# Patient Record
Sex: Male | Born: 1935
Health system: Southern US, Community
[De-identification: ages and names within clinical notes are randomized; demographics above are authoritative.]

## PROBLEM LIST (undated history)

## (undated) DIAGNOSIS — K219 Gastro-esophageal reflux disease without esophagitis: Secondary | ICD-10-CM

## (undated) DIAGNOSIS — I209 Angina pectoris, unspecified: Secondary | ICD-10-CM

## (undated) DIAGNOSIS — E785 Hyperlipidemia, unspecified: Secondary | ICD-10-CM

## (undated) DIAGNOSIS — K922 Gastrointestinal hemorrhage, unspecified: Secondary | ICD-10-CM

## (undated) DIAGNOSIS — I10 Essential (primary) hypertension: Secondary | ICD-10-CM

## (undated) DIAGNOSIS — R0609 Other forms of dyspnea: Secondary | ICD-10-CM

## (undated) DIAGNOSIS — M1990 Unspecified osteoarthritis, unspecified site: Secondary | ICD-10-CM

## (undated) DIAGNOSIS — J189 Pneumonia, unspecified organism: Secondary | ICD-10-CM

## (undated) DIAGNOSIS — I6529 Occlusion and stenosis of unspecified carotid artery: Secondary | ICD-10-CM

## (undated) DIAGNOSIS — N400 Enlarged prostate without lower urinary tract symptoms: Secondary | ICD-10-CM

## (undated) DIAGNOSIS — K5790 Diverticulosis of intestine, part unspecified, without perforation or abscess without bleeding: Secondary | ICD-10-CM

## (undated) DIAGNOSIS — I2510 Atherosclerotic heart disease of native coronary artery without angina pectoris: Secondary | ICD-10-CM

## (undated) HISTORY — PX: COLONOSCOPY: SHX174

## (undated) HISTORY — DX: Atherosclerotic heart disease of native coronary artery without angina pectoris: I25.10

## (undated) HISTORY — DX: Occlusion and stenosis of unspecified carotid artery: I65.29

## (undated) HISTORY — DX: Benign prostatic hyperplasia without lower urinary tract symptoms: N40.0

## (undated) HISTORY — DX: Gastro-esophageal reflux disease without esophagitis: K21.9

## (undated) HISTORY — DX: Unspecified osteoarthritis, unspecified site: M19.90

## (undated) HISTORY — PX: TRANSURETHRAL RESECTION OF PROSTATE: SHX73

## (undated) HISTORY — DX: Hyperlipidemia, unspecified: E78.5

## (undated) HISTORY — DX: Essential (primary) hypertension: I10

## (undated) HISTORY — PX: CYSTECTOMY: SUR359

## (undated) HISTORY — DX: Angina pectoris, unspecified: I20.9

---
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
## 1999-05-02 ENCOUNTER — Encounter: Payer: Self-pay | Admitting: Family Medicine

## 1999-05-02 ENCOUNTER — Encounter: Admission: RE | Admit: 1999-05-02 | Discharge: 1999-05-02 | Payer: Self-pay | Admitting: Family Medicine

## 2003-03-08 ENCOUNTER — Inpatient Hospital Stay (HOSPITAL_COMMUNITY): Admission: EM | Admit: 2003-03-08 | Discharge: 2003-03-13 | Payer: Self-pay | Admitting: Emergency Medicine

## 2005-04-07 ENCOUNTER — Ambulatory Visit: Payer: Self-pay | Admitting: Gastroenterology

## 2005-04-22 ENCOUNTER — Ambulatory Visit: Payer: Self-pay | Admitting: Gastroenterology

## 2005-04-22 ENCOUNTER — Encounter (INDEPENDENT_AMBULATORY_CARE_PROVIDER_SITE_OTHER): Payer: Self-pay | Admitting: Specialist

## 2006-04-06 ENCOUNTER — Ambulatory Visit: Payer: Self-pay | Admitting: *Deleted

## 2007-05-16 ENCOUNTER — Ambulatory Visit: Payer: Self-pay | Admitting: Cardiology

## 2007-05-20 ENCOUNTER — Encounter (INDEPENDENT_AMBULATORY_CARE_PROVIDER_SITE_OTHER): Payer: Self-pay | Admitting: Urology

## 2007-05-20 ENCOUNTER — Inpatient Hospital Stay (HOSPITAL_COMMUNITY): Admission: RE | Admit: 2007-05-20 | Discharge: 2007-05-22 | Payer: Self-pay | Admitting: Urology

## 2009-02-15 ENCOUNTER — Emergency Department (HOSPITAL_COMMUNITY): Admission: EM | Admit: 2009-02-15 | Discharge: 2009-02-15 | Payer: Self-pay | Admitting: Emergency Medicine

## 2010-03-25 ENCOUNTER — Encounter: Payer: Self-pay | Admitting: Gastroenterology

## 2010-04-03 NOTE — Letter (Signed)
Summary: Colonoscopy Letter  [facility]  [address] [place], [place] [address]   Phone: [phone]  Fax: [phone]      March 25, 2010 MRN: [id number]   [name] [address] [place], [place]  [address]   Dear Mr. Dwiggins,   According to your medical record, it is time for you to schedule a Colonoscopy. The American Cancer Society recommends this procedure as a method to detect early colon cancer. Patients with a family history of colon cancer, or a personal history of colon polyps or inflammatory bowel disease are at increased risk.  This letter has been generated based on the recommendations made at the time of your procedure. If you feel that in your particular situation this may no longer apply, please contact our office.  Please call our office at [phone] to schedule this appointment or to update your records at your earliest convenience.  Thank you for cooperating with [place] to provide you with the very best care possible.   Sincerely,  Judie Petit T. Russella Dar, M.D.  [facility] Gastro[facility]ology Division [phone]

## 2010-07-15 NOTE — Assessment & Plan Note (Signed)
[facility]                            CARDIOLOGY OFFICE NOTE   NAME:Kenneth, [name]                          MRN:          [id number]  DATE:05/16/2007                            DOB:          [birth year]    PRIMARY:  Mosetta Putt, M.D.   REASON FOR REFERRAL:  Preoperative evaluation in a patient with known  coronary disease.   HISTORY OF PRESENT ILLNESS:  The patient is a pleasant 78 year old  gentleman who had angina back in 1980.  He was cathed in [place].  He  had luminal irregularities in the LAD and circumflex, but he had a  totally occluded right coronary artery.  This was a dominant vessel with  some collaterals.  He had a well-preserved ejection fraction.  Over the  years, he has been managed medically, and has had no further angina by  his report.  He walks two miles briskly each day.  He may have a little  dyspneic climbing up hills, but this is not new.  He has no chest  pressure, neck, or arm discomfort.  He has no resting shortness of  breath, PND, or orthopnea.  He has had no palpitations, presyncope, or  syncope.  He has had an excellent exercise tolerance.  He said he had a  stress test in the mid-1980s, but has not required any other testing  since then.  He has his risk factors modified by Dr. Duaine Dredge.   The patient is having problems urinating.  He currently has an  indwelling Foley, and is due to have an apparent TURP done by Dr. Earlene Plater.   PAST MEDICAL HISTORY:  1. Hypertension times 20-30 years.  2. Hyperlipidemia.  3. Gout.  4. Traumatic eye injury as a child.  5. Mild bilateral carotid stenosis (20%-39% bilateral).   PAST SURGICAL HISTORY:  1. Eye surgeries.  2. Cyst removed from his neck.   ALLERGIES:  None.   MEDICATIONS:  1. Xalatan 0.005.  2. Finasteride 5 mg nightly.  3. Flomax 0.4 mg nightly.  4. Simvastatin 80 mg nightly.  5. Lisinopril 20 mg daily.  6. Allopurinol 300 mg daily.  7. Fluticasone 50 mcg  b.i.d.  8. Nitrofurantoin.   SOCIAL HISTORY:  The patient is retired.  He is married.  He has one son  and two stepchildren.  He does not smoke cigarettes.  He rarely drinks  alcohol.   FAMILY HISTORY:  Noncontributory for early coronary artery disease.   REVIEW OF SYSTEMS:  As stated in the HPI positive for occasional  diarrhea, occasional leg cramps, joint pains in his knees.  Negative for  all other systems.   PHYSICAL EXAMINATION:  The patient is in no distress.  Blood pressure 161/88, heart rate 61 and regular.  HEENT:  Eyelids are unremarkable.  The left pupil is round and reactive.  The right eye is status post from trauma with chronic changes.  Left  fundi is unremarkable.  Oral mucosa unremarkable.  NECK:  No jugular distention at 45 degrees.  Carotid upstroke brisk and  symmetrical.  No bruits, thyromegaly.  LYMPHATICS:  No cervical, axillary, or inguinal adenopathy.  LUNGS:  Clear to auscultation bilaterally.  BACK:  No costovertebral tenderness.  CHEST:  Unremarkable.  HEART:  PMI not displaced or sustained, S1-S2 within normal limits, no  S3, no S4, no clicks, no rubs, no murmurs.  ABDOMEN:  Flat, positive bowel sounds.  Normal in frequency and pitch.  No bruits, rebound, guarding or midline pulsatile mass.  No hepatomegaly  or splenomegaly.  SKIN:  No rashes, no nodules.  EXTREMITIES:  2+ pulses throughout, no edema, no cyanosis or clubbing.  NEURO:  Oriented to person, place, and time.  Cranial nerves II-XII  grossly intact, motor grossly intact.   EKG sinus bradycardia, rate 57, axis within normal limits, intervals  within normal limits, no acute ST-T wave changes.   ASSESSMENT AND PLAN:  1. Coronary artery disease.  The patient has coronary disease as      described.  He is very active.  He has no symptoms.  He is      participating in secondary risk reduction.  No further      cardiovascular testing is suggested.  I did discuss with him the      fact that  if he ever has any chest discomfort, decreased exercise      tolerance, dyspnea, or other even mildly worrisome symptoms; I      would have a low threshold for stress testing.  I will defer this      to Dr. Duaine Dredge.  2. Preoperative clearance.  Based on ACC/AHA guidelines, the patient      is an acceptable risk for the planned procedure.  This is a low-      risk procedure.  He has no high risk features.  He has a very high      functional level (much greater than 5 METS).  He can proceed to      surgery without further evaluation.  There would be no clear      advantage to adding a beta blocker, at this point, according to      most recent data.  3. Hypertension.  Blood pressure is slightly elevated here; however,      he takes it at home and it is always in the 120s/70s.  It is      controlled per Dr. Geoffery Lyons notes.  I will make no changes to his      medications.  4. Dyslipidemia per Dr. Duaine Dredge.  He had an excellent lipid profile      most recently.  5. Followup.  I think that he can come back to this clinic as needed      with the low threshold for stress testing as indicated above.     Rollene Rotunda, MD, [facility]  Electronically Signed    JH/MedQ  DD: 05/16/2007  DT: 05/16/2007  Job #: [id number]   cc:   Mosetta Putt, M.D.  Ronald L. Earlene Plater, M.D.

## 2010-07-15 NOTE — Op Note (Signed)
[name], [name]                 ACCOUNT NO.:  [id number]   MEDICAL RECORD NO.:  [id number]          PATIENT TYPE:  INP   LOCATION:  0005                         FACILITY:  [facility]   PHYSICIAN:  Ronald L. Earlene Plater, M.D.  DATE OF BIRTH:  06/08/1930   DATE OF PROCEDURE:  05/20/2007  DATE OF DISCHARGE:                               OPERATIVE REPORT   PREOPERATIVE DIAGNOSIS:  Benign prostatic hyperplasia with urinary  obstruction/retention.   POSTOPERATIVE DIAGNOSIS:  Benign prostatic hyperplasia with urinary  obstruction/retention.   PROCEDURE:  1. Cystourethroscopy.  2. Transurethral resection of the prostate.   ATTENDING PHYSICIAN:  Dr. Gaynelle Arabian.   RESIDENT PHYSICIAN:  Dr. Nira Conn.   ANESTHESIA:  General.   INDICATIONS FOR PROCEDURE:  Mr. Mcnay is a 77 year old, white male with  known benign prostatic hyperplasia.  He has failed medical management  with Flomax and finasteride. He has experienced acute urinary retention  and failed multiple voiding trials. He has seen Dr. Lorin Picket MacDiarmid in  a preoperative consultation referred by Dr. Earlene Plater and they have agreed  that Mr. Arabie may benefit from TURP.  This was discussed with Mr. Corsino  at great length. The risks, consequences and benefits were reviewed and  informed consent was obtained preoperatively.   DESCRIPTION OF PROCEDURE:  The patient was brought to the operating  room, placed in the supine position.  He was correctly identified by his  wristband and an appropriate time-out was taken. IV antibiotics were  delivered.  General anesthesia was delivered. He was adequately  anesthetized. He was placed in the dorsal lithotomy position with great  care taken to minimize the risk of peripheral neuropathy or compartment  syndrome.  His perineum was prepped and draped sterilely.  We began our  procedure by performing rigid cystourethroscopy with a 22-French rigid  cystoscopic sheath and a 12 degree lens.  The anterior  urethra was  normal in course and caliber. Upon entering into the prostatic urethra,  bilobar lateral lobe enlargement was noted with the gland coapting in  the midline. The mucosa was slightly erythematous secondary to previous  indwelling catheter. Upon entering the bladder, clear urine was  identified. His bladder was trabeculated but no obvious diverticula were  seen.  Ureteral orifices were noted to be in their normal anatomic  position effluxing clear urine.  He did have a small hint of a median  lobe but nothing significant.  Likewise the cystoscope was removed and  we performed urethral dilation using the [employer] sounds and  sequentially dilated him up to a 30-French.  Once adequately dilated, we  then placed our 24-French resectoscope with loop into the bladder.  Glycine was our irrigant.  We backed the resectoscope back to the  verumontanum and did a comprehensive evaluation of the prostate.  The  verumontanum was easily identified. We began our resection by taking  down the small median lobe and resecting a trough at the 6 o'clock  position.  This was resected down to the level of the capsule.  We then  identified a small lip of tissue  in the 12 o'clock position and this was  also resected.  Both lateral lobes were released with resection troughs  in the 1 and 11 o'clock positions.  We then performed systematic  resection circumferentially of the patient's gland beginning on his left  and then moving to his right.  All adenomatous material was resected  down to the level of the prostatic capsule.  All bleeding areas were  fulgurated.  Once the resection was complete, we irrigated his bladder  free of prostate chips, inspected his bladder. Both ureteral orifices  continued to efflux clear urine and they were not injured in the  resection. The resectoscope was backed in the prostatic fossa. There did  appear to be a very minor perforation in the 10 o'clock position.  All   other bleeding areas were fulgurated.  Final inspection did not reveal  any remaining prostatic chips and we subsequently removed the  resectoscope.  We placed a 26-French three-way catheter into the  patient's bladder, inflated the balloon with 35 mL of sterile water.  We  then placed him on traction and hand irrigated him until clear.  He did  have some bleeding prior to the traction being placed but he readily  cleared with the traction.  Continuous bladder irrigation was started in  the operating room.  This marked the end of our procedure.  Anesthesia  was reversed.  He was extubated and taken to the recovery room in stable  condition.  There were no complications.  Estimated blood loss was 100  mL. Resection time was 30 minutes. Note Dr. Earlene Plater was present and  participated in all aspects of the case.     ______________________________  Nira Conn, Resident      Lucrezia Starch. Earlene Plater, M.D.  Electronically Signed    DW/MEDQ  D:  05/20/2007  T:  05/20/2007  Job:  [id number]

## 2010-07-18 NOTE — H&P (Signed)
NAMEMALIKHI, OGAN NO.:  [id number]   MEDICAL RECORD NO.:  [id number]                   PATIENT TYPE:  EMS   LOCATION:  ED                                   [facility]ACILITY[facility]  [facility]   PHYSICIAN:  Renato Battles, M.D.                  DATE OF BIRTH:  03-Jan-1931   DATE OF ADMISSION:  03/08/2003  DATE OF DISCHARGE:                                HISTORY & PHYSICAL   PRIMARY CARE PHYSICIAN:  Mosetta Putt, M.D.   REASON FOR ADMISSION:  Dysuria.   HISTORY OF PRESENT ILLNESS:  The patient is a pleasant 77 year old white  male with a 48-hour history of urinary retention, dysuria, and change in  urine color, decreased appetite, and also reported 24-hour history of fever  up to 104, hematuria, and diarrhea. The patient denies cough or shortness of  breath.   REVIEW OF SYSTEMS:  CONSTITUTIONAL: Positive for fever. No chills, no  nightsweats. Positive for decreased appetite, no weight changes.  GI: No  nausea, vomiting. No constipation. Positive for diarrhea. CARDIOPULMONARY:  No chest pain, shortness of breath. No PND or orthopnea. No cough.  GU:  Positive for dysuria, hematuria, and urinary retention.   PAST MEDICAL HISTORY:  1. Hypertension.  2. Gastroesophageal reflux disorder.  3. Hypercholesterolemia.  4. Gout.  5. Coronary artery disease.  6. History of prostatitis and BPH.   PAST SURGICAL HISTORY:  1. 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  2. Cardiac catheterization in the 1980s showed obstruction, 90% of one     artery which the patient does not recall. However, he was told then that     he has good collaterals and does not need any treatment.   FAMILY HISTORY:  Positive for prostate cancer.   SOCIAL HISTORY:  No tobacco, alcohol, or drugs. He lives with his wife.   ALLERGIES:  No known drug allergies.   HOME MEDICATIONS:  1. Lisinopril 20 mg p.o. daily.  2. Lipitor 20 mg p.o. daily.  3. Chlortalidone 25 mg p.o.  daily.  4. Allopurinol 300 mg p.o. daily.  5. Zantac 150 mg p.o. b.i.d.  6. Flonase.   PHYSICAL EXAMINATION:  GENERAL: The patient is alert and oriented times  three, in no acute distress.  VITAL SIGNS: Temperature 100.7, heart rate 120, respiratory rate 20, blood  pressure 80/43.  HEENT: The head is normocephalic and atraumatic. The left pupil is reactive  to light and accommodation. The right eye is blind. The patient has very dry  mucous membranes.  NECK: No lymphadenopathy, thyromegaly, and no jugular venous distention.  CHEST: Clear to auscultation bilaterally; however, there are decreased  breath sounds in the right lower lobe posteriorly.  ABDOMEN: Soft, nontender, and nondistended.  Normoactive bowel sounds.  EXTREMITIES: No clubbing, cyanosis, or edema.   STUDIES:  CBC shows white count 22.2 with 91% neutrophils, hemoglobin and  platelets normal. Electrolytes show low potassium of 2.9, elevated BUN at 26  and creatinine 2.1; otherwise insignificant. Urinalysis shows large white  cells, negative nitrites, and large blood.   ASSESSMENT/PLAN:  1. Urinary tract infection. I do not believe at this point that the patient     is uroseptic. He appears to be very stable. Blood pressure is low     probably secondary to acute renal failure and continuation of     antihypertensives in addition to dehydration. I am going to start the     patient on Rocephin. Blood culture and urine culture are being done and     they are pending.  2. Acute renal failure. This is likely secondary to dehydration, less likely     secondary to obstruction. The patient also received a Foley catheter. We     are starting him on IV fluids. Creatinine clearance at this point     calculate at 38.  3. Hypertension. Currently, the patient is hypotensive. I am going to hold     the medication.  4. Gastroesophageal reflux disorder. The patient will be started on Protonix     p.o.  5. Gout. I am going to continue  the patient on allopurinol. However, because     of acute renal failure I am going to decrease the dose from 300 to 150     daily.  6. Hypercholesterolemia.  Continue the patient on Lipitor.  7. Family history of prostate cancer. I am going to check PSA.  8. Hypokalemia. The patient will receive modest dose of potassium in his IV     fluids.                                               Renato Battles, M.D.    SA/MEDQ  D:  03/08/2003  T:  03/08/2003  Job:  [id number]

## 2010-07-18 NOTE — Discharge Summary (Signed)
[name], [name]                             ACCOUNT NO.:  [id number]   MEDICAL RECORD NO.:  [id number]                   PATIENT TYPE:  INP   LOCATION:  0478                                 FACILITY:  [facility]   PHYSICIAN:  Elliot Cousin, M.D.                 DATE OF BIRTH:  [birth year]   DATE OF ADMISSION:  03/08/2003  DATE OF DISCHARGE:  03/13/2003                                 DISCHARGE SUMMARY   PRIMARY DISCHARGE DIAGNOSES:  1. Escherichia coli urinary tract infection with sepsis.  2. Escherichia coli bacteremia.  3. Urinary retention.  4. Elevated prostate-specific antigen.  5. Elevated liver transaminases.  6. Acute renal insufficiency, nonoliguric.   SECONDARY DISCHARGE DIAGNOSES:  1. History of hypertension.  2. Gastroesophageal reflux disease.  3. Hyperlipidemia.  4. Gout.  5. Coronary artery disease.  6. History of prostatitis and benign prostatic hypertrophy.  7. 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  8. Cardiac catheterization in the 1980s, showing a 90% stenosis of one     artery, which the patient did not recall.  However, he was told that he     had good collaterals and did not need angioplasty.   DISCHARGE MEDICATIONS:  1. Hold lisinopril for 1 week.  2. Resume Lipitor 20 mg q.h.s. in 1 week.  3. Allopurinol 300 mg daily.  4. Zantac 150 mg b.i.d.  5. Flonase 1 spray in each nostril b.i.d.  6. Cipro 500 mg b.i.d. x1 week.  7. Flomax 0.4 mg daily.   DISCHARGE DISPOSITION:  [name] was discharged to home on March 13, 2003, in improved and stable condition.  He was advised to follow up with  his primary care physician, Dr. Duaine Dredge, on Friday, March 16, 2003, at  4:30 p.m.   CONSULTATIONS:  Dr. Gaynelle Arabian.   PROCEDURES PERFORMED:  1. Renal ultrasound on March 09, 2003.  The results revealed small     bilateral simple renal cysts.  Empty urinary bladder.  No acute     abnormalities.  Right  kidney measuring 12.2 cm in length and the left     kidney measuring 12 cm in length.  2. Ultrasound of the abdomen on March 13, 2003.  The results revealed no     evidence of biliary or hepatic disease.   HISTORY OF PRESENT ILLNESS:  [name] is a 77 year old man with a past  medical history significant for prostatitis and BPH who presented to the  emergency department with a 48-hour history of urinary retention, painful  urination, a change in urine color, decreased appetite, and fever up to 104.  The patient also had evidence of gross hematuria and several episodes of  diarrhea.  The patient was subsequently admitted for evaluation and  treatment of urinary tract infection, possibly pyelonephritis versus UTI  with sepsis.  HOSPITAL COURSE:  #1 - ESCHERICHIA COLI URINARY TRACT INFECTION WITH SEPSIS  AND WITH ESCHERICHIA COLI BACTEREMIA:  The initial management included  volume repletion with D-5 normal saline at 100 mL an hour, treatment with  Rocephin at 1 g IV q.24h., obtaining culture and sensitivities on the urine,  obtaining a PSA, obtaining blood cultures, and obtaining a renal ultrasound.  The patient's blood work on admission was significant for a WBC of 22.2  thousand, hemoglobin of 13.4, platelets of 147, an absolute neutrophil count  of 20.2.  Potassium 2.9, sodium 135, CO2 of 28, chloride of 103, creatinine  of 2.1, BUN of 26, glucose of 116, calcium of 7.8.  AST of 30, ALT of 15,  alkaline phosphatase of 68, total bilirubin of 0.9.  The follow-up  laboratory results were significant for an elevated PSA of 102.3 with a free  PSA of 49.1, significantly elevated.  The patient's initial urinalysis  revealed large hemoglobin, trace of ketones, positive nitrites, large  leukocyte esterase, and many bacteria.  The patient was febrile with a  temperature of 101.6 in the emergency department.   On hospital day #2 Cipro at 400 mg IV q.12h. was added to Rocephin.  The  patient  quickly became afebrile on hospital day #2.  His urine culture  became positive for E. coli.  Shortly thereafter the blood cultures became  positive for E. coli.  On hospital day #3 the patient became febrile again.  The Foley catheter which was inserted on admission was discontinued because  it was thought to be a continuous nidus for infection.  Urinary retention  was a concern, and his urine output was monitored following the  discontinuation of the Foley.  The patient's blood pressures were low to  normal systolically during the hospital course.  The patient has a history  of hypertension; however, he was relatively hypotensive, which was felt to  be secondary to the overwhelming infection.  His blood pressure medications  were held during the hospital course.  The patient's thrombocytopenia was  thought to be secondary to the acute infection as well.  He became  symptomatically much improved following the addition of ciprofloxacin to the  antibiotic regimen.  His appetite improved, and he began drinking more  fluids.  Therefore, the IV fluids were discontinued on hospital day #3.  Dr.  Earlene Plater, urologist, was consulted for further evaluation and management.  He  agreed with the current medical management.  He felt that the patient's  elevated PSA was probably secondary to the urinary tract infection and the  Foley catheter.  He recommended following the PSA as an outpatient.   The patient's renal function was followed during the hospitalization very  closely.  The patient had no past medical history significant for renal  insufficiency.  However, on admission his creatinine was 2.1, and his BUN  was 26.  Following treatment with antibiotics and intravenous fluids, the  creatinine normalized to 1, and the BUN normalized to 11.  The platelet  count returned to within normal limits at 151,000 as well.  He was repleted with potassium chloride intravenously and by mouth during the hospital   course.  The potassium, which was 2.9 on admission, improved to 4.1 prior to  hospital discharge.  The patient had an excellent urinary output during the  entire hospitalization.  There were no signs of oliguria or anuria.  The  patient demonstrated no urinary retention following the discontinuation of  the Foley catheter.  The intravenous Rocephin and the intravenous Cipro were  eventually discontinued.  The patient was subsequently sent home on  ciprofloxacin 500 mg p.o. b.i.d.  He will continue this regimen for an  additional 7 days.  He was advised to follow up with his primary care  physician, Dr. Duaine Dredge, in 1-2 weeks and with his urologist as needed.  Prior to hospital discharge the patient's white blood cell count  dramatically improved from greater than 22,000 to 5700 at the time of  discharge.   #2 - ELEVATED LIVER TRANSAMINASES:  The patient's liver transaminases were  within normal limits on admission with an AST of 30, ALT of 15, alkaline  phosphatase 68, total bilirubin 0.9.  However, following treatment with  Rocephin and ciprofloxacin the AST increased to 53, and the ALT increased to  44.  The alkaline phosphatase remained within normal limits at 117 as well  as the total bilirubin at 0.4.  An ultrasound of the abdomen was ordered for  further evaluation of the right upper quadrant.  The ultrasound was  essentially negative with no evidence of biliary or hepatic disease.  The  mild increases in the levels of ALT and AST were attributed to antibiotic  therapy and possibly Lipitor.  He had no complaints of right upper quadrant  abdominal pain during the exam.   DISCHARGE LABORATORIES:  WBC 5.7, hemoglobin 13.5, hematocrit 40.4, MCV  93.3, platelets 151.  Sodium 135, potassium 4.1, chloride 103, CO2 27,  glucose 130, BUN 11, creatinine 1, calcium 8.3, total protein 5.7, albumin  2.7, AST 53, ALT 44, alkaline phosphatase 117, total bilirubin 0.4,  magnesium 1.8, total  cholesterol 98, HDL cholesterol 32, LDL cholesterol 51.                                               Elliot Cousin, M.D.    DF/MEDQ  D:  03/19/2003  T:  03/20/2003  Job:  [id number]   cc:   Mosetta Putt, M.D.  [address] [place]  [place] [address]  Fax: [phone]   Lucrezia Starch. Ovidio Hanger, M.D.  [address]  [place]  [place] [address]  Fax: [phone]

## 2010-07-18 NOTE — Discharge Summary (Signed)
NAMEPROPHET, RENWICK                 ACCOUNT NO.:  [id number]   MEDICAL RECORD NO.:  [id number]          PATIENT TYPE:  INP   LOCATION:  1413                         FACILITY:  [facility]   PHYSICIAN:  Ronald L. Earlene Plater, M.D.  DATE OF BIRTH:  Mar 12, 1930   DATE OF ADMISSION:  05/20/2007  DATE OF DISCHARGE:  05/22/2007                               DISCHARGE SUMMARY   ADMISSION DIAGNOSES:  1. Benign prostatic hypertrophy with urinary obstruction.  2. Retention of urine.   DISCHARGE DIAGNOSIS:  Status post transurethral prostatectomy.   BRIEF HISTORY:  Mr. Casasola is a 77 year old male who is currently Foley  dependent.  He did undergo urodynamic study and was also seen in  consultation by Dr. Sherron Monday.  It was felt that the best possibility  for urinating on his own without catheterization or tube dependency was  a TURP.  After a lengthy discussion with Dr. Darvin Neighbours, and understanding the  risks, benefits, and alternatives, he elected to proceed with cystoscopy  and TURP.  The surgery will be cleared with Dr. Duaine Dredge.   PAST MEDICAL HISTORY:  1. Hypertension.  2. Glaucoma.  3. Coronary artery disease.   ALLERGIES:  None known.   HOSPITAL COURSE:  On May 20, 2007, Mr. Midgley was electively admitted  to [facility] under the care of Dr. Gaynelle Arabian.  He  underwent the following surgical procedures, cysto and TURP.  He  tolerated the procedure well, transferring in stable condition to the  PACU.  He was extubated immediately following surgery and awoke from  surgery neurologically intact.    Postoperatively, the patient's vital signs remained stable and afebrile.  His urine cleared.  His abdomen was soft.  The Foley catheter was  discontinued on May 22, 2007.  The patient was discharged home.   LABORATORY STUDIES:  White count 6.3, hemoglobin 13.3, hematocrit 39.3.  Sodium 132, potassium 3.9, CO2 99, chloride 29, glucose 116, BUN 9,  creatinine 0.87.   DISCHARGE  MEDICATIONS:  1. Cipro.  2. Vicodin p.r.n.  3. Xalatan one drop left eye at bedtime.  4. Istalol one drop left eye every morning.  5. Finasteride 5 mg nightly.  6. Flomax 0.4 mg nightly.  7. Simvastatin 80 mg one daily.  8. Lisinopril 20 mg daily.  9. Allopurinol 300 mg daily.  10.Fluocinonide topical for psoriasis twice daily.  11.Fluticasone twice daily as needed and one spray in each nostril.   ACTIVITY:  Increase slowly, may go up steps, no lifting greater than 5  pounds for 3 weeks, may shower, no driving for 3 weeks, no sexual  activity for 3 weeks.   DIET:  Low sodium, heart healthy.   FOLLOWUPAlessandra Bevels. Lineberry, FNP-C in 2 weeks.     ______________________________  Alessandra Bevels. Chase Picket, FNP-C      Ronald L. Earlene Plater, M.D.  Electronically Signed    JML/MEDQ  D:  06/09/2007  T:  06/09/2007  Job:  [id number]

## 2010-09-25 ENCOUNTER — Ambulatory Visit (AMBULATORY_SURGERY_CENTER): Payer: Medicare Other | Admitting: *Deleted

## 2010-09-25 DIAGNOSIS — Z86010 Personal history of colonic polyps: Secondary | ICD-10-CM

## 2010-09-25 DIAGNOSIS — Z1211 Encounter for screening for malignant neoplasm of colon: Secondary | ICD-10-CM

## 2010-09-25 MED ORDER — PEG-KCL-NACL-NASULF-NA ASC-C 100 G PO SOLR: 1.0000 | Freq: Once | ORAL | Status: DC

## 2010-09-25 NOTE — Patient Instructions (Signed)
Please review instructions  Pick up moviprep at [facility] on [address]

## 2010-10-03 ENCOUNTER — Encounter: Payer: Self-pay | Admitting: Gastroenterology

## 2010-10-03 ENCOUNTER — Ambulatory Visit (AMBULATORY_SURGERY_CENTER): Payer: Medicare Other | Admitting: Gastroenterology

## 2010-10-03 DIAGNOSIS — Z1211 Encounter for screening for malignant neoplasm of colon: Secondary | ICD-10-CM

## 2010-10-03 DIAGNOSIS — Z860100 Personal history of colon polyps, unspecified: Secondary | ICD-10-CM

## 2010-10-03 MED ORDER — SODIUM CHLORIDE 0.9 % IV SOLN: 500.0000 mL | INTRAVENOUS | Status: DC

## 2010-10-03 NOTE — Patient Instructions (Signed)
Please refer to your blue and neon green sheets for instructions regarding diet and activity for the rest of today.  You may resume your medications as you would normally take them.  

## 2010-10-06 ENCOUNTER — Telehealth: Payer: Self-pay | Admitting: *Deleted

## 2010-10-06 NOTE — Telephone Encounter (Signed)
Message left

## 2010-11-24 LAB — COMPREHENSIVE METABOLIC PANEL
Albumin: 4
Alkaline Phosphatase: 86
BUN: 12
CO2: 27
Chloride: 103
GFR calc non Af Amer: >60
Potassium: 3.4 — ABNORMAL LOW
Total Bilirubin: 0.8

## 2010-11-24 LAB — URINALYSIS, ROUTINE W REFLEX MICROSCOPIC
Bilirubin Urine: NEGATIVE
Hgb urine dipstick: NEGATIVE
Ketones, ur: NEGATIVE
Specific Gravity, Urine: 1.009
pH: 7.5

## 2010-11-24 LAB — BASIC METABOLIC PANEL
Calcium: 8.3 — ABNORMAL LOW
GFR calc Af Amer: >60
GFR calc non Af Amer: >60
Sodium: 132 — ABNORMAL LOW

## 2010-11-24 LAB — URINE MICROSCOPIC-ADD ON

## 2010-11-24 LAB — CBC
HCT: 43.8
Hemoglobin: 15
Hemoglobin: 13.3
MCV: 92.3
Platelets: 168
RBC: 4.74
RBC: 4.23
WBC: 6.1
WBC: 6.3

## 2010-11-24 LAB — PROTIME-INR: INR: 1

## 2010-12-01 DIAGNOSIS — I209 Angina pectoris, unspecified: Secondary | ICD-10-CM

## 2010-12-01 HISTORY — PX: CORONARY ANGIOPLASTY WITH STENT PLACEMENT: SHX49

## 2010-12-01 HISTORY — DX: Angina pectoris, unspecified: I20.9

## 2010-12-16 ENCOUNTER — Encounter: Payer: Self-pay | Admitting: Cardiology

## 2010-12-17 ENCOUNTER — Encounter: Payer: Self-pay | Admitting: *Deleted

## 2010-12-17 ENCOUNTER — Encounter: Payer: Self-pay | Admitting: Cardiology

## 2010-12-17 ENCOUNTER — Ambulatory Visit (INDEPENDENT_AMBULATORY_CARE_PROVIDER_SITE_OTHER): Payer: Medicare Other | Admitting: Cardiology

## 2010-12-17 ENCOUNTER — Telehealth: Payer: Self-pay | Admitting: Cardiology

## 2010-12-17 DIAGNOSIS — I6529 Occlusion and stenosis of unspecified carotid artery: Secondary | ICD-10-CM | POA: Insufficient documentation

## 2010-12-17 DIAGNOSIS — E785 Hyperlipidemia, unspecified: Secondary | ICD-10-CM | POA: Insufficient documentation

## 2010-12-17 DIAGNOSIS — Z01810 Encounter for preprocedural cardiovascular examination: Secondary | ICD-10-CM

## 2010-12-17 DIAGNOSIS — I10 Essential (primary) hypertension: Secondary | ICD-10-CM | POA: Insufficient documentation

## 2010-12-17 DIAGNOSIS — I2510 Atherosclerotic heart disease of native coronary artery without angina pectoris: Secondary | ICD-10-CM | POA: Insufficient documentation

## 2010-12-17 LAB — BASIC METABOLIC PANEL
BUN: 18 mg/dL (ref 6–23)
CO2: 27 mEq/L (ref 19–32)
Chloride: 100 mEq/L (ref 96–112)
Glucose, Bld: 92 mg/dL (ref 70–99)
Potassium: 4.1 mEq/L (ref 3.5–5.1)

## 2010-12-17 LAB — CBC WITH DIFFERENTIAL/PLATELET
Eosinophils Relative: 5.8 % — ABNORMAL HIGH (ref 0.0–5.0)
HCT: 41.3 % (ref 39.0–52.0)
Lymphs Abs: 1.2 10*3/uL (ref 0.7–4.0)
MCV: 97.5 fl (ref 78.0–100.0)
Monocytes Absolute: 0.8 10*3/uL (ref 0.1–1.0)
Platelets: 193 10*3/uL (ref 150.0–400.0)
RDW: 14 % (ref 11.5–14.6)
WBC: 7.7 10*3/uL (ref 4.5–10.5)

## 2010-12-17 LAB — PROTIME-INR: Prothrombin Time: 10.9 s (ref 10.2–12.4)

## 2010-12-17 MED ORDER — METOPROLOL SUCCINATE ER 25 MG PO TB24: 25.0000 mg | ORAL_TABLET | Freq: Every day | ORAL | Status: DC

## 2010-12-17 MED ORDER — ISOSORBIDE MONONITRATE ER 30 MG PO TB24: 30.0000 mg | ORAL_TABLET | Freq: Every day | ORAL | Status: DC

## 2010-12-17 MED ORDER — METOPROLOL SUCCINATE ER 25 MG PO TB24: 25.0000 mg | ORAL_TABLET | Freq: Two times a day (BID) | ORAL | Status: DC

## 2010-12-17 NOTE — Telephone Encounter (Signed)
Pt wants [place] to know that he uses [facility] for long term RXs.  Explained to pt that at this time the RXs sent today may not end up being long term and we will know more after his cardiac cath scheduled for Monday.  He said understanding.

## 2010-12-17 NOTE — Assessment & Plan Note (Signed)
The patient has known CAD and new onset exertional angina.  Cardiac cath is needed.  I will start Imdur 30 mg and Metoprolol 25 mg bid.  He has SL NTG and he will rest this weekend.  He will come to the emergency room with any further pain.  I will schedule outpatient cardiac cath on Monday 10/22.

## 2010-12-17 NOTE — Telephone Encounter (Signed)
Patient calling wants to discuss Rx  he just got filled.

## 2010-12-17 NOTE — Assessment & Plan Note (Signed)
He will have follow up carotid Dopplers scheduled for a date in the future.

## 2010-12-17 NOTE — Assessment & Plan Note (Signed)
His blood pressure with be treated in the context of treating his CAD

## 2010-12-17 NOTE — Progress Notes (Signed)
HPI The patient presents for evaluation of chest discomfort. I saw him some years ago.  In the 1980s he had an occluded right coronary artery with catheterization in [place]. He was managed medically. He has done well all of these years. However, about one week ago he developed chest discomfort with exercise. This has been the same as previous angina. It is substernal. It is moderate. There is no radiation and no associated symptoms. He took some old nitroglycerin and thought this helped. He goes away after about 5 minutes. It is not happening at rest. It seems to be getting worse over the past week though again no symptoms unless he is exerting himself. He has had no fevers or chills. He has had no PND or orthopnea. He has no weight gain or edema.  No Known Allergies  Current Outpatient Prescriptions  Medication Sig Dispense Refill  . allopurinol (ZYLOPRIM) 300 MG tablet 1 tablet Daily.      Marland Kitchen aspirin 325 MG EC tablet Take 325 mg by mouth daily.        . betamethasone, augmented, (DIPROLENE) 0.05 % gel Apply topically 2 (two) times daily. Apply twice daily 2 weeks per month       . DICLOFENAC SODIUM PO Take 35 mg by mouth every 8 (eight) hours.        . finasteride (PROSCAR) 5 MG tablet 1 tablet Daily.      . fluticasone (FLONASE) 50 MCG/ACT nasal spray 2 sprays twice daily each side      . hydrochlorothiazide 25 MG tablet 1 tablet Daily.      Marland Kitchen levobunolol (BETAGAN) 0.5 % ophthalmic solution 1 drop. Each drop into left eye each morning      . lisinopril (PRINIVIL,ZESTRIL) 40 MG tablet 1 tablet Daily.      . nitroGLYCERIN (NITROSTAT) 0.4 MG SL tablet Place 0.4 mg under the tongue every 5 (five) minutes as needed.        . ranitidine (ZANTAC) 150 MG tablet Take 150 mg by mouth as needed.        . simvastatin (ZOCOR) 80 MG tablet 1 tablet Daily.      . Tamsulosin HCl (FLOMAX) 0.4 MG CAPS 1 tablet Daily.      . TRAVATAN Z 0.004 % ophthalmic solution 1 drop Daily. At bedtime        Past Medical  History  Diagnosis Date  . Arthritis   . Blood transfusion   . Anginal pain   . GERD (gastroesophageal reflux disease)   . Glaucoma   . Hyperlipidemia   . Hypertension   . BPH (benign prostatic hyperplasia)   . CAD (coronary artery disease)     RCA occluded [facility] 1980s)  . Carotid stenosis     20 - 39% bilateral  . Gout     Past Surgical History  Procedure Date  . Colonoscopy   . Polypectomy   . Cyst removed     right cheek  . Eye muscle surgery     right  . Prostate surgery     TURP    Family History  Problem Relation Age of Onset  . Colon cancer Neg Hx   . Prostate cancer Father 29  . Cancer Mother     Stomach    History   Social History  . Marital Status: Married    Spouse Name: N/A    Number of Children: 3  . Years of Education: N/A   Occupational History  . Retired  Social History Main Topics  . Smoking status: Former Smoker -- 2.0 packs/day for 14 years    Types: Cigarettes    Quit date: 12/12/1966  . Smokeless tobacco: Not on file  . Alcohol Use: No  . Drug Use: No  . Sexually Active: Not on file   Other Topics Concern  . Not on file   Social History Narrative  . No narrative on file    ROS:  As stated in the HPI and negative for all other systems.  PHYSICAL EXAM BP 146/80  Pulse 61  Ht 5' 7.5" (1.715 m)  Wt 171 lb 12.8 oz (77.928 kg)  BMI 26.51 kg/m2 GENERAL:  Well appearing HEENT:  Right lens opacification, fundi not visualized, oral mucosa unremarkable NECK:  No jugular venous distention, waveform within normal limits, carotid upstroke brisk and symmetric, no bruits, no thyromegaly LYMPHATICS:  No cervical, inguinal adenopathy LUNGS:  Clear to auscultation bilaterally BACK:  No CVA tenderness CHEST:  Unremarkable HEART:  PMI not displaced or sustained,S1 and S2 within normal limits, no S3, no S4, no clicks, no rubs, no murmurs ABD:  Flat, positive bowel sounds normal in frequency in pitch, no bruits, no rebound, no  guarding, no midline pulsatile mass, no hepatomegaly, no splenomegaly EXT:  2 plus pulses throughout, no edema, no cyanosis no clubbing SKIN:  No rashes no nodules NEURO:  Cranial nerves II through XII grossly intact, motor grossly intact throughout PSYCH:  Cognitively intact, oriented to person place and time   EKG:  Sinus rhythm, rate 61, LAD, intervals within normal limits, no acute ST-T wave changes.   ASSESSMENT AND PLAN

## 2010-12-17 NOTE — Assessment & Plan Note (Signed)
I will defer to Dr. Wylene Simmer.  I will review the most recent lipids for my records.

## 2010-12-17 NOTE — Patient Instructions (Addendum)
Please start Metoprolol 25 mg twice a day Start Imdur (isosorbide 30 mg a day) Continue all other medications the same  Your physician has requested that you have a cardiac catheterization. Cardiac catheterization is used to diagnose and/or treat various heart conditions. Doctors may recommend this procedure for a number of different reasons. The most common reason is to evaluate chest pain. Chest pain can be a symptom of coronary artery disease (CAD), and cardiac catheterization can show whether plaque is narrowing or blocking your heart's arteries. This procedure is also used to evaluate the valves, as well as measure the blood flow and oxygen levels in different parts of your heart. For further information please visit [email or web address] Please follow instruction sheet, as given.

## 2010-12-22 ENCOUNTER — Inpatient Hospital Stay (HOSPITAL_COMMUNITY)
Admission: AD | Admit: 2010-12-22 | Discharge: 2010-12-24 | DRG: 247 | Disposition: A | Discharge status: Home / Self Care | Payer: Medicare Other | Source: Ambulatory Visit | Attending: Cardiovascular Disease | Admitting: Cardiovascular Disease

## 2010-12-22 ENCOUNTER — Inpatient Hospital Stay (HOSPITAL_BASED_OUTPATIENT_CLINIC_OR_DEPARTMENT_OTHER)
Admission: RE | Admit: 2010-12-22 | Discharge: 2010-12-22 | Disposition: A | Discharge status: Home / Self Care | Payer: Medicare Other | Source: Ambulatory Visit | Attending: Cardiovascular Disease | Admitting: Cardiovascular Disease

## 2010-12-22 DIAGNOSIS — I658 Occlusion and stenosis of other precerebral arteries: Secondary | ICD-10-CM | POA: Diagnosis present

## 2010-12-22 DIAGNOSIS — I2510 Atherosclerotic heart disease of native coronary artery without angina pectoris: Secondary | ICD-10-CM

## 2010-12-22 DIAGNOSIS — M109 Gout, unspecified: Secondary | ICD-10-CM | POA: Diagnosis present

## 2010-12-22 DIAGNOSIS — E785 Hyperlipidemia, unspecified: Secondary | ICD-10-CM | POA: Diagnosis present

## 2010-12-22 DIAGNOSIS — N400 Enlarged prostate without lower urinary tract symptoms: Secondary | ICD-10-CM | POA: Diagnosis present

## 2010-12-22 DIAGNOSIS — H409 Unspecified glaucoma: Secondary | ICD-10-CM | POA: Diagnosis present

## 2010-12-22 DIAGNOSIS — I209 Angina pectoris, unspecified: Secondary | ICD-10-CM | POA: Insufficient documentation

## 2010-12-22 DIAGNOSIS — I2582 Chronic total occlusion of coronary artery: Secondary | ICD-10-CM | POA: Insufficient documentation

## 2010-12-22 DIAGNOSIS — I6529 Occlusion and stenosis of unspecified carotid artery: Secondary | ICD-10-CM | POA: Diagnosis present

## 2010-12-22 DIAGNOSIS — Z79899 Other long term (current) drug therapy: Secondary | ICD-10-CM

## 2010-12-22 DIAGNOSIS — Z7982 Long term (current) use of aspirin: Secondary | ICD-10-CM

## 2010-12-22 DIAGNOSIS — I200 Unstable angina: Secondary | ICD-10-CM | POA: Diagnosis present

## 2010-12-22 DIAGNOSIS — I10 Essential (primary) hypertension: Secondary | ICD-10-CM | POA: Diagnosis present

## 2010-12-23 LAB — BASIC METABOLIC PANEL
BUN: 13 mg/dL (ref 6–23)
CO2: 26 mEq/L (ref 19–32)
Calcium: 8.9 mg/dL (ref 8.4–10.5)
Creatinine, Ser: 0.74 mg/dL (ref 0.50–1.35)
Glucose, Bld: 117 mg/dL — ABNORMAL HIGH (ref 70–99)

## 2010-12-23 LAB — HEPARIN LEVEL (UNFRACTIONATED): Heparin Unfractionated: <0.1 IU/mL — ABNORMAL LOW (ref 0.30–0.70)

## 2010-12-23 LAB — CBC
Hemoglobin: 12.5 g/dL — ABNORMAL LOW (ref 13.0–17.0)
MCH: 31.6 pg (ref 26.0–34.0)
MCHC: 34.1 g/dL (ref 30.0–36.0)

## 2010-12-24 DIAGNOSIS — I200 Unstable angina: Secondary | ICD-10-CM

## 2010-12-24 LAB — CBC
HCT: 33.2 % — ABNORMAL LOW (ref 39.0–52.0)
MCH: 31.5 pg (ref 26.0–34.0)
MCHC: 33.7 g/dL (ref 30.0–36.0)
MCV: 93.3 fL (ref 78.0–100.0)
RDW: 13.4 % (ref 11.5–15.5)

## 2010-12-24 LAB — BASIC METABOLIC PANEL
BUN: 11 mg/dL (ref 6–23)
Creatinine, Ser: 0.79 mg/dL (ref 0.50–1.35)
GFR calc Af Amer: >90 mL/min (ref >90)
GFR calc non Af Amer: 84 mL/min — ABNORMAL LOW (ref >90)

## 2010-12-25 NOTE — Cardiovascular Report (Signed)
[name], [name] NO.:  [id number]  MEDICAL RECORD NO.:  [id number]  LOCATION:  2504                         FACILITY:  [facility]  PHYSICIAN:  [name], MDDATE OF BIRTH:  18-Jan-1931  DATE OF PROCEDURE:  12/23/2010 DATE OF DISCHARGE:                           CARDIAC CATHETERIZATION   PRIMARY CARDIOLOGIST:  Rollene Rotunda, MD, [facility]  PROCEDURES PERFORMED: 1. PTCA with placement of a drug-eluting stent in the first obtuse     marginal branch of the circumflex artery. 2. Placement of an Angio-Seal femoral artery closure device in the     left femoral artery.  OPERATOR:  [name], MD  INDICATION:  This is a 76 year old Caucasian male with a history of hypertension, hyperlipidemia, coronary artery disease with a known totally occluded right coronary artery, who has had recent complaints of chest discomfort consistent with angina.  The patient underwent a diagnostic catheterization by Dr. Charlton Haws yesterday in the outpatient cath lab.  The patient was found to have a severe stenosis in the ostium of the first obtuse marginal branch of the circumflex artery. The patient was admitted overnight and hydrated.  I was asked to perform his intervention today.  PROCEDURE:  The patient was brought to the main cardiac catheterization laboratory after signing informed consent for the procedure.  An [name] test was performed on the right wrist that was positive.  The right wrist was prepped and draped in a sterile fashion.  Lidocaine 1% was used for local anesthesia.  A 6-French sheath was inserted into the right radial artery without difficulty.  Verapamil 3 mg was given through the sheath after insertion.  The patient was then started on an [name] drip after a bolus was given.  The patient had been previously loaded with Plavix 600 mg yesterday and received 75 mg of Plavix today. We initially easily passed the wire into the ascending aorta,  however, there was severe tortuosity of the right innominate artery.  We were able to pass an guiding catheter into the aortic root, but secondary to the more proximal tortuosity, we were unable to manipulate her guiding catheters.  At this point, I felt it was best to change our access site to the left femoral artery.  The sheath was flushed in the right radial artery and the guiding catheter was removed.  The left groin was prepped and draped in a sterile fashion.  Lidocaine 1% was used for local anesthesia.  A 6-French sheath was inserted into the left femoral artery without difficulty.  We then checked an ACT which was over 200.  The left main artery was engaged with XB 3.0 guiding catheter.  A cougar intracoronary wire was passed down the length of the circumflex artery out into the obtuse marginal branch.  A 2.5 x 12 mm balloon was used to dilate the lesion.  A 2.5 x 16 mm PROMUS element drug-eluting stent was then deployed in the mid circumflex artery extending down into the first obtuse marginal branch.  A 2.5 x 12 mm noncompliant balloon was taken to 19 atmospheres.  This was approximately 2.62-mm.  There was an excellent angiographic result with excellent flow into the distal vessel.  The stenosis was taken from 99% down to 0%.  There were no immediate complications.  An Angio-Seal femoral artery closure device was placed in the left femoral artery.  A [name] hemostasis band was applied over the arteriotomy site in the right radial artery.  The patient was taken to the recovery area in stable condition.  IMPRESSION:  Successful PTCA with placement of a drug-eluting stent extending from the mid circumflex down into the first obtuse marginal branch of the circumflex artery.  RECOMMENDATIONS:  The patient will be continued on aspirin, Plavix and a statin.  Beta-blocker therapy will be discussed with his primary cardiologist.     [name], MD     CM/MEDQ  D:   12/23/2010  T:  12/24/2010  Job:  [id number]  cc:   Rollene Rotunda, MD, [facility]  Electronically Signed by [name] MD on 12/25/2010 01:52:13 PM

## 2010-12-25 NOTE — Cardiovascular Report (Signed)
  NAMETHESEUS, [name]                 ACCOUNT NO.:  [id number]  MEDICAL RECORD NO.:  [id number]  LOCATION:  2037                         FACILITY:  [facility]  PHYSICIAN:  Noralyn Pick. Eden Emms, MD, FACCDATE OF BIRTH:  08/27/1930  DATE OF PROCEDURE: DATE OF DISCHARGE:                           CARDIAC CATHETERIZATION   A 77 year old patient with known total occlusion of the distal right coronary artery, recurrent angina.  Diagnostic cath done to rule out progressive coronary artery disease.  Cine catheterization was done from the right femoral artery with 4- French catheters.  Standard JL-4 catheter was used to engage the left main.  Standard Williams catheter was used to engage the right.  Left main coronary artery had a tapering 30% distal stenosis.  Left anterior descending artery was calcified proximally.  There were 30% multiple lesions in the proximal and mid vessel.  The first diagonal branch was a small vessel and normal.  Second diagonal branch was a medium-sized vessel and normal.  The LAD wrapped the apex.  The circumflex coronary artery was nondominant.  There was calcification in the proximal vessel.  There was a very large first obtuse marginal branch which had a 95% ostial stenosis.  This was heavily involved with the calcification.  The AV groove branch supplied collaterals to the PDA, posterior lateral branch.  The distal right coronary artery did not fill.  The right coronary artery was 100% occluded distally which is known since 1999.  RAO ventriculography.  RAO ventriculography was normal.  EF was 65%. There is no gradient across the aortic valve and no MR.  An LV pressure was 157/17.  Aortic pressure is 157/73.  IMPRESSION:  Films will be reviewed with Dr. Excell Seltzer.  The patient would benefit from stenting of the ostium of the large obtuse marginal branch, however, this vessel is heavily calcified and the AV groove branch supplies collaterals to the right  coronary artery.  I would think this will be somewhat of a high-risk procedure.  However, his LAD is not involved and he would not appear to be an ideal candidate for bypass given the limited disease to the ostium of the first obtuse marginal branch.     Noralyn Pick. Eden Emms, MD, [facility]     PCN/MEDQ  D:  12/22/2010  T:  12/23/2010  Job:  [id number]  cc:   Rollene Rotunda, MD, [facility]  Electronically Signed by Charlton Haws MD [facility] on 12/25/2010 12:28:26 PM

## 2010-12-25 NOTE — Discharge Summary (Addendum)
NAMEAXCEL, HORSCH NO.:  [id number]  MEDICAL RECORD NO.:  [id number]  LOCATION:  2504                         FACILITY:  [facility]  PHYSICIAN:  Rollene Rotunda, MD, FACCDATE OF BIRTH:  April 26, 1930  DATE OF ADMISSION:  12/22/2010 DATE OF DISCHARGE:  12/24/2010                              DISCHARGE SUMMARY   PRIMARY CARDIOLOGIST:  Rollene Rotunda, MD, [facility]  DISCHARGE DIAGNOSES: 1. Unstable angina. 2. Coronary artery disease.     a.     Status post percutaneous transluminal coronary angioplasty      with placement of drug-eluting stent in the first obtuse marginal      branch of the circumflex artery, December 23, 2010.     b.     History of occluded right coronary artery by catheterization      in 1980s in [place]. 3. Benign prostatic hypertrophy. 4. Gastroesophageal reflux disease. 5. Glaucoma. 6. Hypertension. 7. Hyperlipidemia. 8. Bilateral carotid stenosis, 20-39%.  Per Dr. Jenene Slicker officenote, the patient will be set up for Dopplers as an outpatient. 9. Gout.  PAST SURGICAL HISTORY:  Eye muscle surgery, TURP, and cyst removal from cheek.  HOSPITAL COURSE:  Mr. Vera is a 77 year old gentleman with a history of known coronary artery disease who presented to Dr. Jenene Slicker office with complaints of chest discomfort, exertional, associated with exercise, substernal without associated symptoms.  He took some old nitroglycerin and thought this may have helped.  It is not happening at rest.  He was referred for cardiac catheterization, given concern for unstable angina, and this demonstrated a large OM stenosis, felt to benefit from PCI.  The patient was brought back for intervention the following day and had PTCA with placement of drug-eluting stent in the first OM branch of the circumflex artery.  LVEF was normal.  The patient tolerated the procedure well.  On day of discharge, he is feeling well. Dr. Antoine Poche has seen and examined him today and feels he  is stable for discharge.  DISCHARGE LABORATORY DATA:  WBCs 7.3, hemoglobin 11.2, hematocrit 33.2, platelet count 162.  Sodium 135, potassium 2.7, chloride 102, CO2 27, glucose 110, BUN 11, creatinine 0.79.  STUDIES:  Cardiac catheterization, October 22 and December 23, 2010, please see full report for details as well as HPI for summary.  DISCHARGE MEDICATIONS: 1. Plavix 75 mg daily. 2. Metoprolol tartrate 25 mg b.i.d. 3. Crestor 20 mg daily. 4. Aspirin 81 mg daily. 5. Allopurinol 300 mg every morning. 6. Betamethasone topical gel 0.05% one application topically t.i.d. 2     weeks. 7. Fosamax 35 mg q.8 hours, but the patient is instructed to talk to     his PCP about alternatives given the risk of stomach bleeding while     taking aspirin and Plavix. 8. Finasteride 5 mg every evening. 9. Fluticasone nasal 50 mcg 2 sprays nasally b.i.d. 10.Levobunolol ophthalmic 0.5% one drop left eye every morning. 11.Lisinopril 40 mg every morning. 12.Nitroglycerin sublingual 0.4 mg every 5 minutes as needed up to 3     doses for chest pain. 13.Tamsulosin 0.4 mg daily as needed for urination difficulties. 14.Travatan ophthalmic 0.004% one drop both eyes daily at bedtime. 15.Zantac  150 mg daily as needed for acid reflux.  DISPOSITION:  Mr. Guild will be discharged in stable condition to home. He is not to lift anything over 5 pounds for 1 week, drive for 2 days, or participate in sexual activity for 1 week.  He should follow a low- sodium, heart-healthy diet and is to call or return for pain, swelling, bleeding, or pus at cath site.  He will see Tereso Newcomer, PA-C and follow up with Dr. Jenene Slicker office on January 05, 2011 at 8:30 a.m.  DURATION OF DISCHARGE ENCOUNTER:  Greater than 30 minutes including physician PA time.     Ronie Spies, P.A.C.   ______________________________ Rollene Rotunda, MD, [facility]    DD/MEDQ  D:  12/24/2010  T:  12/24/2010  Job:  [id number]  cc:   Gaspar Garbe, M.D.  Electronically Signed by Ronie Spies  on 12/25/2010 10:26:59 AM Electronically Signed by Rollene Rotunda MD [facility] on 12/30/2010 05:46:49 PM

## 2011-01-02 ENCOUNTER — Encounter: Payer: Self-pay | Admitting: *Deleted

## 2011-01-05 ENCOUNTER — Encounter: Payer: Self-pay | Admitting: Physician Assistant

## 2011-01-05 ENCOUNTER — Ambulatory Visit (INDEPENDENT_AMBULATORY_CARE_PROVIDER_SITE_OTHER): Payer: Medicare Other | Admitting: Physician Assistant

## 2011-01-05 DIAGNOSIS — E785 Hyperlipidemia, unspecified: Secondary | ICD-10-CM

## 2011-01-05 DIAGNOSIS — I2510 Atherosclerotic heart disease of native coronary artery without angina pectoris: Secondary | ICD-10-CM

## 2011-01-05 DIAGNOSIS — I6529 Occlusion and stenosis of unspecified carotid artery: Secondary | ICD-10-CM

## 2011-01-05 DIAGNOSIS — I10 Essential (primary) hypertension: Secondary | ICD-10-CM

## 2011-01-05 MED ORDER — ROSUVASTATIN CALCIUM 20 MG PO TABS: 20.0000 mg | ORAL_TABLET | Freq: Every day | ORAL | Status: DC

## 2011-01-05 MED ORDER — CLOPIDOGREL BISULFATE 75 MG PO TABS: 75.0000 mg | ORAL_TABLET | Freq: Every day | ORAL | Status: DC

## 2011-01-05 MED ORDER — METOPROLOL SUCCINATE ER 25 MG PO TB24: 25.0000 mg | ORAL_TABLET | Freq: Two times a day (BID) | ORAL | Status: DC

## 2011-01-05 NOTE — Progress Notes (Signed)
History of Present Illness: Primary Cardiologist:  Dr. Rollene Rotunda   [name] is a 77 y.o. male who presents for post hospital follow up.   He has a h/o CAD with known occluded RCA by LHC in the 1980s in [place], HTN, HLP, BPH, gout and carotid stenosis (hx of 0-39% bilat).  He was seen in the office for chest pain and set up for [facility] on 10/22.  LHC 12/22/10: dLM 30%, prox to mid LAD 30%, oOM 95%, AV branches supplied collats to PDA, RCA occluded, EF 65%.  He underwent placement of a Promus DES to the OM.  Hospital labs: Hgb 11.2, K 3.7, creatinine 0.79.    Overall, doing well.  No chest pain.  Notes DOE.  But, walks 30 minutes a day without limitations.  No orthopnea, PND or edema.  No syncope.  No palpitations.  Not sure why his simvastatin was changed to Crestor in the hospital.  Also, he is taking Toprol XL 25 mg BID instead of Lopressor 25 mg BID.  No lightheadedness or dizziness.    Past Medical History  Diagnosis Date  . Arthritis   . Blood transfusion   . GERD (gastroesophageal reflux disease)   . Glaucoma   . Hyperlipidemia   . Hypertension   . BPH (benign prostatic hyperplasia)   . CAD (coronary artery disease)     RCA occluded (Cath [place] 1980s);  LHC 12/22/10: dLM 30%, prox to mid LAD 30%, oOM 95%, AV branches supplied collats to PDA, RCA occluded, EF 65%.  He underwent placement of a Promus DES to the OM.  Marland Kitchen Carotid stenosis     0 - 39% bilateral  . Gout     Current Outpatient Prescriptions  Medication Sig Dispense Refill  . allopurinol (ZYLOPRIM) 300 MG tablet 1 tablet Daily.      Marland Kitchen aspirin EC 81 MG tablet Take 81 mg by mouth daily.        . betamethasone, augmented, (DIPROLENE) 0.05 % gel Apply topically 2 (two) times daily. Apply twice daily 2 weeks per month       . clopidogrel (PLAVIX) 75 MG tablet Take 75 mg by mouth daily.        Marland Kitchen DICLOFENAC SODIUM PO Take 35 mg by mouth every 8 (eight) hours.        . finasteride (PROSCAR) 5 MG tablet 1 tablet Daily.      .  fluticasone (FLONASE) 50 MCG/ACT nasal spray 2 sprays twice daily each side      . levobunolol (BETAGAN) 0.5 % ophthalmic solution 1 drop. Each drop into left eye each morning      . lisinopril (PRINIVIL,ZESTRIL) 40 MG tablet 1 tablet Daily.      . nitroGLYCERIN (NITROSTAT) 0.4 MG SL tablet Place 0.4 mg under the tongue every 5 (five) minutes as needed.        . ranitidine (ZANTAC) 150 MG tablet Take 150 mg by mouth as needed.        . rosuvastatin (CRESTOR) 20 MG tablet Take 20 mg by mouth daily.        . Tamsulosin HCl (FLOMAX) 0.4 MG CAPS 1 tablet Daily.      . TRAVATAN Z 0.004 % ophthalmic solution 1 drop Daily. At bedtime      . metoprolol succinate (TOPROL XL) 25 MG 24 hr tablet Take 1 tablet (25 mg total) by mouth 2 (two) times daily.  30 tablet  11    Allergies: No Known Allergies  History  Substance Use Topics  . Smoking status: Former Smoker -- 2.0 packs/day for 14 years    Types: Cigarettes    Quit date: 12/12/1966  . Smokeless tobacco: Not on file  . Alcohol Use: No     Vital Signs: BP 118/70  Pulse 54  Ht 5' 7.5" (1.715 m)  Wt 170 lb (77.111 kg)  BMI 26.23 kg/m2  PHYSICAL EXAM: Well nourished, well developed, in no acute distress HEENT: normal Neck: no JVD Cardiac:  normal S1, S2; RRR; no murmur Lungs:  clear to auscultation bilaterally, no wheezing, rhonchi or rales Abd: soft, nontender, no hepatomegaly Ext: no edema; RFA and LFA sites without hematoma or bruit Skin: warm and dry Neuro:  CNs 2-12 intact, no focal abnormalities noted Psych: Normal affect  EKG:  Sinus brady, normal axis, HR 54, NSSTTW changes  ASSESSMENT AND PLAN:

## 2011-01-05 NOTE — Assessment & Plan Note (Signed)
Controlled.  Continue Toprol 25 mg bid.

## 2011-01-05 NOTE — Assessment & Plan Note (Signed)
Doing well post PCI.  We discussed the importance of continuing ASA and Plavix.  He will continue his walking as he is doing for activity.  Follow up with Dr. Antoine Poche in 3 mos.

## 2011-01-05 NOTE — Assessment & Plan Note (Signed)
Will make sure follow up carotid dopplers are arranged.

## 2011-01-05 NOTE — Patient Instructions (Addendum)
Your physician recommends that you schedule a follow-up appointment in: 3 MONTHS WITH DR. HOCHREIN PER SCOTT WEAVER, [facility]-  Your physician has requested that you have a carotid duplex DX 433.10 CAROTID ARTERY DISEASE. This test is an ultrasound of the carotid arteries in your neck. It looks at blood flow through these arteries that supply the brain with blood. Allow one hour for this exam. There are no restrictions or special instructions.  PER SCOTT WEAVER, PA-C ADVISES YOU TO NOT PARTICIPATE IN THE DICLOFENAC STUDY.  Your physician recommends that you return for lab work in: PLEASE HAVE DR. TISOVEC OFFICE GET A FASTING LIVER/LIPID PANEL IN 6-8 WEEKS SINCE YOU HAVE JUST BEEN STARTED CRESTOR.  STAY ON THE METOPROLOL SUCCINATE (TOPROL XL) 25 MG 1 TABLET TWICE DAILY PER SCOTT WEAVER, PAC   CRESTOR, PLAVIX, TOPROL XL HAVE ALL BEEN SENT TO [facility] TODAY PER YOUR REQUEST

## 2011-01-05 NOTE — Assessment & Plan Note (Signed)
Prior dose of simvastatin was 80 mg.  I suggested he stay on the Crestor and get his Lipids and LFTs checked with Dr. Wylene Simmer in 6-8 weeks.  He can then compare his numbers and decide which statin is better to remain on going forward.  Goal LDL is < 70.

## 2011-01-30 ENCOUNTER — Encounter (INDEPENDENT_AMBULATORY_CARE_PROVIDER_SITE_OTHER): Payer: Medicare Other | Admitting: *Deleted

## 2011-01-30 DIAGNOSIS — I2510 Atherosclerotic heart disease of native coronary artery without angina pectoris: Secondary | ICD-10-CM

## 2011-01-30 DIAGNOSIS — I6529 Occlusion and stenosis of unspecified carotid artery: Secondary | ICD-10-CM

## 2011-04-09 ENCOUNTER — Encounter: Payer: Self-pay | Admitting: Cardiology

## 2011-04-09 ENCOUNTER — Ambulatory Visit (INDEPENDENT_AMBULATORY_CARE_PROVIDER_SITE_OTHER): Payer: Medicare Other | Admitting: Cardiology

## 2011-04-09 DIAGNOSIS — E785 Hyperlipidemia, unspecified: Secondary | ICD-10-CM

## 2011-04-09 DIAGNOSIS — I6529 Occlusion and stenosis of unspecified carotid artery: Secondary | ICD-10-CM

## 2011-04-09 MED ORDER — TRAMADOL HCL 50 MG PO TABS: 50.0000 mg | ORAL_TABLET | Freq: Three times a day (TID) | ORAL | Status: DC | PRN

## 2011-04-09 NOTE — Assessment & Plan Note (Signed)
The blood pressure is at target. No change in medications is indicated. We will continue with therapeutic lifestyle changes (TLC).  

## 2011-04-09 NOTE — Assessment & Plan Note (Signed)
The patient has no new sypmtoms.  No further cardiovascular testing is indicated.  We will continue with aggressive risk reduction and meds as listed.  I do think it is okay for him to take diclofenac occasionally for joint pain.

## 2011-04-09 NOTE — Progress Notes (Signed)
HPI The patient presents for evaluation of cad.   In the 1980s he had an occluded right coronary artery with catheterization in [place]. He was managed medically.  He was seen in the office for chest pain and set up for [facility] on 10/22. LHC 12/22/10: dLM 30%, prox to mid LAD 30%, oOM 95%, AV branches supplied collats to PDA, RCA occluded, EF 65%. He underwent placement of a Promus DES to the OM.   The patient presents for followup and is doing quite well. The patient denies any new symptoms such as chest discomfort, neck or arm discomfort. There has been no new shortness of breath, PND or orthopnea. There have been no reported palpitations, presyncope or syncope.  He walks about 45 minutes daily. He does have some joint pain. He is not having any of the chest discomfort he had last fall.  No Known Allergies  Current Outpatient Prescriptions  Medication Sig Dispense Refill  . allopurinol (ZYLOPRIM) 300 MG tablet 1 tablet Daily.      Marland Kitchen aspirin EC 81 MG tablet Take 81 mg by mouth daily.        . betamethasone, augmented, (DIPROLENE) 0.05 % gel Apply topically 2 (two) times daily. Apply twice daily 2 weeks per month       . clopidogrel (PLAVIX) 75 MG tablet Take 1 tablet (75 mg total) by mouth daily.  90 tablet  3  . finasteride (PROSCAR) 5 MG tablet 1 tablet Daily.      . fluticasone (FLONASE) 50 MCG/ACT nasal spray 2 sprays twice daily each side      . levobunolol (BETAGAN) 0.5 % ophthalmic solution 1 drop. Each drop into left eye each morning      . lisinopril (PRINIVIL,ZESTRIL) 40 MG tablet 1 tablet Daily.      . metoprolol succinate (TOPROL XL) 25 MG 24 hr tablet Take 1 tablet (25 mg total) by mouth 2 (two) times daily.  180 tablet  3  . nitroGLYCERIN (NITROSTAT) 0.4 MG SL tablet Place 0.4 mg under the tongue every 5 (five) minutes as needed.        . ranitidine (ZANTAC) 150 MG tablet Take 150 mg by mouth as needed.        . simvastatin (ZOCOR) 80 MG tablet Take 80 mg by mouth at bedtime.        . Tamsulosin HCl (FLOMAX) 0.4 MG CAPS 1 tablet Daily.      . TRAVATAN Z 0.004 % ophthalmic solution 1 drop Daily. At bedtime        Past Medical History  Diagnosis Date  . Arthritis   . Blood transfusion   . GERD (gastroesophageal reflux disease)   . Glaucoma   . Hyperlipidemia   . Hypertension   . BPH (benign prostatic hyperplasia)   . CAD (coronary artery disease)     RCA occluded (Cath [place] 1980s);  LHC 12/22/10: dLM 30%, prox to mid LAD 30%, oOM 95%, AV branches supplied collats to PDA, RCA occluded, EF 65%.  He underwent placement of a Promus DES to the OM.  Marland Kitchen Carotid stenosis     0 - 39% bilateral  . Gout     Past Surgical History  Procedure Date  . Colonoscopy   . Polypectomy   . Cyst removed     right cheek  . Eye muscle surgery     right  . Prostate surgery     TURP   ROS:  As stated in the HPI and negative  for all other systems.  PHYSICAL EXAM BP 140/80  Pulse 52  Ht 5\' 8"  (1.727 m)  Wt 169 lb (76.658 kg)  BMI 25.70 kg/m2 GENERAL:  Well appearing HEENT:  Right lens opacification, fundi not visualized, oral mucosa unremarkable NECK:  No jugular venous distention, waveform within normal limits, carotid upstroke brisk and symmetric, no bruits, no thyromegaly LYMPHATICS:  No cervical, inguinal adenopathy LUNGS:  Clear to auscultation bilaterally BACK:  No CVA tenderness CHEST:  Unremarkable HEART:  PMI not displaced or sustained,S1 and S2 within normal limits, no S3, no S4, no clicks, no rubs, no murmurs ABD:  Flat, positive bowel sounds normal in frequency in pitch, no bruits, no rebound, no guarding, no midline pulsatile mass, no hepatomegaly, no splenomegaly EXT:  2 plus pulses throughout, no edema, no cyanosis no clubbing SKIN:  No rashes no nodules NEURO:  Cranial nerves II through XII grossly intact, motor grossly intact throughout PSYCH:  Cognitively intact, oriented to person place and time  ASSESSMENT AND PLAN

## 2011-04-09 NOTE — Assessment & Plan Note (Signed)
He is up to date with follow up of this. 

## 2011-04-09 NOTE — Assessment & Plan Note (Signed)
This is followed by Gaspar Garbe, MD, MD.  He was at target and will have a follow up in April.

## 2011-04-09 NOTE — Patient Instructions (Signed)
The current medical regimen is effective;  continue present plan and medications.  Follow up in 1 year with Dr Hochrein.  You will receive a letter in the mail 2 months before you are due.  Please call us when you receive this letter to schedule your follow up appointment.  

## 2011-04-13 ENCOUNTER — Telehealth: Payer: Self-pay | Admitting: Cardiology

## 2011-04-13 NOTE — Telephone Encounter (Signed)
Spoke with pt who states his nose started dripping blood last night but that it has stopped and he has had no further problems.  Pt instructed to continue to take meds as ordered and call back if further bleeding occurs.

## 2011-04-13 NOTE — Telephone Encounter (Signed)
New problem Pt said he had nosebleed last night and he takes plavix. He wanted to talk to someone. He said he is not bleeding today

## 2011-06-20 ENCOUNTER — Emergency Department (HOSPITAL_COMMUNITY)
Admission: EM | Admit: 2011-06-20 | Discharge: 2011-06-22 | Disposition: A | Discharge status: Home / Self Care | Payer: Medicare Other | Attending: Emergency Medicine | Admitting: Emergency Medicine

## 2011-06-20 ENCOUNTER — Emergency Department (HOSPITAL_COMMUNITY): Payer: Medicare Other

## 2011-06-20 ENCOUNTER — Encounter (HOSPITAL_COMMUNITY): Payer: Self-pay | Admitting: *Deleted

## 2011-06-20 DIAGNOSIS — M75100 Unspecified rotator cuff tear or rupture of unspecified shoulder, not specified as traumatic: Secondary | ICD-10-CM

## 2011-06-20 DIAGNOSIS — I10 Essential (primary) hypertension: Secondary | ICD-10-CM | POA: Insufficient documentation

## 2011-06-20 DIAGNOSIS — E785 Hyperlipidemia, unspecified: Secondary | ICD-10-CM | POA: Insufficient documentation

## 2011-06-20 DIAGNOSIS — K219 Gastro-esophageal reflux disease without esophagitis: Secondary | ICD-10-CM | POA: Insufficient documentation

## 2011-06-20 DIAGNOSIS — I2510 Atherosclerotic heart disease of native coronary artery without angina pectoris: Secondary | ICD-10-CM | POA: Insufficient documentation

## 2011-06-20 DIAGNOSIS — Z8639 Personal history of other endocrine, nutritional and metabolic disease: Secondary | ICD-10-CM | POA: Insufficient documentation

## 2011-06-20 DIAGNOSIS — Z862 Personal history of diseases of the blood and blood-forming organs and certain disorders involving the immune mechanism: Secondary | ICD-10-CM | POA: Insufficient documentation

## 2011-06-20 DIAGNOSIS — Z79899 Other long term (current) drug therapy: Secondary | ICD-10-CM | POA: Insufficient documentation

## 2011-06-20 DIAGNOSIS — Z7982 Long term (current) use of aspirin: Secondary | ICD-10-CM | POA: Insufficient documentation

## 2011-06-20 DIAGNOSIS — M25519 Pain in unspecified shoulder: Secondary | ICD-10-CM | POA: Insufficient documentation

## 2011-06-20 DIAGNOSIS — X58XXXA Exposure to other specified factors, initial encounter: Secondary | ICD-10-CM | POA: Insufficient documentation

## 2011-06-20 DIAGNOSIS — H409 Unspecified glaucoma: Secondary | ICD-10-CM | POA: Insufficient documentation

## 2011-06-20 DIAGNOSIS — S43429A Sprain of unspecified rotator cuff capsule, initial encounter: Secondary | ICD-10-CM | POA: Insufficient documentation

## 2011-06-20 MED ORDER — HYDROCODONE-ACETAMINOPHEN 5-500 MG PO TABS: 1.0000 | ORAL_TABLET | Freq: Four times a day (QID) | ORAL | Status: AC | PRN

## 2011-06-20 MED ORDER — HYDROMORPHONE HCL PF 1 MG/ML IJ SOLN
1.0000 mg | Freq: Once | INTRAMUSCULAR | Status: AC
  Administered 2011-06-20: 1 mg via INTRAVENOUS
  Filled 2011-06-20: qty 1

## 2011-06-20 MED ORDER — SODIUM CHLORIDE 0.9 % IV SOLN
INTRAVENOUS | Status: DC
  Administered 2011-06-20: 17:00:00 via INTRAVENOUS

## 2011-06-20 NOTE — ED Notes (Signed)
To ED for eval of right shoulder deformity since this am. States he was told before that he would have to have shoulder surgery due to problem with cartilage. Right radial pulses wnl

## 2011-06-20 NOTE — ED Notes (Signed)
First meeting with patient. Patient states he fell and hurt his right shoulder several years ago and this morning he woke up with right shoulder pain and deformity in the right shoulder. Patient denies falling or any exercise that would have caused this episode of pain. IV started by EDP and nurse administered pain medication. Family at bedside. Patient waiting for xray.

## 2011-06-20 NOTE — ED Provider Notes (Signed)
History     CSN: [id number]  Arrival date & time 06/20/11  1517   First MD Initiated Contact with Patient 06/20/11 1538      Chief Complaint  Patient presents with  . Shoulder Problem    (Consider location/radiation/quality/duration/timing/severity/associated sxs/prior treatment) Patient is a 78 y.o. male presenting with shoulder pain. The history is provided by the patient. No language interpreter was used.  Shoulder Pain This is a new problem. The current episode started today. The problem occurs constantly. Pertinent negatives include no chest pain, congestion, coughing, fever, joint swelling, nausea, neck pain, vomiting or weakness. The symptoms are aggravated by bending. He has tried nothing for the symptoms.   78 year old male here with complaint of right shoulder pain that is chronic but worsened acutely this morning. States that he was on his way to the park and driving and all of a sudden his shoulder was in severe pain. Patient has had steroid injections in the R shoulder in the past with Dr. Rennis Chris. States the shoulder is anterior and this is not normal. Good radial pulse on the right. Past medical history of hypertension, cardiac stent, arthritis, gout, carotid stenosis, R eye blind since 78 yo.   Shoulder does appear anterior dislocated. Pain is an 8/10.  Dr Wylene Simmer is PCP.  Past Medical History  Diagnosis Date  . Arthritis   . Blood transfusion   . GERD (gastroesophageal reflux disease)   . Glaucoma   . Hyperlipidemia   . Hypertension   . BPH (benign prostatic hyperplasia)   . CAD (coronary artery disease)     RCA occluded (Cath [place] 1980s);  LHC 12/22/10: dLM 30%, prox to mid LAD 30%, oOM 95%, AV branches supplied collats to PDA, RCA occluded, EF 65%.  He underwent placement of a Promus DES to the OM.  Marland Kitchen Carotid stenosis     0 - 39% bilateral  . Gout     Past Surgical History  Procedure Date  . Colonoscopy   . Polypectomy   . Cyst removed     right cheek  .  Eye muscle surgery     right  . Prostate surgery     TURP    Family History  Problem Relation Age of Onset  . Colon cancer Neg Hx   . Prostate cancer Father 21  . Cancer Mother     Stomach    History  Substance Use Topics  . Smoking status: Former Smoker -- 2.0 packs/day for 14 years    Types: Cigarettes    Quit date: 12/12/1966  . Smokeless tobacco: Not on file  . Alcohol Use: No      Review of Systems  Constitutional: Negative.  Negative for fever.  HENT: Negative.  Negative for congestion and neck pain.   Eyes: Negative.   Respiratory: Negative.  Negative for cough.   Cardiovascular: Negative.  Negative for chest pain and palpitations.  Gastrointestinal: Negative.  Negative for nausea and vomiting.  Musculoskeletal: Negative for joint swelling.       R shoulder tenderness and deformity  Skin: Negative.   Neurological: Negative.  Negative for weakness.  Psychiatric/Behavioral: Negative.   All other systems reviewed and are negative.    Allergies  Review of patient's allergies indicates no known allergies.  Home Medications   Current Outpatient Rx  Name Route Sig Dispense Refill  . ALLOPURINOL 300 MG PO TABS Oral Take 300 mg by mouth Daily.     . ASPIRIN EC 81 MG PO TBEC  Oral Take 81 mg by mouth daily.     Marland Kitchen BETAMETHASONE DIPROPIONATE AUG 0.05 % EX GEL Topical Apply 1 application topically See admin instructions. Apply twice daily 2 weeks per month    . CLOPIDOGREL BISULFATE 75 MG PO TABS Oral Take 75 mg by mouth daily.    Marland Kitchen FINASTERIDE 5 MG PO TABS Oral Take 5 mg by mouth Daily.     Marland Kitchen FLUTICASONE PROPIONATE 50 MCG/ACT NA SUSP Nasal Place 2 sprays into the nose 2 (two) times daily. 2 sprays twice daily each side    . LEVOBUNOLOL HCL 0.5 % OP SOLN  1 drop. Each drop into left eye each morning    . LISINOPRIL 40 MG PO TABS Oral Take 1 tablet by mouth Daily.     Marland Kitchen METOPROLOL SUCCINATE ER 25 MG PO TB24 Oral Take 1 tablet (25 mg total) by mouth 2 (two) times daily.  180 tablet 3  . RANITIDINE HCL 150 MG PO TABS Oral Take 150 mg by mouth as needed.      Marland Kitchen SIMVASTATIN 80 MG PO TABS Oral Take 80 mg by mouth at bedtime.    . TAMSULOSIN HCL 0.4 MG PO CAPS Oral Take 0.4 mg by mouth Daily.     . TRAMADOL HCL 50 MG PO TABS Oral Take 50 mg by mouth every 8 (eight) hours as needed. For pain.    . TRAVATAN Z 0.004 % OP SOLN Both Eyes Place 1 drop into both eyes at bedtime.     Marland Kitchen NITROGLYCERIN 0.4 MG SL SUBL Sublingual Place 0.4 mg under the tongue every 5 (five) minutes as needed.        BP 202/85  Pulse 60  Temp(Src) 97.6 F (36.4 C) (Oral)  Resp 22  SpO2 98%  Physical Exam  Nursing note and vitals reviewed. Constitutional: He is oriented to person, place, and time. He appears well-developed and well-nourished.  HENT:  Head: Normocephalic.  Eyes: Conjunctivae and EOM are normal. Pupils are equal, round, and reactive to light.  Neck: Normal range of motion. Neck supple.  Cardiovascular: Normal rate.   Pulmonary/Chest: Effort normal and breath sounds normal. No respiratory distress.  Abdominal: Soft.  Musculoskeletal:       RUE limited ROM with severe pain.  Shoulder is anteriorly located.  2+ R radial pulse with good sensation and movement to the fingers.   Neurological: He is alert and oriented to person, place, and time.  Skin: Skin is warm and dry.  Psychiatric: He has a normal mood and affect.    ED Course  Procedures (including critical care time)  Labs Reviewed - No data to display No results found.   No diagnosis found.    MDM   1700  Will order MRI to further evaluate the R shoulder pain.  Pt will move to CDU to await MRI of the R shoulder. Dilaudid will be given again for his pain.  Report given to [facility].    11:10pm MRI of the right shoulder shows complete supraspinatus and near complete infraspinatus tendon tears with marked retraction and atrophy. Also shows massive volume of fluids and this acromial slope deltoid bursa with  extensive synovial hypertrophy . There is a complete tear of the long head of the biceps. Patient will followup with Dr. supple. Immobilizer provided. Pain medication provided. Patient will be discharged with his wife who is driving.         Remi Haggard, NP 06/21/11 2033

## 2011-06-22 NOTE — ED Provider Notes (Signed)
Medical screening examination/treatment/procedure(s) were conducted as a shared visit with non-physician practitioner(s) and myself.  I personally evaluated the patient during the encounter Pt is a 78 year old man with pain in the right shoulder.  There was no history of recent injury, but he has had orthopedic treatment for this shoulder with steroid injections in the past.  Exam shows him to have severe pain in the anterior aspect of the right shoulder, with pain on ROM.  X-rays were negative, but MRI showed a large rotator cuff tear.   Carleene Cooper III, MD 06/22/11 [phone]

## 2011-06-30 ENCOUNTER — Encounter: Payer: Self-pay | Admitting: Cardiology

## 2011-07-24 ENCOUNTER — Telehealth: Payer: Self-pay | Admitting: Cardiology

## 2011-07-24 NOTE — Telephone Encounter (Signed)
Pt needs clearance for rotator cuff surgery. Dr. Rennis Chris at [facility] will do surgery once he gets clearance office# [phone] fax# [phone]

## 2011-07-24 NOTE — Telephone Encounter (Signed)
Will forward to Dr Hochrein for review and recommendations  

## 2011-07-27 NOTE — Telephone Encounter (Signed)
For an elective surgery I would prefer 12 months on dual antiplatelet therapy prior.  He had his DES in Oct last year.  Call Mr. Sofia with the results and send results to Gaspar Garbe, MD.  Send to Dr. Rennis Chris.  If this is not acceptable to Mr. Bistline set him up for me to see and we can discuss it further.

## 2011-07-28 NOTE — Telephone Encounter (Signed)
Pt aware Dr Antoine Poche would like for him to stay on Plavix until October therefor he will need to wait until then to have surgery.  Pt is having quite a bit of pain in his right shoulder and would like to be able to go ahead and schedule for October.  He will come in to discuss with Dr Antoine Poche.  appt scheduled

## 2011-08-25 ENCOUNTER — Ambulatory Visit: Payer: Medicare Other | Admitting: Cardiology

## 2011-10-14 ENCOUNTER — Ambulatory Visit (INDEPENDENT_AMBULATORY_CARE_PROVIDER_SITE_OTHER): Payer: Medicare Other | Admitting: Cardiology

## 2011-10-14 ENCOUNTER — Encounter: Payer: Self-pay | Admitting: Cardiology

## 2011-10-14 VITALS — BP 162/86 | HR 49 | Ht 68.0 in | Wt 175.8 lb

## 2011-10-14 DIAGNOSIS — E785 Hyperlipidemia, unspecified: Secondary | ICD-10-CM

## 2011-10-14 DIAGNOSIS — I2510 Atherosclerotic heart disease of native coronary artery without angina pectoris: Secondary | ICD-10-CM

## 2011-10-14 DIAGNOSIS — I10 Essential (primary) hypertension: Secondary | ICD-10-CM

## 2011-10-14 DIAGNOSIS — I6529 Occlusion and stenosis of unspecified carotid artery: Secondary | ICD-10-CM

## 2011-10-14 NOTE — Progress Notes (Signed)
HPI The patient presents for evaluation of CAD.   In the 1980s he had an occluded right coronary artery with catheterization in [place]. He was managed medically.  He was seen in the office for chest pain and set up for [facility] on 10/22. LHC 12/22/10: dLM 30%, prox to mid LAD 30%, oOM 95%, AV branches supplied collats to PDA, RCA occluded, EF 65%. He underwent placement of a Promus DES to the OM.   Since last seen the patient has had no new symptoms. He is active walking daily. The patient denies any new symptoms such as chest discomfort, neck or arm discomfort. There has been no new shortness of breath, PND or orthopnea. There have been no reported palpitations, presyncope or syncope.  He is thinking about having a shoulder replacement which would happen after he comes off of Plavix in early November. He has not decided on this yet.  No Known Allergies  Current Outpatient Prescriptions  Medication Sig Dispense Refill  . allopurinol (ZYLOPRIM) 300 MG tablet Take 300 mg by mouth Daily.       Marland Kitchen aspirin EC 81 MG tablet Take 81 mg by mouth daily.       . betamethasone, augmented, (DIPROLENE) 0.05 % gel Apply 1 application topically See admin instructions. Apply twice daily 2 weeks per month      . clopidogrel (PLAVIX) 75 MG tablet Take 75 mg by mouth daily.      . finasteride (PROSCAR) 5 MG tablet Take 5 mg by mouth Daily.       . fluticasone (FLONASE) 50 MCG/ACT nasal spray Place 2 sprays into the nose 2 (two) times daily. 2 sprays twice daily each side      . levobunolol (BETAGAN) 0.5 % ophthalmic solution 1 drop. Each drop into left eye each morning      . lisinopril (PRINIVIL,ZESTRIL) 40 MG tablet Take 1 tablet by mouth Daily.       . metoprolol succinate (TOPROL XL) 25 MG 24 hr tablet Take 1 tablet (25 mg total) by mouth 2 (two) times daily.  180 tablet  3  . nitroGLYCERIN (NITROSTAT) 0.4 MG SL tablet Place 0.4 mg under the tongue every 5 (five) minutes as needed.        . ranitidine (ZANTAC) 150  MG tablet Take 150 mg by mouth as needed.        . simvastatin (ZOCOR) 80 MG tablet Take 80 mg by mouth at bedtime.      . Tamsulosin HCl (FLOMAX) 0.4 MG CAPS Take 0.4 mg by mouth Daily.       . traMADol (ULTRAM) 50 MG tablet Take 50 mg by mouth every 8 (eight) hours as needed. For pain.      . TRAVATAN Z 0.004 % ophthalmic solution Place 1 drop into the left eye at bedtime.         Past Medical History  Diagnosis Date  . Arthritis   . Blood transfusion   . GERD (gastroesophageal reflux disease)   . Glaucoma   . Hyperlipidemia   . Hypertension   . BPH (benign prostatic hyperplasia)   . CAD (coronary artery disease)     RCA occluded (Cath [place] 1980s);  LHC 12/22/10: dLM 30%, prox to mid LAD 30%, oOM 95%, AV branches supplied collats to PDA, RCA occluded, EF 65%.  He underwent placement of a Promus DES to the OM.  Marland Kitchen Carotid stenosis     0 - 39% bilateral  . Gout  Past Surgical History  Procedure Date  . Colonoscopy   . Polypectomy   . Cyst removed     right cheek  . Eye muscle surgery     right  . Prostate surgery     TURP   ROS:  As stated in the HPI and negative for all other systems.  PHYSICAL EXAM BP 162/86  Pulse 49  Ht 5\' 8"  (1.727 m)  Wt 175 lb 12.8 oz (79.742 kg)  BMI 26.73 kg/m2  SpO2 96% GENERAL:  Well appearing HEENT:  Right lens opacification, fundi not visualized, oral mucosa unremarkable NECK:  No jugular venous distention, waveform within normal limits, carotid upstroke brisk and symmetric, no bruits, no thyromegaly LYMPHATICS:  No cervical, inguinal adenopathy LUNGS:  Clear to auscultation bilaterally BACK:  No CVA tenderness CHEST:  Unremarkable HEART:  PMI not displaced or sustained,S1 and S2 within normal limits, no S3, no S4, no clicks, no rubs, no murmurs ABD:  Flat, positive bowel sounds normal in frequency in pitch, no bruits, no rebound, no guarding, no midline pulsatile mass, no hepatomegaly, no splenomegaly EXT:  2 plus pulses  throughout, no edema, no cyanosis no clubbing SKIN:  No rashes no nodules NEURO:  Cranial nerves II through XII grossly intact, motor grossly intact throughout PSYCH:  Cognitively intact, oriented to person place and time   EKG:  Sinus bradycardia, rate 48, voltage criteria for left ventricular hypertrophy, no acute ST-T wave changes.  10/14/2011   ASSESSMENT AND PLAN  CAD (coronary artery disease) -  The patient has no new sypmtoms.  No further cardiovascular testing is indicated.  We will continue with aggressive risk reduction and meds as listed.  Of note he would be at acceptable risk for the surgery it happens in the next 6 months unless he has any new symptoms and we discussed this. He can stop the Plavix in early November. He will continue the other meds as listed.  HTN (hypertension) -  His blood pressure is elevated today but he says it's always well controlled at home and his cough has been checked and verified. I will not make changes.  Carotid stenosis -  He is up to date with follow up of this.  Dyslipidemia -  His LDL in April of 78 with an HDL of 42. He will continue the meds as listed.

## 2011-10-14 NOTE — Patient Instructions (Addendum)
Your physician wants you to follow-up in: one year.   You will receive a reminder letter in the mail two months in advance. If you don't receive a letter, please call our office to schedule the follow-up appointment.  Your physician has recommended you make the following change in your medication:  You can stop your Plavix in early November

## 2011-10-19 ENCOUNTER — Other Ambulatory Visit: Payer: Self-pay | Admitting: Cardiology

## 2011-10-19 MED ORDER — CLOPIDOGREL BISULFATE 75 MG PO TABS: 75.0000 mg | ORAL_TABLET | Freq: Every day | ORAL | Status: DC

## 2011-10-23 ENCOUNTER — Other Ambulatory Visit: Payer: Self-pay

## 2011-10-23 DIAGNOSIS — I6529 Occlusion and stenosis of unspecified carotid artery: Secondary | ICD-10-CM

## 2011-10-23 DIAGNOSIS — I2510 Atherosclerotic heart disease of native coronary artery without angina pectoris: Secondary | ICD-10-CM

## 2011-10-23 MED ORDER — METOPROLOL SUCCINATE ER 25 MG PO TB24: 25.0000 mg | ORAL_TABLET | Freq: Two times a day (BID) | ORAL | Status: DC

## 2011-10-23 NOTE — Telephone Encounter (Signed)
..   Requested Prescriptions   Signed Prescriptions Disp Refills  . metoprolol succinate (TOPROL XL) 25 MG 24 hr tablet 180 tablet 3    Sig: Take 1 tablet (25 mg total) by mouth 2 (two) times daily.    Authorizing Provider: Rollene Rotunda    Ordering User: Christella Hartigan, Gearldine Looney Judie Petit

## 2011-10-30 ENCOUNTER — Inpatient Hospital Stay (HOSPITAL_COMMUNITY)
Admission: EM | Admit: 2011-10-30 | Discharge: 2011-10-31 | DRG: 395 | Disposition: A | Discharge status: Home / Self Care | Payer: Medicare Other | Attending: Internal Medicine | Admitting: Internal Medicine

## 2011-10-30 ENCOUNTER — Telehealth: Payer: Self-pay

## 2011-10-30 ENCOUNTER — Encounter (HOSPITAL_COMMUNITY): Payer: Self-pay | Admitting: Family Medicine

## 2011-10-30 DIAGNOSIS — I2510 Atherosclerotic heart disease of native coronary artery without angina pectoris: Secondary | ICD-10-CM | POA: Diagnosis present

## 2011-10-30 DIAGNOSIS — D696 Thrombocytopenia, unspecified: Secondary | ICD-10-CM | POA: Diagnosis present

## 2011-10-30 DIAGNOSIS — Z9861 Coronary angioplasty status: Secondary | ICD-10-CM

## 2011-10-30 DIAGNOSIS — H409 Unspecified glaucoma: Secondary | ICD-10-CM | POA: Diagnosis present

## 2011-10-30 DIAGNOSIS — K648 Other hemorrhoids: Principal | ICD-10-CM | POA: Diagnosis present

## 2011-10-30 DIAGNOSIS — K219 Gastro-esophageal reflux disease without esophagitis: Secondary | ICD-10-CM | POA: Diagnosis present

## 2011-10-30 DIAGNOSIS — M109 Gout, unspecified: Secondary | ICD-10-CM | POA: Diagnosis present

## 2011-10-30 DIAGNOSIS — R0600 Dyspnea, unspecified: Secondary | ICD-10-CM

## 2011-10-30 DIAGNOSIS — I10 Essential (primary) hypertension: Secondary | ICD-10-CM | POA: Diagnosis present

## 2011-10-30 DIAGNOSIS — D649 Anemia, unspecified: Secondary | ICD-10-CM | POA: Diagnosis present

## 2011-10-30 DIAGNOSIS — I6529 Occlusion and stenosis of unspecified carotid artery: Secondary | ICD-10-CM

## 2011-10-30 DIAGNOSIS — E785 Hyperlipidemia, unspecified: Secondary | ICD-10-CM | POA: Diagnosis present

## 2011-10-30 DIAGNOSIS — R0609 Other forms of dyspnea: Secondary | ICD-10-CM

## 2011-10-30 DIAGNOSIS — K922 Gastrointestinal hemorrhage, unspecified: Secondary | ICD-10-CM | POA: Diagnosis present

## 2011-10-30 DIAGNOSIS — Z7902 Long term (current) use of antithrombotics/antiplatelets: Secondary | ICD-10-CM

## 2011-10-30 DIAGNOSIS — N400 Enlarged prostate without lower urinary tract symptoms: Secondary | ICD-10-CM | POA: Diagnosis present

## 2011-10-30 DIAGNOSIS — Z87891 Personal history of nicotine dependence: Secondary | ICD-10-CM

## 2011-10-30 DIAGNOSIS — D62 Acute posthemorrhagic anemia: Secondary | ICD-10-CM

## 2011-10-30 DIAGNOSIS — K649 Unspecified hemorrhoids: Secondary | ICD-10-CM | POA: Diagnosis present

## 2011-10-30 HISTORY — DX: Pneumonia, unspecified organism: J18.9

## 2011-10-30 HISTORY — DX: Dyspnea, unspecified: R06.00

## 2011-10-30 HISTORY — DX: Diverticulosis of intestine, part unspecified, without perforation or abscess without bleeding: K57.90

## 2011-10-30 HISTORY — DX: Gastrointestinal hemorrhage, unspecified: K92.2

## 2011-10-30 HISTORY — DX: Other forms of dyspnea: R06.09

## 2011-10-30 LAB — CBC WITH DIFFERENTIAL/PLATELET
Basophils Absolute: 0 10*3/uL (ref 0.0–0.1)
Eosinophils Absolute: 0.2 10*3/uL (ref 0.0–0.7)
Lymphs Abs: 1 10*3/uL (ref 0.7–4.0)
MCH: 32.1 pg (ref 26.0–34.0)
Neutrophils Relative %: 68 % (ref 43–77)
Platelets: 142 10*3/uL — ABNORMAL LOW (ref 150–400)
RBC: 4.08 MIL/uL — ABNORMAL LOW (ref 4.22–5.81)
WBC: 5.6 10*3/uL (ref 4.0–10.5)

## 2011-10-30 LAB — PROTIME-INR
INR: 1.08 (ref 0.00–1.49)
Prothrombin Time: 14.2 seconds (ref 11.6–15.2)

## 2011-10-30 LAB — CBC
MCH: 31.6 pg (ref 26.0–34.0)
MCHC: 33.8 g/dL (ref 30.0–36.0)
Platelets: 140 10*3/uL — ABNORMAL LOW (ref 150–400)
Platelets: 151 10*3/uL (ref 150–400)
RBC: 4.24 MIL/uL (ref 4.22–5.81)
RBC: 4.25 MIL/uL (ref 4.22–5.81)
RDW: 13.5 % (ref 11.5–15.5)
RDW: 13.7 % (ref 11.5–15.5)
WBC: 6.7 10*3/uL (ref 4.0–10.5)

## 2011-10-30 LAB — BASIC METABOLIC PANEL
Calcium: 9.2 mg/dL (ref 8.4–10.5)
GFR calc non Af Amer: 78 mL/min — ABNORMAL LOW (ref >90)
Glucose, Bld: 98 mg/dL (ref 70–99)
Sodium: 139 mEq/L (ref 135–145)

## 2011-10-30 LAB — ABO/RH: ABO/RH(D): O NEG

## 2011-10-30 LAB — TYPE AND SCREEN: ABO/RH(D): O NEG

## 2011-10-30 MED ORDER — ACETAMINOPHEN 325 MG PO TABS: 650.0000 mg | ORAL_TABLET | Freq: Four times a day (QID) | ORAL | Status: DC | PRN

## 2011-10-30 MED ORDER — ONDANSETRON HCL 4 MG PO TABS: 4.0000 mg | ORAL_TABLET | Freq: Four times a day (QID) | ORAL | Status: DC | PRN

## 2011-10-30 MED ORDER — HYDROCODONE-ACETAMINOPHEN 5-325 MG PO TABS: 1.0000 | ORAL_TABLET | ORAL | Status: DC | PRN

## 2011-10-30 MED ORDER — FINASTERIDE 5 MG PO TABS
5.0000 mg | ORAL_TABLET | Freq: Every day | ORAL | Status: DC
  Administered 2011-10-31: 5 mg via ORAL
  Filled 2011-10-30: qty 1

## 2011-10-30 MED ORDER — ZOLPIDEM TARTRATE 5 MG PO TABS: 5.0000 mg | ORAL_TABLET | Freq: Every evening | ORAL | Status: DC | PRN

## 2011-10-30 MED ORDER — ACETAMINOPHEN 650 MG RE SUPP: 650.0000 mg | Freq: Four times a day (QID) | RECTAL | Status: DC | PRN

## 2011-10-30 MED ORDER — PANTOPRAZOLE SODIUM 40 MG PO TBEC
40.0000 mg | DELAYED_RELEASE_TABLET | Freq: Two times a day (BID) | ORAL | Status: DC
  Administered 2011-10-31: 40 mg via ORAL
  Filled 2011-10-30: qty 1

## 2011-10-30 MED ORDER — POTASSIUM CHLORIDE IN NACL 20-0.9 MEQ/L-% IV SOLN
INTRAVENOUS | Status: DC
  Administered 2011-10-30 – 2011-10-31 (×2): via INTRAVENOUS
  Filled 2011-10-30 (×3): qty 1000

## 2011-10-30 MED ORDER — ONDANSETRON HCL 4 MG/2ML IJ SOLN: 4.0000 mg | Freq: Four times a day (QID) | INTRAMUSCULAR | Status: DC | PRN

## 2011-10-30 MED ORDER — TRAVOPROST (BAK FREE) 0.004 % OP SOLN
1.0000 [drp] | Freq: Every day | OPHTHALMIC | Status: DC
  Administered 2011-10-30: 1 [drp] via OPHTHALMIC
  Filled 2011-10-30: qty 2.5

## 2011-10-30 MED ORDER — LISINOPRIL 40 MG PO TABS
40.0000 mg | ORAL_TABLET | Freq: Every day | ORAL | Status: DC
  Administered 2011-10-31: 40 mg via ORAL
  Filled 2011-10-30: qty 1

## 2011-10-30 MED ORDER — LEVOBUNOLOL HCL 0.5 % OP SOLN
1.0000 [drp] | Freq: Every day | OPHTHALMIC | Status: DC
Start: 2011-10-31 — End: 2011-10-31
  Filled 2011-10-30 (×2): qty 5

## 2011-10-30 MED ORDER — ATORVASTATIN CALCIUM 40 MG PO TABS
40.0000 mg | ORAL_TABLET | Freq: Every day | ORAL | Status: DC
  Administered 2011-10-30: 40 mg via ORAL
  Filled 2011-10-30 (×2): qty 1

## 2011-10-30 MED ORDER — ALLOPURINOL 300 MG PO TABS
300.0000 mg | ORAL_TABLET | Freq: Every day | ORAL | Status: DC
  Administered 2011-10-31: 300 mg via ORAL
  Filled 2011-10-30: qty 1

## 2011-10-30 MED ORDER — PANTOPRAZOLE SODIUM 40 MG IV SOLR
40.0000 mg | Freq: Once | INTRAVENOUS | Status: AC
  Administered 2011-10-30: 40 mg via INTRAVENOUS
  Filled 2011-10-30: qty 40

## 2011-10-30 MED ORDER — METOPROLOL SUCCINATE ER 25 MG PO TB24
25.0000 mg | ORAL_TABLET | Freq: Two times a day (BID) | ORAL | Status: DC
  Administered 2011-10-31: 25 mg via ORAL
  Filled 2011-10-30 (×3): qty 1

## 2011-10-30 MED ORDER — TAMSULOSIN HCL 0.4 MG PO CAPS
0.4000 mg | ORAL_CAPSULE | Freq: Every day | ORAL | Status: DC
  Administered 2011-10-30: 0.4 mg via ORAL
  Filled 2011-10-30 (×2): qty 1

## 2011-10-30 NOTE — ED Notes (Signed)
Per pt bright red blood from rectum starting last night. sts some clots. Denies any pain.

## 2011-10-30 NOTE — H&P (Addendum)
[facility] Admission H&P   Primary Care Physician:  Gaspar Garbe, MD Primary Gastroenterologist:   Claudette Head, Md CC:  rectal bleeding  HPI: [name] is a 78 y.o. male on Plavix presenting to ED with rectal bleeding. He is known to Dr. Russella Dar for history of diverticulosis. His last colonoscopy August 2012 revealed severe left sided diverticulosis and internal hemorrhoids.   Early this am patient had 3 episodes of painless rectal bleeding (large amount). He has never had this happen. No associated symptoms such as nausea, abdominal pain or rectal pain. He is on Plavix. Takes a baby ASA, no other NSAIDS. No dizziness, SOB or chest pain   Past Medical History  Diagnosis Date  . Arthritis   . Blood transfusion   . GERD (gastroesophageal reflux disease)   . Glaucoma   . Hyperlipidemia   . Hypertension   . BPH (benign prostatic hyperplasia)   . CAD (coronary artery disease)     RCA occluded (Cath [place] 1980s);  LHC 12/22/10: dLM 30%, prox to mid LAD 30%, oOM 95%, AV branches supplied collats to PDA, RCA occluded, EF 65%.  He underwent placement of a Promus DES to the OM.  Marland Kitchen Carotid stenosis     0 - 39% bilateral  . Gout     Past Surgical History  Procedure Date  . Colonoscopy   . Polypectomy   . Cyst removed     right cheek  . Eye muscle surgery     right  . Prostate surgery     TURP    Prior to Admission medications   Medication Sig Start Date End Date Taking? Authorizing Provider  allopurinol (ZYLOPRIM) 300 MG tablet Take 300 mg by mouth Daily.  09/11/10  Yes Historical Provider, MD  aspirin EC 81 MG tablet Take 81 mg by mouth daily.    Yes Historical Provider, MD  betamethasone, augmented, (DIPROLENE) 0.05 % gel Apply 1 application topically 2 (two) times daily as needed. Apply twice daily as needed =per pt   Yes Historical Provider, MD  clopidogrel (PLAVIX) 75 MG tablet Take 1 tablet (75 mg total) by mouth daily. 10/19/11  Yes Rollene Rotunda,  MD  docusate sodium (COLACE) 100 MG capsule Take 100 mg by mouth daily as needed. For constipation   Yes Historical Provider, MD  finasteride (PROSCAR) 5 MG tablet Take 5 mg by mouth Daily.  09/12/10  Yes Historical Provider, MD  fluticasone (FLONASE) 50 MCG/ACT nasal spray Place 2 sprays into the nose 2 (two) times daily. 2 sprays twice daily each side 09/14/10  Yes Historical Provider, MD  levobunolol (BETAGAN) 0.5 % ophthalmic solution 1 drop. Each drop into left eye each morning 07/29/10  Yes Historical Provider, MD  lisinopril (PRINIVIL,ZESTRIL) 40 MG tablet Take 1 tablet by mouth Daily.    Yes Historical Provider, MD  metoprolol succinate (TOPROL XL) 25 MG 24 hr tablet Take 1 tablet (25 mg total) by mouth 2 (two) times daily. 10/23/11 10/22/12 Yes Rollene Rotunda, MD  nitroGLYCERIN (NITROSTAT) 0.4 MG SL tablet Place 0.4 mg under the tongue every 5 (five) minutes as needed.     Yes Historical Provider, MD  ranitidine (ZANTAC) 150 MG tablet Take 150 mg by mouth daily as needed. For heartburn   Yes Historical Provider, MD  simvastatin (ZOCOR) 80 MG tablet Take 80 mg by mouth at bedtime.   Yes Historical Provider, MD  Tamsulosin HCl (FLOMAX) 0.4 MG CAPS Take 0.4 mg by mouth Daily.  Yes Historical Provider, MD  traMADol (ULTRAM) 50 MG tablet Take 50 mg by mouth every 8 (eight) hours as needed. For pain.   Yes Historical Provider, MD  TRAVATAN Z 0.004 % ophthalmic solution Place 1 drop into the left eye at bedtime.  07/29/10  Yes Historical Provider, MD    Current Facility-Administered Medications  Medication Dose Route Frequency Provider Last Rate Last Dose  . pantoprazole (PROTONIX) injection 40 mg  40 mg Intravenous Once Derwood Kaplan, MD   40 mg at 10/30/11 1030   Current Outpatient Prescriptions  Medication Sig Dispense Refill  . allopurinol (ZYLOPRIM) 300 MG tablet Take 300 mg by mouth Daily.       Marland Kitchen aspirin EC 81 MG tablet Take 81 mg by mouth daily.       . betamethasone, augmented,  (DIPROLENE) 0.05 % gel Apply 1 application topically 2 (two) times daily as needed. Apply twice daily as needed =per pt      . clopidogrel (PLAVIX) 75 MG tablet Take 1 tablet (75 mg total) by mouth daily.  90 tablet  0  . docusate sodium (COLACE) 100 MG capsule Take 100 mg by mouth daily as needed. For constipation      . finasteride (PROSCAR) 5 MG tablet Take 5 mg by mouth Daily.       . fluticasone (FLONASE) 50 MCG/ACT nasal spray Place 2 sprays into the nose 2 (two) times daily. 2 sprays twice daily each side      . levobunolol (BETAGAN) 0.5 % ophthalmic solution 1 drop. Each drop into left eye each morning      . lisinopril (PRINIVIL,ZESTRIL) 40 MG tablet Take 1 tablet by mouth Daily.       . metoprolol succinate (TOPROL XL) 25 MG 24 hr tablet Take 1 tablet (25 mg total) by mouth 2 (two) times daily.  180 tablet  3  . nitroGLYCERIN (NITROSTAT) 0.4 MG SL tablet Place 0.4 mg under the tongue every 5 (five) minutes as needed.        . ranitidine (ZANTAC) 150 MG tablet Take 150 mg by mouth daily as needed. For heartburn      . simvastatin (ZOCOR) 80 MG tablet Take 80 mg by mouth at bedtime.      . Tamsulosin HCl (FLOMAX) 0.4 MG CAPS Take 0.4 mg by mouth Daily.       . traMADol (ULTRAM) 50 MG tablet Take 50 mg by mouth every 8 (eight) hours as needed. For pain.      . TRAVATAN Z 0.004 % ophthalmic solution Place 1 drop into the left eye at bedtime.         Allergies as of 10/30/2011  . (No Known Allergies)    Family History  Problem Relation Age of Onset  . Colon cancer Neg Hx   . Prostate cancer Father 62  . Cancer Mother     Stomach    History   Social History  . Marital Status: Married    Spouse Name: N/A    Number of Children: 3  . Years of Education: N/A   Occupational History  . Retired    Social History Main Topics  . Smoking status: Former Smoker -- 2.0 packs/day for 14 years    Types: Cigarettes    Quit date: 12/12/1966  . Smokeless tobacco: Not on file  . Alcohol  Use: No  . Drug Use: No  . Sexually Active: Not on file   Review of Systems: All systems reviewed and  negative except where noted in HPI.   PHYSICAL EXAM: Vital signs in last 24 hours: Temp:  [97.6 F (36.4 C)-97.8 F (36.6 C)] 97.8 F (36.6 C) (08/30 1208) Pulse Rate:  [57-59] 59  (08/30 1208) Resp:  [14-16] 16  (08/30 1208) BP: (198)/(77-93) 198/93 mmHg (08/30 1208) SpO2:  [97 %-98 %] 98 % (08/30 1208)   General:   Pleasant white male in NAD, wife in room Head:  Normocephalic and atraumatic. Eyes:   No icterus.   Conjunctiva pink. Ears:  Normal auditory acuity. Mouth:  Tongue moist. Neck:  Supple; no masses felt Lungs:  Respirations even and unlabored. Lungs clear to auscultation bilaterally.   No wheezes, crackles, or rhonchi.  Heart:  Regular rate and rhythm; no murmurs heard. Abdomen:  Soft, nondistended, nontender. Normal bowel sounds. No appreciable masses or hepatomegaly.  Rectal:  Small amount of stool mixed with blood in vault.  Msk:  Symmetrical without gross deformities.  Extremities:  Without edema. Neurologic:  Alert and  oriented x4;  grossly normal neurologically. Skin:  Intact without significant lesions or rashes. Cervical Nodes:  No significant cervical adenopathy. Psych:  Alert and cooperative. Normal affect.  LAB RESULTS:  Basename 10/30/11 1332 10/30/11 0946  WBC 6.3 5.6  HGB 13.4 13.1  HCT 39.7 38.3*  PLT 140* 142*   BMET  Basename 10/30/11 0946  NA 139  K 4.1  CL 103  CO2 27  GLUCOSE 98  BUN 13  CREATININE 0.91  CALCIUM 9.2  PT/INR  Basename 10/30/11 0946  LABPROT 14.2  INR 1.08     PREVIOUS ENDOSCOPIES: colonoscopy Aug 2012, see HPI  IMPRESSION / PLAN: 90. 78 year old male with severe diverticulosis presenting with painless hematochezia in setting of Plavix. Hemodynamically he is stable. Hemoglobin is actually stable above 13. This could be hemorrhoidal bleeding, especially since hemoglobin hasn't dropped but concerned about  possibility of diverticular bleed in 78 year old patient on Plavix. He needs to be admitted (at least observation). I contacted PCP. We will admit patient, obtain serial hemoglobins. If no bleeding, maybe home tomorrow.  2.CAD, s/p stent placement one year ago. I spoke to PCP. Apparently patient is due to come off Plavix in November. If this turns out to be a major bleed, perhaps he can come off a couple of months earlier. Will monitor for now.  3.Gout, on allopurinol 4. Mild thrombocytopenia, his count runs low normal range in general but slightly lower than baseline at 140.   LOS: 0 days   Willette Cluster  10/30/2011, 3:02 PM  [facility] Attending  I have also seen and assessed the patient and agree with the above note.  His blood pressure is elevated right now - asymptomatic but will need to add other short-term Tx if starting home meds does not alleviate  Iva Boop, MD, [facility] [phone] (pager) 10/30/2011 5:10 PM

## 2011-10-30 NOTE — ED Provider Notes (Signed)
History     CSN: [id number]  Arrival date & time 10/30/11  0856   First MD Initiated Contact with Patient 10/30/11 [phone]      Chief Complaint  Patient presents with  . Rectal Bleeding    (Consider location/radiation/quality/duration/timing/severity/associated sxs/prior treatment) HPI Comments: Pt comes in with of rectal bleed. Pt has no hx of GI bleed, has hx of diverticulosis and internal hemorrhoids per last colonoscopy in late 2012. Pt reports having dark tarry stools mixed with dark blood x 2, with the BM earlier today being large. Pt has no n/v/hematemesis/abd pain. No chest pain, sob, dizziness. Pt dos admit to 15-20 years of heavy drinking, that he stopped 5 years ago. No known hx of liver disease, or any complications of alcohol abuse. Pt is taking asa and plavix daily, no coumadin. Has hx of CAD with stent placement.   Patient is a 78 y.o. male presenting with hematochezia. The history is provided by the patient and medical records.  Rectal Bleeding  Pertinent negatives include no fever, no abdominal pain, no diarrhea, no nausea, no rectal pain, no vomiting, no chest pain, no headaches and no coughing.    Past Medical History  Diagnosis Date  . Arthritis   . Blood transfusion   . GERD (gastroesophageal reflux disease)   . Glaucoma   . Hyperlipidemia   . Hypertension   . BPH (benign prostatic hyperplasia)   . CAD (coronary artery disease)     RCA occluded (Cath [place] 1980s);  LHC 12/22/10: dLM 30%, prox to mid LAD 30%, oOM 95%, AV branches supplied collats to PDA, RCA occluded, EF 65%.  He underwent placement of a Promus DES to the OM.  Marland Kitchen Carotid stenosis     0 - 39% bilateral  . Gout     Past Surgical History  Procedure Date  . Colonoscopy   . Polypectomy   . Cyst removed     right cheek  . Eye muscle surgery     right  . Prostate surgery     TURP    Family History  Problem Relation Age of Onset  . Colon cancer Neg Hx   . Prostate cancer Father 37  .  Cancer Mother     Stomach    History  Substance Use Topics  . Smoking status: Former Smoker -- 2.0 packs/day for 14 years    Types: Cigarettes    Quit date: 12/12/1966  . Smokeless tobacco: Not on file  . Alcohol Use: No      Review of Systems  Constitutional: Negative for fever, chills and activity change.  HENT: Negative for neck pain.   Eyes: Negative for visual disturbance.  Respiratory: Negative for cough, chest tightness and shortness of breath.   Cardiovascular: Negative for chest pain.  Gastrointestinal: Positive for blood in stool and hematochezia. Negative for nausea, vomiting, abdominal pain, diarrhea, abdominal distention, anal bleeding and rectal pain.  Genitourinary: Negative for dysuria, enuresis and difficulty urinating.  Musculoskeletal: Negative for arthralgias.  Neurological: Negative for dizziness, light-headedness and headaches.  Hematological: Does not bruise/bleed easily.  Psychiatric/Behavioral: Negative for confusion.    Allergies  Review of patient's allergies indicates no known allergies.  Home Medications   Current Outpatient Rx  Name Route Sig Dispense Refill  . ALLOPURINOL 300 MG PO TABS Oral Take 300 mg by mouth Daily.     . ASPIRIN EC 81 MG PO TBEC Oral Take 81 mg by mouth daily.     Marland Kitchen BETAMETHASONE DIPROPIONATE AUG  0.05 % EX GEL Topical Apply 1 application topically 2 (two) times daily as needed. Apply twice daily as needed =per pt    . CLOPIDOGREL BISULFATE 75 MG PO TABS Oral Take 1 tablet (75 mg total) by mouth daily. 90 tablet 0    STOP PLAVIX IN EARLY NOVEMBER  . DOCUSATE SODIUM 100 MG PO CAPS Oral Take 100 mg by mouth daily as needed. For constipation    . FINASTERIDE 5 MG PO TABS Oral Take 5 mg by mouth Daily.     Marland Kitchen FLUTICASONE PROPIONATE 50 MCG/ACT NA SUSP Nasal Place 2 sprays into the nose 2 (two) times daily. 2 sprays twice daily each side    . LEVOBUNOLOL HCL 0.5 % OP SOLN  1 drop. Each drop into left eye each morning    .  LISINOPRIL 40 MG PO TABS Oral Take 1 tablet by mouth Daily.     Marland Kitchen METOPROLOL SUCCINATE ER 25 MG PO TB24 Oral Take 1 tablet (25 mg total) by mouth 2 (two) times daily. 180 tablet 3  . NITROGLYCERIN 0.4 MG SL SUBL Sublingual Place 0.4 mg under the tongue every 5 (five) minutes as needed.      Marland Kitchen RANITIDINE HCL 150 MG PO TABS Oral Take 150 mg by mouth daily as needed. For heartburn    . SIMVASTATIN 80 MG PO TABS Oral Take 80 mg by mouth at bedtime.    . TAMSULOSIN HCL 0.4 MG PO CAPS Oral Take 0.4 mg by mouth Daily.     . TRAMADOL HCL 50 MG PO TABS Oral Take 50 mg by mouth every 8 (eight) hours as needed. For pain.    . TRAVATAN Z 0.004 % OP SOLN Left Eye Place 1 drop into the left eye at bedtime.       BP 198/77  Pulse 57  Temp 97.6 F (36.4 C)  Resp 14  SpO2 97%  Physical Exam  Nursing note and vitals reviewed. Constitutional: He is oriented to person, place, and time. He appears well-developed.  HENT:  Head: Normocephalic and atraumatic.  Eyes: Conjunctivae and EOM are normal. Pupils are equal, round, and reactive to light.  Neck: Normal range of motion. Neck supple.  Cardiovascular: Normal rate and regular rhythm.   Pulmonary/Chest: Effort normal and breath sounds normal.  Abdominal: Soft. Bowel sounds are normal. He exhibits no distension. There is no tenderness. There is no rebound and no guarding.       DRE - no anal fissures, pt has grossly bloody stools.   Neurological: He is alert and oriented to person, place, and time.  Skin: Skin is warm.    ED Course  Procedures (including critical care time)   Labs Reviewed  BASIC METABOLIC PANEL  CBC WITH DIFFERENTIAL  PROTIME-INR  APTT  CBC  TYPE AND SCREEN   No results found.   No diagnosis found.    MDM  DDx includes: Esophagitis Mallory Weiss tear Variceal bleeding PUD/Gastritis/ulcers Diverticular bleed Colon cancer Rectal bleed Internal hemorrhoids External hemorrhoids  A/P: Pt with known diverticuar  and internal hemorrhoids hx comes in with cc of painless bleed - BRBPR. Hemodynamically stable. Exam is unremarkable besides the bloody stools. We will get the basic labs. Given pt is on plavix, and has CAD with stents, if labs are WNL here, patient would be a good candidate for admission for serial cbc and possibly GI intervention if the bleeding worsens.         Derwood Kaplan, MD 10/30/11 1001

## 2011-10-30 NOTE — Consult Note (Signed)
[facility] Admission H&P   Primary Care Physician:  Gaspar Garbe, MD Primary Gastroenterologist:   Claudette Head, Md CC:  rectal bleeding  HPI: [name] is a 78 y.o. male on Plavix presenting to ED with rectal bleeding. He is known to Dr. Russella Dar for history of diverticulosis. His last colonoscopy August 2012 revealed severe left sided diverticulosis and internal hemorrhoids.   Early this am patient had 3 episodes of painless rectal bleeding (large amount). He has never had this happen. No associated symptoms such as nausea, abdominal pain or rectal pain. He is on Plavix. Takes a baby ASA, no other NSAIDS. No dizziness, SOB or chest pain   Past Medical History  Diagnosis Date  . Arthritis   . Blood transfusion   . GERD (gastroesophageal reflux disease)   . Glaucoma   . Hyperlipidemia   . Hypertension   . BPH (benign prostatic hyperplasia)   . CAD (coronary artery disease)     RCA occluded (Cath [place] 1980s);  LHC 12/22/10: dLM 30%, prox to mid LAD 30%, oOM 95%, AV branches supplied collats to PDA, RCA occluded, EF 65%.  He underwent placement of a Promus DES to the OM.  Marland Kitchen Carotid stenosis     0 - 39% bilateral  . Gout     Past Surgical History  Procedure Date  . Colonoscopy   . Polypectomy   . Cyst removed     right cheek  . Eye muscle surgery     right  . Prostate surgery     TURP    Prior to Admission medications   Medication Sig Start Date End Date Taking? Authorizing Provider  allopurinol (ZYLOPRIM) 300 MG tablet Take 300 mg by mouth Daily.  09/11/10  Yes Historical Provider, MD  aspirin EC 81 MG tablet Take 81 mg by mouth daily.    Yes Historical Provider, MD  betamethasone, augmented, (DIPROLENE) 0.05 % gel Apply 1 application topically 2 (two) times daily as needed. Apply twice daily as needed =per pt   Yes Historical Provider, MD  clopidogrel (PLAVIX) 75 MG tablet Take 1 tablet (75 mg total) by mouth daily. 10/19/11  Yes Rollene Rotunda,  MD  docusate sodium (COLACE) 100 MG capsule Take 100 mg by mouth daily as needed. For constipation   Yes Historical Provider, MD  finasteride (PROSCAR) 5 MG tablet Take 5 mg by mouth Daily.  09/12/10  Yes Historical Provider, MD  fluticasone (FLONASE) 50 MCG/ACT nasal spray Place 2 sprays into the nose 2 (two) times daily. 2 sprays twice daily each side 09/14/10  Yes Historical Provider, MD  levobunolol (BETAGAN) 0.5 % ophthalmic solution 1 drop. Each drop into left eye each morning 07/29/10  Yes Historical Provider, MD  lisinopril (PRINIVIL,ZESTRIL) 40 MG tablet Take 1 tablet by mouth Daily.    Yes Historical Provider, MD  metoprolol succinate (TOPROL XL) 25 MG 24 hr tablet Take 1 tablet (25 mg total) by mouth 2 (two) times daily. 10/23/11 10/22/12 Yes Rollene Rotunda, MD  nitroGLYCERIN (NITROSTAT) 0.4 MG SL tablet Place 0.4 mg under the tongue every 5 (five) minutes as needed.     Yes Historical Provider, MD  ranitidine (ZANTAC) 150 MG tablet Take 150 mg by mouth daily as needed. For heartburn   Yes Historical Provider, MD  simvastatin (ZOCOR) 80 MG tablet Take 80 mg by mouth at bedtime.   Yes Historical Provider, MD  Tamsulosin HCl (FLOMAX) 0.4 MG CAPS Take 0.4 mg by mouth Daily.  Yes Historical Provider, MD  traMADol (ULTRAM) 50 MG tablet Take 50 mg by mouth every 8 (eight) hours as needed. For pain.   Yes Historical Provider, MD  TRAVATAN Z 0.004 % ophthalmic solution Place 1 drop into the left eye at bedtime.  07/29/10  Yes Historical Provider, MD    Current Facility-Administered Medications  Medication Dose Route Frequency Provider Last Rate Last Dose  . pantoprazole (PROTONIX) injection 40 mg  40 mg Intravenous Once Derwood Kaplan, MD   40 mg at 10/30/11 1030   Current Outpatient Prescriptions  Medication Sig Dispense Refill  . allopurinol (ZYLOPRIM) 300 MG tablet Take 300 mg by mouth Daily.       Marland Kitchen aspirin EC 81 MG tablet Take 81 mg by mouth daily.       . betamethasone, augmented,  (DIPROLENE) 0.05 % gel Apply 1 application topically 2 (two) times daily as needed. Apply twice daily as needed =per pt      . clopidogrel (PLAVIX) 75 MG tablet Take 1 tablet (75 mg total) by mouth daily.  90 tablet  0  . docusate sodium (COLACE) 100 MG capsule Take 100 mg by mouth daily as needed. For constipation      . finasteride (PROSCAR) 5 MG tablet Take 5 mg by mouth Daily.       . fluticasone (FLONASE) 50 MCG/ACT nasal spray Place 2 sprays into the nose 2 (two) times daily. 2 sprays twice daily each side      . levobunolol (BETAGAN) 0.5 % ophthalmic solution 1 drop. Each drop into left eye each morning      . lisinopril (PRINIVIL,ZESTRIL) 40 MG tablet Take 1 tablet by mouth Daily.       . metoprolol succinate (TOPROL XL) 25 MG 24 hr tablet Take 1 tablet (25 mg total) by mouth 2 (two) times daily.  180 tablet  3  . nitroGLYCERIN (NITROSTAT) 0.4 MG SL tablet Place 0.4 mg under the tongue every 5 (five) minutes as needed.        . ranitidine (ZANTAC) 150 MG tablet Take 150 mg by mouth daily as needed. For heartburn      . simvastatin (ZOCOR) 80 MG tablet Take 80 mg by mouth at bedtime.      . Tamsulosin HCl (FLOMAX) 0.4 MG CAPS Take 0.4 mg by mouth Daily.       . traMADol (ULTRAM) 50 MG tablet Take 50 mg by mouth every 8 (eight) hours as needed. For pain.      . TRAVATAN Z 0.004 % ophthalmic solution Place 1 drop into the left eye at bedtime.         Allergies as of 10/30/2011  . (No Known Allergies)    Family History  Problem Relation Age of Onset  . Colon cancer Neg Hx   . Prostate cancer Father 72  . Cancer Mother     Stomach    History   Social History  . Marital Status: Married    Spouse Name: N/A    Number of Children: 3  . Years of Education: N/A   Occupational History  . Retired    Social History Main Topics  . Smoking status: Former Smoker -- 2.0 packs/day for 14 years    Types: Cigarettes    Quit date: 12/12/1966  . Smokeless tobacco: Not on file  . Alcohol  Use: No  . Drug Use: No  . Sexually Active: Not on file   Review of Systems: All systems reviewed and  negative except where noted in HPI.   PHYSICAL EXAM: Vital signs in last 24 hours: Temp:  [97.6 F (36.4 C)-97.8 F (36.6 C)] 97.8 F (36.6 C) (08/30 1208) Pulse Rate:  [57-59] 59  (08/30 1208) Resp:  [14-16] 16  (08/30 1208) BP: (198)/(77-93) 198/93 mmHg (08/30 1208) SpO2:  [97 %-98 %] 98 % (08/30 1208)   General:   Pleasant white male in NAD, wife in room Head:  Normocephalic and atraumatic. Eyes:   No icterus.   Conjunctiva pink. Ears:  Normal auditory acuity. Mouth:  Tongue moist. Neck:  Supple; no masses felt Lungs:  Respirations even and unlabored. Lungs clear to auscultation bilaterally.   No wheezes, crackles, or rhonchi.  Heart:  Regular rate and rhythm; no murmurs heard. Abdomen:  Soft, nondistended, nontender. Normal bowel sounds. No appreciable masses or hepatomegaly.  Rectal:  Small amount of stool mixed with blood in vault.  Msk:  Symmetrical without gross deformities.  Extremities:  Without edema. Neurologic:  Alert and  oriented x4;  grossly normal neurologically. Skin:  Intact without significant lesions or rashes. Cervical Nodes:  No significant cervical adenopathy. Psych:  Alert and cooperative. Normal affect.  LAB RESULTS:  Basename 10/30/11 1332 10/30/11 0946  WBC 6.3 5.6  HGB 13.4 13.1  HCT 39.7 38.3*  PLT 140* 142*   BMET  Basename 10/30/11 0946  NA 139  K 4.1  CL 103  CO2 27  GLUCOSE 98  BUN 13  CREATININE 0.91  CALCIUM 9.2  PT/INR  Basename 10/30/11 0946  LABPROT 14.2  INR 1.08     PREVIOUS ENDOSCOPIES: colonoscopy Aug 2012, see HPI  IMPRESSION / PLAN: 70. 78 year old male with severe diverticulosis presenting with painless hematochezia in setting of Plavix. Hemodynamically he is stable. Hemoglobin is actually stable above 13. This could be hemorrhoidal bleeding, especially since hemoglobin hasn't dropped but concerned about  possibility of diverticular bleed in 78 year old patient on Plavix. He needs to be admitted (at least observation). I contacted PCP. We will admit patient, obtain serial hemoglobins. If no bleeding, maybe home tomorrow.  2.CAD, s/p stent placement one year ago. I spoke to PCP. Apparently patient is due to come off Plavix in November. If this turns out to be a major bleed, perhaps he can come off a couple of months earlier. Will monitor for now.  3.Gout, on allopurinol 4. Mild thrombocytopenia, his count runs low normal range in general but slightly lower than baseline at 140.   LOS: 0 days   Willette Cluster  10/30/2011, 3:02 PM

## 2011-10-30 NOTE — ED Notes (Signed)
Family at bedside. Wife Isaiha Asare: [phone]

## 2011-10-30 NOTE — Consult Note (Signed)
PCP:   Gaspar Garbe, MD   Chief Complaint:  Lower GI bleed  HPI: Patient is a 78 year old male with history of CAD and PCI/Stent in the past year.  Came to ER with 3 bloody BM's this morning.  Has contacted GI who told him to go by EMS>  His Hgb is 13 in the ER.  GI is requesting medical admission for serial Hgb checks and will decide on scoping him in the AM.  He has a history of colonoscopy last year with severe L sided diverticula.  He feels a bit weak, but is otherwise in his usual state of health.   Family History (reviewed - no changes required): Father d 108 Prostate Cancer mother d 44 Stomach cancer 1 brother, 1 sister Social History (reviewed - no changes required): Married with 3 children Retired Horticulturist, commercial Past Medical History: Past Medical History  Diagnosis Date  . Arthritis   . Blood transfusion   . GERD (gastroesophageal reflux disease)   . Glaucoma   . Hyperlipidemia   . Hypertension   . BPH (benign prostatic hyperplasia)   . CAD (coronary artery disease)     RCA occluded (Cath [place] 1980s);  LHC 12/22/10: dLM 30%, prox to mid LAD 30%, oOM 95%, AV branches supplied collats to PDA, RCA occluded, EF 65%.  He underwent placement of a Promus DES to the OM.  Marland Kitchen Carotid stenosis     0 - 39% bilateral  . Gout    Past Surgical History  Procedure Date  . Colonoscopy   . Polypectomy   . Cyst removed     right cheek  . Eye muscle surgery     right  . Prostate surgery     TURP    Medications: Prior to Admission medications   Medication Sig Start Date End Date Taking? Authorizing Provider  allopurinol (ZYLOPRIM) 300 MG tablet Take 300 mg by mouth Daily.  09/11/10  Yes Historical Provider, MD  aspirin EC 81 MG tablet Take 81 mg by mouth daily.    Yes Historical Provider, MD  betamethasone, augmented, (DIPROLENE) 0.05 % gel Apply 1 application topically 2 (two) times daily as needed. Apply twice daily as needed =per pt   Yes Historical Provider, MD    clopidogrel (PLAVIX) 75 MG tablet Take 1 tablet (75 mg total) by mouth daily. 10/19/11  Yes Rollene Rotunda, MD  docusate sodium (COLACE) 100 MG capsule Take 100 mg by mouth daily as needed. For constipation   Yes Historical Provider, MD  finasteride (PROSCAR) 5 MG tablet Take 5 mg by mouth Daily.  09/12/10  Yes Historical Provider, MD  fluticasone (FLONASE) 50 MCG/ACT nasal spray Place 2 sprays into the nose 2 (two) times daily. 2 sprays twice daily each side 09/14/10  Yes Historical Provider, MD  levobunolol (BETAGAN) 0.5 % ophthalmic solution 1 drop. Each drop into left eye each morning 07/29/10  Yes Historical Provider, MD  lisinopril (PRINIVIL,ZESTRIL) 40 MG tablet Take 1 tablet by mouth Daily.    Yes Historical Provider, MD  metoprolol succinate (TOPROL XL) 25 MG 24 hr tablet Take 1 tablet (25 mg total) by mouth 2 (two) times daily. 10/23/11 10/22/12 Yes Rollene Rotunda, MD  nitroGLYCERIN (NITROSTAT) 0.4 MG SL tablet Place 0.4 mg under the tongue every 5 (five) minutes as needed.     Yes Historical Provider, MD  ranitidine (ZANTAC) 150 MG tablet Take 150 mg by mouth daily as needed. For heartburn   Yes Historical Provider, MD  simvastatin (ZOCOR) 80 MG tablet Take 80 mg by mouth at bedtime.   Yes Historical Provider, MD  Tamsulosin HCl (FLOMAX) 0.4 MG CAPS Take 0.4 mg by mouth Daily.    Yes Historical Provider, MD  traMADol (ULTRAM) 50 MG tablet Take 50 mg by mouth every 8 (eight) hours as needed. For pain.   Yes Historical Provider, MD  TRAVATAN Z 0.004 % ophthalmic solution Place 1 drop into the left eye at bedtime.  07/29/10  Yes Historical Provider, MD    Allergies:  No Known Allergies  Social History:  reports that he quit smoking about 44 years ago. His smoking use included Cigarettes. He has a 28 pack-year smoking history. He does not have any smokeless tobacco history on file. He reports that he does not drink alcohol or use illicit drugs.  Family History: Family History  Problem  Relation Age of Onset  . Colon cancer Neg Hx   . Prostate cancer Father 34  . Cancer Mother     Stomach    Physical Exam: Filed Vitals:   10/30/11 0902 10/30/11 1208  BP: 198/77 198/93  Pulse: 57 59  Temp: 97.6 F (36.4 C) 97.8 F (36.6 C)  TempSrc:  Oral  Resp: 14 16  SpO2: 97% 98%   Labs on Admission:   [facility] 10/30/11 0946  NA 139  K 4.1  CL 103  CO2 27  GLUCOSE 98  BUN 13  CREATININE 0.91  CALCIUM 9.2  MG --  PHOS --    Basename 10/30/11 1332 10/30/11 0946  WBC 6.3 5.6  NEUTROABS -- 3.8  HGB 13.4 13.1  HCT 39.7 38.3*  MCV 93.6 93.9  PLT 140* 142*   Radiological Exams on Admission: No results found. Orders placed in visit on 10/14/11  . EKG 12-LEAD    Assessment/Plan 1) Lower GI bleed, likely diverticular.  WIll check serial Hgb, GI will eval to decide on intervention vs watch and wait. GI has agreed to have him on their service after speaking with th PA from  GI.  I have completed his admitting orders.  We will be available for consult if needed. Type and screen done in ER.  Does not require transfusion at this time.  Plavix and ASA held. 2) WIll replace his Zantac with PPI BID given active bleeding 3) Glaucoma- Continue drops 4) BPH- Dual therapy 5) CAD s/p stent.  BP and lipid control, no antiplatelets at this time given active bleeding.  Was supposed to come off Plavix in November due to time and considering shoulder surgery.  Might just choose to D/c altogether at this time.   Brantleigh Mifflin W 10/30/2011, 3:21 PM

## 2011-10-30 NOTE — Telephone Encounter (Signed)
Agree 

## 2011-10-30 NOTE — Telephone Encounter (Signed)
Patient called this am to report that he is having a large amount of rectal bleeding.  He reports the bleeding started last night and is passing blood independent of stool.  He reports with each episode it is a large amount of blood.  He is on Plavix for a stent that was placed last fall.  He is advised to go to the ER at [facility] for evaluation and that he should not drive himself.  He verbalizes understanding.  Willette Cluster RNP advised that the patient is on his way.

## 2011-10-30 NOTE — ED Notes (Signed)
Pt reports bleeding from rectum bright red since last pm, total of 3 bowel movements since last pm, denies pain

## 2011-10-31 DIAGNOSIS — K648 Other hemorrhoids: Principal | ICD-10-CM

## 2011-10-31 LAB — CBC
HCT: 35.2 % — ABNORMAL LOW (ref 39.0–52.0)
Hemoglobin: 11.9 g/dL — ABNORMAL LOW (ref 13.0–17.0)
RBC: 3.76 MIL/uL — ABNORMAL LOW (ref 4.22–5.81)
WBC: 6.5 10*3/uL (ref 4.0–10.5)

## 2011-10-31 LAB — COMPREHENSIVE METABOLIC PANEL
ALT: 9 U/L (ref 0–53)
Alkaline Phosphatase: 68 U/L (ref 39–117)
BUN: 16 mg/dL (ref 6–23)
CO2: 25 mEq/L (ref 19–32)
Chloride: 106 mEq/L (ref 96–112)
GFR calc Af Amer: >90 mL/min (ref >90)
GFR calc non Af Amer: 78 mL/min — ABNORMAL LOW (ref >90)
Glucose, Bld: 98 mg/dL (ref 70–99)
Potassium: 4 mEq/L (ref 3.5–5.1)
Sodium: 138 mEq/L (ref 135–145)
Total Bilirubin: 0.3 mg/dL (ref 0.3–1.2)
Total Protein: 5.5 g/dL — ABNORMAL LOW (ref 6.0–8.3)

## 2011-10-31 MED ORDER — HYDROCORTISONE ACETATE 25 MG RE SUPP: 25.0000 mg | Freq: Every day | RECTAL | Status: AC

## 2011-10-31 MED ORDER — CLOPIDOGREL BISULFATE 75 MG PO TABS: 75.0000 mg | ORAL_TABLET | Freq: Every day | ORAL | Status: DC

## 2011-10-31 NOTE — Progress Notes (Signed)
     [facility] Daily Rounding Note 10/31/2011, 11:22 AM  SUBJECTIVE:  Slight bleeding overnight but no pain BP better  OBJECTIVE: General: NAD  Vital signs in last 24 hours: Temp:  [97.4 F (36.3 C)-98.7 F (37.1 C)] 97.4 F (36.3 C) (08/31 0559) Pulse Rate:  [58-78] 78  (08/31 0559) Resp:  [15-16] 16  (08/31 0559) BP: (114-209)/(53-93) 146/56 mmHg (08/31 0559) SpO2:  [95 %-98 %] 97 % (08/31 0559) Weight:  [172 lb (78.019 kg)] 172 lb (78.019 kg) (08/30 1700) Last BM Date: 10/30/11  Heart: IRRs1s2 Chest: clear Abdomen: soft and nontender BS ++++  Neuro/Psych:  Appropriate  Rectal: brown-red stool Anoscopy - small hemorrhoids without clear bleeding stigmata, red blood on tip of trochar  Intake/Output from previous day: 08/30 0701 - 08/31 0700 In: 331.3 [I.V.:331.3] Out: -   Intake/Output this shift:    Lab Results:  Basename 10/31/11 0500 10/30/11 1936 10/30/11 1332  WBC 6.5 6.7 6.3  HGB 11.9* 13.2 13.4  HCT 35.2* 40.1 39.7  PLT 132* 151 140*   BMET  Basename 10/31/11 0500 10/30/11 0946  NA 138 139  K 4.0 4.1  CL 106 103  CO2 25 27  GLUCOSE 98 98  BUN 16 13  CREATININE 0.92 0.91  CALCIUM 8.6 9.2   LFT  Basename 10/31/11 0500  PROT 5.5*  ALBUMIN 3.1*  AST 16  ALT 9  ALKPHOS 68  BILITOT 0.3  BILIDIR --  IBILI --     ASSESMENT: 1) Lower GI bleed, low volume but I think this is diverticular (not hemorrhoids) 2) Anemia - mild now - acute blood loss vs. Dilutional or both 3)  Hypertension - better 4) CAD - s/p stent - plavix held day 1   PLAN: 1) serial Hgb 2) enema - ? How much blood 3) possbile flex sig 4) continue to hold Plavix   LOS: 1 day   Stan Head  10/31/2011, 11:22 AM  Pager [phone]

## 2011-10-31 NOTE — Discharge Summary (Signed)
[facility] Discharge Summary  Name: [name] MRN: [id number] DOB: Dec 16, 1930 78 y.o. PCP:  Gaspar Garbe, MD  Date of Admission: 10/30/2011  9:04 AM Date of Discharge: 10/31/2011 Attending Physician:Ennis Heavner Leone Payor, MD  Admitting Dignosis: Active Problems:  HTN (hypertension)  Lower GI bleed  Hemorrhoids, internal, with bleeding  Past Medical History  Diagnosis Date  . GERD (gastroesophageal reflux disease)   . Glaucoma   . Hyperlipidemia   . Hypertension   . BPH (benign prostatic hyperplasia)   . CAD (coronary artery disease)     RCA occluded (Cath [place] 1980s);  LHC 12/22/10: dLM 30%, prox to mid LAD 30%, oOM 95%, AV branches supplied collats to PDA, RCA occluded, EF 65%.  He underwent placement of a Promus DES to the OM.  Marland Kitchen Carotid stenosis     0 - 39% bilateral  . Gout   . Diverticulosis   . Anginal pain 12/2010  . Pneumonia 1950's  . Exertional dyspnea 10/30/2011  . Acute GI bleeding 10/30/2011  . 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 Colonoscopy   . Cystectomy     right cheek  . Transurethral resection of prostate ~ 2007  . Eye muscle surgery 1953    right  . Coronary angioplasty with stent placement 12/2010    "1"     Discharge Diagnosis: Active Problems:  HTN (hypertension)  Lower GI bleed  Hemorrhoids, internal, with bleeding   Consultations: Treatment Team:  Iva Boop, MD  Procedures Performed:  Anoscopy - internal hemorrhoids   Brief History: The patient presented with 3 episodes of hematochezia prior to admission. Red blood and some stool on rectal exam. He was admitteded to observe for more serious bleeding as he had hx of severe left-side diverticulosis ohn recent colonoscopy. He had no significant change in Hgb 13 except one result of 11. No significant bleeding and tap water enema produced brown stoo,l. Anoscopy had shown internal hemorrhoids, slight red  blood and brownish stool.  It was concluded that he had hemorrhoidal bleeding.   BP was elevated as high as 200 systolic. It responded to restarting metoprolol.It was mildly elevated after that. Hospital Course by problem list: Active Problems:  HTN (hypertension)  Lower GI bleed  Hemorrhoids, internal, with bleeding     Discharge Vitals:  BP 168/59  Pulse 56  Temp 97.7 F (36.5 C) (Oral)  Resp 20  Ht 5\' 8"  (1.727 m)  Wt 172 lb (78.019 kg)  BMI 26.15 kg/m2  SpO2 97%  Discharge Labs:  Results for orders placed during the hospital encounter of 10/30/11 (from the past 24 hour(s))  CBC     Status: Normal   Collection Time   10/30/11  7:36 PM      Component Value Range   WBC 6.7  4.0 - 10.5 K/uL   RBC 4.25  4.22 - 5.81 MIL/uL   Hemoglobin 13.2  13.0 - 17.0 g/dL   HCT 40.9  81.1 - 91.4 %   MCV 94.4  78.0 - 100.0 fL   MCH 31.1  26.0 - 34.0 pg   MCHC 32.9  30.0 - 36.0 g/dL   RDW 78.2  95.6 - 21.3 %   Platelets 151  150 - 400 K/uL  CBC     Status: Abnormal   Collection Time   10/31/11  5:00 AM      Component Value Range   WBC 6.5  4.0 - 10.5 K/uL   RBC 3.76 (*) 4.22 - 5.81 MIL/uL   Hemoglobin 11.9 (*) 13.0 - 17.0 g/dL   HCT 30.8 (*) 65.7 - 84.6 %   MCV 93.6  78.0 - 100.0 fL   MCH 31.6  26.0 - 34.0 pg   MCHC 33.8  30.0 - 36.0 g/dL   RDW 96.2  95.2 - 84.1 %   Platelets 132 (*) 150 - 400 K/uL  COMPREHENSIVE METABOLIC PANEL     Status: Abnormal   Collection Time   10/31/11  5:00 AM      Component Value Range   Sodium 138  135 - 145 mEq/L   Potassium 4.0  3.5 - 5.1 mEq/L   Chloride 106  96 - 112 mEq/L   CO2 25  19 - 32 mEq/L   Glucose, Bld 98  70 - 99 mg/dL   BUN 16  6 - 23 mg/dL   Creatinine, Ser 3.24  0.50 - 1.35 mg/dL   Calcium 8.6  8.4 - 40.1 mg/dL   Total Protein 5.5 (*) 6.0 - 8.3 g/dL   Albumin 3.1 (*) 3.5 - 5.2 g/dL   AST 16  0 - 37 U/L   ALT 9  0 - 53 U/L   Alkaline Phosphatase 68  39 - 117 U/L   Total Bilirubin 0.3  0.3 - 1.2 mg/dL   GFR calc non Af Amer  78 (*) >90 mL/min   GFR calc Af Amer >90  >90 mL/min  HEMOGLOBIN AND HEMATOCRIT, BLOOD     Status: Normal   Collection Time   10/31/11  1:18 PM      Component Value Range   Hemoglobin 13.3  13.0 - 17.0 g/dL   HCT 02.7  25.3 - 66.4 %    Disposition and follow-up:   Mr.Lyall Marinaro was discharged from the hospital  in good condition.    Diet at Discharge: Heart Healthy   Follow-up Appointments: Dr. Marvell Fuller office will call 9/3 He is to schedule with Dr. Wylene Simmer in w/in 1 month.   Discharge Medications: Medication List  As of 10/31/2011  6:23 PM   TAKE these medications         allopurinol 300 MG tablet   Commonly known as: ZYLOPRIM   Take 300 mg by mouth Daily.      aspirin EC 81 MG tablet   Take 81 mg by mouth daily.      betamethasone (augmented) 0.05 % gel   Commonly known as: DIPROLENE   Apply 1 application topically 2 (two) times daily as needed. Apply twice daily as needed =per pt      clopidogrel 75 MG tablet   Commonly known as: PLAVIX   Take 1 tablet (75 mg total) by mouth daily. RESTART ON 9/2 IF NO MORE BLEEDING      docusate sodium 100 MG capsule   Commonly known as: COLACE   Take 100 mg by mouth daily as needed. For constipation      finasteride 5 MG tablet   Commonly known as: PROSCAR   Take 5 mg by mouth Daily.      fluticasone 50 MCG/ACT nasal spray   Commonly known as: FLONASE   Place 2 sprays into the nose 2 (two) times daily. 2 sprays twice daily each side      hydrocortisone 25 MG suppository   Commonly known as: ANUSOL-HC   Place 1 suppository (25 mg total) rectally at bedtime. For 1 week then use as needed -  mail order rx      hydrocortisone 25 MG suppository   Commonly known as: ANUSOL-HC   Place 1 suppository (25 mg total) rectally at bedtime. - local Rx      levobunolol 0.5 % ophthalmic solution   Commonly known as: BETAGAN   1 drop. Each drop into left eye each morning      lisinopril 40 MG tablet   Commonly known as:  PRINIVIL,ZESTRIL   Take 1 tablet by mouth Daily.      metoprolol succinate 25 MG 24 hr tablet   Commonly known as: TOPROL-XL   Take 1 tablet (25 mg total) by mouth 2 (two) times daily.      nitroGLYCERIN 0.4 MG SL tablet   Commonly known as: NITROSTAT   Place 0.4 mg under the tongue every 5 (five) minutes as needed.      ranitidine 150 MG tablet   Commonly known as: ZANTAC   Take 150 mg by mouth daily as needed. For heartburn      simvastatin 80 MG tablet   Commonly known as: ZOCOR   Take 80 mg by mouth at bedtime.      Tamsulosin HCl 0.4 MG Caps   Commonly known as: FLOMAX   Take 0.4 mg by mouth Daily.      traMADol 50 MG tablet   Commonly known as: ULTRAM   Take 50 mg by mouth every 8 (eight) hours as needed. For pain.      TRAVATAN Z 0.004 % Soln ophthalmic solution   Generic drug: Travoprost (BAK Free)   Place 1 drop into the left eye at bedtime.            Time Spent on discharge: 20 minutes   Signed: Stan Head MD, [facility]  10/31/2011, 6:23 PM

## 2011-11-03 ENCOUNTER — Telehealth: Payer: Self-pay | Admitting: Cardiology

## 2011-11-03 NOTE — Telephone Encounter (Signed)
Error

## 2011-11-04 ENCOUNTER — Telehealth: Payer: Self-pay

## 2011-11-04 NOTE — Telephone Encounter (Signed)
I spoke with the patient's wife.  He was gone to see Dr. Wylene Simmer this am.  She is not aware of any further bleeding, but she will have him call with an update when he gets home.

## 2011-11-04 NOTE — Telephone Encounter (Signed)
Message copied by Annett Fabian on Wed Nov 04, 2011 10:50 AM ------      Message from: Iva Boop      Created: Sat Oct 31, 2011  5:30 PM      Regarding: follow-up call       Please call him to check up on bleeding on 9/3

## 2011-11-05 NOTE — Telephone Encounter (Signed)
ok 

## 2011-11-05 NOTE — Telephone Encounter (Signed)
Patient reports no further bleeding.  He is dong fine

## 2012-04-08 ENCOUNTER — Encounter: Payer: Self-pay | Admitting: Cardiology

## 2012-04-08 ENCOUNTER — Ambulatory Visit (INDEPENDENT_AMBULATORY_CARE_PROVIDER_SITE_OTHER): Payer: Medicare Other | Admitting: Cardiology

## 2012-04-08 VITALS — BP 134/70 | HR 55 | Ht 68.0 in | Wt 176.0 lb

## 2012-04-08 DIAGNOSIS — I2510 Atherosclerotic heart disease of native coronary artery without angina pectoris: Secondary | ICD-10-CM

## 2012-04-08 DIAGNOSIS — I6529 Occlusion and stenosis of unspecified carotid artery: Secondary | ICD-10-CM

## 2012-04-08 DIAGNOSIS — K648 Other hemorrhoids: Secondary | ICD-10-CM

## 2012-04-08 DIAGNOSIS — I10 Essential (primary) hypertension: Secondary | ICD-10-CM

## 2012-04-08 NOTE — Progress Notes (Signed)
HPI The patient presents for evaluation of CAD.   In the 1980s he had an occluded right coronary artery with catheterization in [place]. He was managed medically.  He was seen in the office for chest pain and set up for [facility] on 10/22. LHC 12/22/10: dLM 30%, prox to mid LAD 30%, oOM 95%, AV branches supplied collats to PDA, RCA occluded, EF 65%. He underwent placement of a Promus DES to the OM.   Since last seen the patient has had no new symptoms. He is active walking daily. The patient denies any new symptoms such as chest discomfort, neck or arm discomfort. There has been no new shortness of breath, PND or orthopnea. There have been no reported palpitations, presyncope or syncope.  He stopped his Plavix per my suggestions in the fall. No change in therapy is indicated. I  No Known Allergies  Current Outpatient Prescriptions  Medication Sig Dispense Refill  . allopurinol (ZYLOPRIM) 300 MG tablet Take 300 mg by mouth Daily.       Marland Kitchen aspirin EC 81 MG tablet Take 81 mg by mouth daily.       . betamethasone, augmented, (DIPROLENE) 0.05 % gel Apply 1 application topically 2 (two) times daily as needed. Apply twice daily as needed =per pt      . docusate sodium (COLACE) 100 MG capsule Take 100 mg by mouth daily as needed. For constipation      . finasteride (PROSCAR) 5 MG tablet Take 5 mg by mouth Daily.       . fluticasone (FLONASE) 50 MCG/ACT nasal spray Place 2 sprays into the nose 2 (two) times daily. 2 sprays twice daily each side      . lisinopril (PRINIVIL,ZESTRIL) 40 MG tablet Take 1 tablet by mouth Daily.       . metoprolol succinate (TOPROL XL) 25 MG 24 hr tablet Take 1 tablet (25 mg total) by mouth 2 (two) times daily.  180 tablet  3  . nitroGLYCERIN (NITROSTAT) 0.4 MG SL tablet Place 0.4 mg under the tongue every 5 (five) minutes as needed.        . ranitidine (ZANTAC) 150 MG tablet Take 150 mg by mouth daily as needed. For heartburn      . simvastatin (ZOCOR) 80 MG tablet Take 80 mg by  mouth at bedtime.      . Tamsulosin HCl (FLOMAX) 0.4 MG CAPS Take 0.4 mg by mouth Daily.       . TRAVATAN Z 0.004 % ophthalmic solution Place 1 drop into the left eye at bedtime.         Past Medical History  Diagnosis Date  . GERD (gastroesophageal reflux disease)   . Glaucoma   . Hyperlipidemia   . Hypertension   . BPH (benign prostatic hyperplasia)   . CAD (coronary artery disease)     RCA occluded (Cath [place] 1980s);  LHC 12/22/10: dLM 30%, prox to mid LAD 30%, oOM 95%, AV branches supplied collats to PDA, RCA occluded, EF 65%.  He underwent placement of a Promus DES to the OM.  Marland Kitchen Carotid stenosis     0 - 39% bilateral  . Gout   . Diverticulosis   . Anginal pain 12/2010  . Pneumonia 1950's  . Exertional dyspnea 10/30/2011  . Acute GI bleeding 10/30/2011  . 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 Colonoscopy   . Cystectomy  right cheek  . Transurethral resection of prostate ~ 2007  . Eye muscle surgery 1953    right  . Coronary angioplasty with stent placement 12/2010    "1"   ROS:  As stated in the HPI and negative for all other systems.  PHYSICAL EXAM BP 134/70  Pulse 55  Ht 5\' 8"  (1.727 m)  Wt 176 lb (79.833 kg)  BMI 26.76 kg/m2 GENERAL:  Well appearing HEENT:  Right lens opacification, fundi not visualized, oral mucosa unremarkable NECK:  No jugular venous distention, waveform within normal limits, carotid upstroke brisk and symmetric, no bruits, no thyromegaly LUNGS:  Clear to auscultation bilaterally CHEST:  Unremarkable HEART:  PMI not displaced or sustained,S1 and S2 within normal limits, no S3, no S4, no clicks, no rubs, no murmurs ABD:  Flat, positive bowel sounds normal in frequency in pitch, no bruits, no rebound, no guarding, no midline pulsatile mass, no hepatomegaly, no splenomegaly EXT:  2 plus pulses throughout, no edema, no cyanosis no clubbing    EKG:  Sinus bradycardia, rate  55, voltage criteria for left ventricular hypertrophy, no acute ST-T wave changes.  04/08/2012   ASSESSMENT AND PLAN  CAD (coronary artery disease) -  The patient has no new sypmtoms.  No further cardiovascular testing is indicated.  We will continue with aggressive risk reduction and meds as listed.    HTN (hypertension) -  The blood pressure is at target. No change in medications is indicated. We will continue with therapeutic lifestyle changes (TLC).   Carotid stenosis -  He had mild plaquing 2012 and will have repeat Dopplers in November of this year.  Dyslipidemia -  His lipids are doing in April of this year and he will forward those results to me.

## 2012-04-08 NOTE — Patient Instructions (Addendum)
The current medical regimen is effective;  continue present plan and medications.  Your physician has requested that you have a carotid duplex in November 2014.  You will be contacted to schedule this appointment. This test is an ultrasound of the carotid arteries in your neck. It looks at blood flow through these arteries that supply the brain with blood. Allow one hour for this exam. There are no restrictions or special instructions.  Follow up in 1 year with Dr Antoine Poche.  You will receive a letter in the mail 2 months before you are due.  Please call us when you receive this letter to schedule your follow up appointment.

## 2012-07-11 ENCOUNTER — Telehealth: Payer: Self-pay | Admitting: Cardiology

## 2012-07-11 NOTE — Telephone Encounter (Signed)
New problem     Metoprolol succinate has been move to tier 1  To tier 2 .   Phone [phone] .

## 2012-07-26 MED ORDER — METOPROLOL TARTRATE 25 MG PO TABS: 25.0000 mg | ORAL_TABLET | Freq: Two times a day (BID) | ORAL | Status: DC

## 2012-07-26 NOTE — Telephone Encounter (Signed)
RX changed to tartrate 25 mg BID per Dr Antoine Poche.

## 2012-08-16 ENCOUNTER — Observation Stay (HOSPITAL_COMMUNITY)
Admission: EM | Admit: 2012-08-16 | Discharge: 2012-08-17 | Disposition: A | Discharge status: Home / Self Care | Payer: Medicare Other | Attending: Internal Medicine | Admitting: Internal Medicine

## 2012-08-16 ENCOUNTER — Encounter (HOSPITAL_COMMUNITY): Payer: Self-pay | Admitting: Nurse Practitioner

## 2012-08-16 ENCOUNTER — Emergency Department (HOSPITAL_COMMUNITY): Payer: Medicare Other

## 2012-08-16 DIAGNOSIS — Y9301 Activity, walking, marching and hiking: Secondary | ICD-10-CM | POA: Insufficient documentation

## 2012-08-16 DIAGNOSIS — Z9861 Coronary angioplasty status: Secondary | ICD-10-CM | POA: Insufficient documentation

## 2012-08-16 DIAGNOSIS — S0180XA Unspecified open wound of other part of head, initial encounter: Secondary | ICD-10-CM | POA: Insufficient documentation

## 2012-08-16 DIAGNOSIS — I10 Essential (primary) hypertension: Secondary | ICD-10-CM | POA: Insufficient documentation

## 2012-08-16 DIAGNOSIS — I6200 Nontraumatic subdural hemorrhage, unspecified: Secondary | ICD-10-CM

## 2012-08-16 DIAGNOSIS — Z79899 Other long term (current) drug therapy: Secondary | ICD-10-CM | POA: Insufficient documentation

## 2012-08-16 DIAGNOSIS — W010XXA Fall on same level from slipping, tripping and stumbling without subsequent striking against object, initial encounter: Secondary | ICD-10-CM | POA: Insufficient documentation

## 2012-08-16 DIAGNOSIS — S065X9A Traumatic subdural hemorrhage with loss of consciousness of unspecified duration, initial encounter: Secondary | ICD-10-CM

## 2012-08-16 DIAGNOSIS — S065X0A Traumatic subdural hemorrhage without loss of consciousness, initial encounter: Principal | ICD-10-CM | POA: Insufficient documentation

## 2012-08-16 DIAGNOSIS — S01501A Unspecified open wound of lip, initial encounter: Secondary | ICD-10-CM | POA: Insufficient documentation

## 2012-08-16 DIAGNOSIS — S0003XA Contusion of scalp, initial encounter: Secondary | ICD-10-CM | POA: Insufficient documentation

## 2012-08-16 DIAGNOSIS — S0181XA Laceration without foreign body of other part of head, initial encounter: Secondary | ICD-10-CM

## 2012-08-16 DIAGNOSIS — I2510 Atherosclerotic heart disease of native coronary artery without angina pectoris: Secondary | ICD-10-CM | POA: Insufficient documentation

## 2012-08-16 LAB — CBC WITH DIFFERENTIAL/PLATELET
Eosinophils Relative: 2 % (ref 0–5)
HCT: 40.8 % (ref 39.0–52.0)
Lymphocytes Relative: 14 % (ref 12–46)
Lymphs Abs: 1 10*3/uL (ref 0.7–4.0)
MCV: 93.2 fL (ref 78.0–100.0)
Neutro Abs: 5.3 10*3/uL (ref 1.7–7.7)
Platelets: 170 10*3/uL (ref 150–400)
RBC: 4.38 MIL/uL (ref 4.22–5.81)
WBC: 7.3 10*3/uL (ref 4.0–10.5)

## 2012-08-16 LAB — COMPREHENSIVE METABOLIC PANEL
ALT: 10 U/L (ref 0–53)
AST: 21 U/L (ref 0–37)
Alkaline Phosphatase: 90 U/L (ref 39–117)
CO2: 28 mEq/L (ref 19–32)
Calcium: 9.3 mg/dL (ref 8.4–10.5)
Chloride: 99 mEq/L (ref 96–112)
GFR calc Af Amer: 90 mL/min — ABNORMAL LOW (ref >90)
GFR calc non Af Amer: 77 mL/min — ABNORMAL LOW (ref >90)
Glucose, Bld: 111 mg/dL — ABNORMAL HIGH (ref 70–99)
Sodium: 136 mEq/L (ref 135–145)
Total Bilirubin: 0.5 mg/dL (ref 0.3–1.2)

## 2012-08-16 MED ORDER — SODIUM CHLORIDE 0.9 % IJ SOLN: 3.0000 mL | INTRAMUSCULAR | Status: DC | PRN

## 2012-08-16 MED ORDER — BRIMONIDINE TARTRATE 0.2 % OP SOLN
1.0000 [drp] | Freq: Two times a day (BID) | OPHTHALMIC | Status: DC
  Administered 2012-08-16 – 2012-08-17 (×2): 1 [drp] via OPHTHALMIC
  Filled 2012-08-16: qty 5

## 2012-08-16 MED ORDER — ZOLPIDEM TARTRATE 5 MG PO TABS: 5.0000 mg | ORAL_TABLET | Freq: Every evening | ORAL | Status: DC | PRN

## 2012-08-16 MED ORDER — SODIUM CHLORIDE 0.9 % IV SOLN: 250.0000 mL | INTRAVENOUS | Status: DC | PRN

## 2012-08-16 MED ORDER — FLUTICASONE PROPIONATE 50 MCG/ACT NA SUSP: 2.0000 | NASAL | Status: DC | PRN

## 2012-08-16 MED ORDER — HYDROCHLOROTHIAZIDE 25 MG PO TABS
25.0000 mg | ORAL_TABLET | Freq: Every day | ORAL | Status: DC
  Administered 2012-08-17: 25 mg via ORAL
  Filled 2012-08-16: qty 1

## 2012-08-16 MED ORDER — NITROGLYCERIN 0.4 MG SL SUBL: 0.4000 mg | SUBLINGUAL_TABLET | SUBLINGUAL | Status: DC | PRN

## 2012-08-16 MED ORDER — ONDANSETRON 4 MG PO TBDP
4.0000 mg | ORAL_TABLET | Freq: Once | ORAL | Status: AC
  Administered 2012-08-16: 4 mg via ORAL
  Filled 2012-08-16: qty 1

## 2012-08-16 MED ORDER — FAMOTIDINE 20 MG PO TABS
20.0000 mg | ORAL_TABLET | Freq: Every day | ORAL | Status: DC
  Administered 2012-08-16 – 2012-08-17 (×2): 20 mg via ORAL
  Filled 2012-08-16 (×2): qty 1

## 2012-08-16 MED ORDER — TRAMADOL HCL 50 MG PO TABS
50.0000 mg | ORAL_TABLET | Freq: Four times a day (QID) | ORAL | Status: DC | PRN
  Administered 2012-08-16 – 2012-08-17 (×2): 50 mg via ORAL
  Filled 2012-08-16 (×2): qty 1

## 2012-08-16 MED ORDER — TRAVOPROST (BAK FREE) 0.004 % OP SOLN
1.0000 [drp] | Freq: Every day | OPHTHALMIC | Status: DC
  Administered 2012-08-16: 1 [drp] via OPHTHALMIC
  Filled 2012-08-16: qty 2.5

## 2012-08-16 MED ORDER — LISINOPRIL 40 MG PO TABS
40.0000 mg | ORAL_TABLET | Freq: Every day | ORAL | Status: DC
  Filled 2012-08-16: qty 1

## 2012-08-16 MED ORDER — ASPIRIN EC 81 MG PO TBEC: 81.0000 mg | DELAYED_RELEASE_TABLET | Freq: Every day | ORAL | Status: DC

## 2012-08-16 MED ORDER — TAMSULOSIN HCL 0.4 MG PO CAPS
0.4000 mg | ORAL_CAPSULE | Freq: Every day | ORAL | Status: DC
  Filled 2012-08-16 (×2): qty 1

## 2012-08-16 MED ORDER — SODIUM CHLORIDE 0.9 % IJ SOLN
3.0000 mL | Freq: Two times a day (BID) | INTRAMUSCULAR | Status: DC
  Administered 2012-08-16 – 2012-08-17 (×2): 3 mL via INTRAVENOUS

## 2012-08-16 MED ORDER — ALLOPURINOL 300 MG PO TABS
300.0000 mg | ORAL_TABLET | Freq: Every day | ORAL | Status: DC
  Administered 2012-08-17: 300 mg via ORAL
  Filled 2012-08-16: qty 1

## 2012-08-16 MED ORDER — SODIUM CHLORIDE 0.9 % IJ SOLN: 3.0000 mL | Freq: Two times a day (BID) | INTRAMUSCULAR | Status: DC

## 2012-08-16 MED ORDER — HYDROMORPHONE HCL PF 1 MG/ML IJ SOLN: 0.5000 mg | INTRAMUSCULAR | Status: DC | PRN

## 2012-08-16 MED ORDER — MORPHINE SULFATE 4 MG/ML IJ SOLN
4.0000 mg | Freq: Once | INTRAMUSCULAR | Status: AC
  Administered 2012-08-16: 4 mg via INTRAMUSCULAR
  Filled 2012-08-16 (×2): qty 1

## 2012-08-16 MED ORDER — BRIMONIDINE TARTRATE 0.1 % OP SOLN: 1.0000 [drp] | Freq: Two times a day (BID) | OPHTHALMIC | Status: DC

## 2012-08-16 MED ORDER — ATORVASTATIN CALCIUM 40 MG PO TABS
40.0000 mg | ORAL_TABLET | Freq: Every day | ORAL | Status: DC
  Administered 2012-08-16: 40 mg via ORAL
  Filled 2012-08-16 (×2): qty 1

## 2012-08-16 MED ORDER — METOPROLOL TARTRATE 25 MG PO TABS
25.0000 mg | ORAL_TABLET | Freq: Two times a day (BID) | ORAL | Status: DC
Start: 2012-08-16 — End: 2012-08-17
  Administered 2012-08-16 – 2012-08-17 (×2): 25 mg via ORAL
  Filled 2012-08-16 (×3): qty 1

## 2012-08-16 MED ORDER — FINASTERIDE 5 MG PO TABS
5.0000 mg | ORAL_TABLET | Freq: Every day | ORAL | Status: DC
  Administered 2012-08-16 – 2012-08-17 (×2): 5 mg via ORAL
  Filled 2012-08-16 (×2): qty 1

## 2012-08-16 MED ORDER — ONDANSETRON HCL 4 MG/2ML IJ SOLN: 4.0000 mg | Freq: Three times a day (TID) | INTRAMUSCULAR | Status: AC | PRN

## 2012-08-16 NOTE — ED Notes (Signed)
Pt reports he was on his morning walk and tripped on a crack in the sidewalk, flipping forward and landed on head. Pt states he thinks his tooth went into his upper lip, dried blood noted around lips, states his front tooth is tender but does not feel loose. 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 MAE, ambulatory, denies LOC. Pt states he is not on blood thinners, denies other injuries. A&Ox4

## 2012-08-16 NOTE — ED Provider Notes (Signed)
LACERATION REPAIR Performed by: Tana Conch Authorized by: Tana Conch Consent: Verbal consent obtained. Risks and benefits: risks, benefits and alternatives were discussed Consent given by: patient Patient identity confirmed: provided demographic data Prepped and Draped in normal sterile fashion Wound explored  Laceration Location: right forehead above eyebrow  Laceration Length: 3cm  No Foreign Bodies seen or palpated  Anesthesia: local infiltration  Local anesthetic: lidocaine 2%  epinephrine  Anesthetic total: 8 ml  Irrigation method: syringe Amount of cleaning: standard  Skin closure: Prolene  Number of sutures: 3  Technique: simple interrupted   Patient tolerance: Patient tolerated the procedure well with no immediate complications.   Shelva Majestic, MD 08/16/12 [phone]

## 2012-08-16 NOTE — ED Notes (Signed)
Coffee given with gram crackers, lunch tray ordered

## 2012-08-16 NOTE — ED Provider Notes (Signed)
History     CSN: [id number]  Arrival date & time 08/16/12  0910   First MD Initiated Contact with Patient 08/16/12 0945      Chief Complaint  Patient presents with  . Fall    (Consider location/radiation/quality/duration/timing/severity/associated sxs/prior treatment) HPI  [name] is a 79 y.o.male presenting to the ER with complaints of a mechanical fall just prior to arrival. He was outside doing his daily 45 minute walk when he tripped on a crack landing on his face and left hand. He sustained a laceration to his right eyebrow, a nose bleed, and a lip laceration. He denies loc, neck pain or head pain. He is not on blood thinners nor has he had any vomiting, dizziness or confusion since the incident. His wife is in the room with him and she brought him to the ER. 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   Past Medical History  Diagnosis Date  . GERD (gastroesophageal reflux disease)   . Glaucoma   . Hyperlipidemia   . Hypertension   . BPH (benign prostatic hyperplasia)   . CAD (coronary artery disease)     RCA occluded ([facility] [place] 1980s);  LHC 12/22/10: dLM 30%, prox to mid LAD 30%, oOM 95%, AV branches supplied collats to PDA, RCA occluded, EF 65%.  He underwent placement of a Promus DES to the OM.  Marland Kitchen Carotid stenosis     0 - 39% bilateral  . Gout   . Diverticulosis   . Anginal pain 12/2010  . Pneumonia 1950's  . Exertional dyspnea 10/30/2011  . Acute GI bleeding 10/30/2011  . 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 Colonoscopy    . Cystectomy      right cheek  . Transurethral resection of prostate  ~ 2007  . Eye muscle surgery  1953    right  . Coronary angioplasty with stent placement  12/2010    "1"    Family History  Problem Relation Age of Onset  . Colon cancer Neg Hx   . Prostate cancer Father 9  . Cancer Mother     Stomach    History  Substance Use Topics  . Smoking  status: Former Smoker -- 3.00 packs/day for 14 years    Types: Cigarettes    Quit date: 12/12/1966  . Smokeless tobacco: Never Used  . Alcohol Use: Yes     Comment: 10/30/2011 "stopped all alcohol 2007; used to drink too much"      Review of Systems  HENT: Positive for nosebleeds and facial swelling. Negative for neck pain.   Skin: Positive for wound.  All other systems reviewed and are negative.       Allergies  Review of patient's allergies indicates no known allergies.  Home Medications   Current Outpatient Rx  Name  Route  Sig  Dispense  Refill  . allopurinol (ZYLOPRIM) 300 MG tablet   Oral   Take 300 mg by mouth Daily.          Marland Kitchen aspirin EC 81 MG tablet   Oral   Take 81 mg by mouth daily.          . brimonidine (ALPHAGAN P) 0.1 % SOLN   Left Eye   Place 1 drop into the left eye 2 (two) times daily.         Marland Kitchen docusate sodium (COLACE) 100 MG capsule  Oral   Take 100 mg by mouth daily as needed. For constipation         . finasteride (PROSCAR) 5 MG tablet   Oral   Take 5 mg by mouth Daily.          . fluticasone (FLONASE) 50 MCG/ACT nasal spray   Nasal   Place 2 sprays into the nose as needed for allergies. 2 sprays twice daily each side         . hydrochlorothiazide (HYDRODIURIL) 25 MG tablet   Oral   Take 12.5-25 mg by mouth daily.         . hydrocortisone cream 1 %   Topical   Apply 1 application topically as needed (itching).         Marland Kitchen ibuprofen (ADVIL,MOTRIN) 200 MG tablet   Oral   Take 200 mg by mouth every 6 (six) hours as needed for pain.         Marland Kitchen lisinopril (PRINIVIL,ZESTRIL) 40 MG tablet   Oral   Take 1 tablet by mouth Daily.          . metoprolol tartrate (LOPRESSOR) 25 MG tablet   Oral   Take 1 tablet (25 mg total) by mouth 2 (two) times daily.   180 tablet   3   . nitroGLYCERIN (NITROSTAT) 0.4 MG SL tablet   Sublingual   Place 0.4 mg under the tongue every 5 (five) minutes as needed.           Marland Kitchen  PE-Shark Liver Oil-Cocoa Buttr (HEMORRHOID RE)   Rectal   Place 1 application rectally as needed (hemorrhoids). A ointment         . ranitidine (ZANTAC) 150 MG tablet   Oral   Take 150 mg by mouth daily as needed. For heartburn         . simvastatin (ZOCOR) 80 MG tablet   Oral   Take 80 mg by mouth at bedtime.         . Tamsulosin HCl (FLOMAX) 0.4 MG CAPS   Oral   Take 0.4 mg by mouth Daily.          . TRAVATAN Z 0.004 % ophthalmic solution   Left Eye   Place 1 drop into the left eye at bedtime.            BP 134/73  Pulse 63  Temp(Src) 97.8 F (36.6 C) (Oral)  Resp 16  SpO2 97%  Physical Exam  Nursing note and vitals reviewed. Constitutional: He is oriented to person, place, and time. He appears well-developed and well-nourished. No distress.  HENT:  Head: Normocephalic. Head is with raccoon's eyes (right), with contusion, with laceration and with right periorbital erythema.    Right Ear: Tympanic membrane normal.  Left Ear: Tympanic membrane normal.  Nose: Sinus tenderness present. Epistaxis is observed.  Mouth/Throat: Lacerations present.    Eyes: Pupils are equal, round, and reactive to light.  Blind in right eye. No left globe abrnomality  Neck: Normal range of motion. Neck supple. No spinous process tenderness and no muscular tenderness present.  Cardiovascular: Normal rate and regular rhythm.   Pulmonary/Chest: Effort normal.  Abdominal: Soft.  Neurological: He is alert and oriented to person, place, and time. He has normal strength. No cranial nerve deficit or sensory deficit. He displays a negative Romberg sign.  Skin: Skin is warm and dry.    ED Course  Procedures (including critical care time)  Labs Reviewed  COMPREHENSIVE METABOLIC PANEL -  Abnormal; Notable for the following:    Glucose, Bld 111 (*)    GFR calc non Af Amer 77 (*)    GFR calc Af Amer 90 (*)    All other components within normal limits  CBC WITH DIFFERENTIAL    PROTIME-INR   Ct Head Wo Contrast  08/16/2012   *RADIOLOGY REPORT*  Clinical Data:  FELL.  HIT HEAD.  CT HEAD WITHOUT CONTRAST CT MAXILLOFACIAL WITHOUT CONTRAST CT CERVICAL SPINE WITHOUT CONTRAST  Technique:  Multidetector CT imaging of the head, cervical spine, and maxillofacial structures were performed using the standard protocol without intravenous contrast. Multiplanar CT image reconstructions of the cervical spine and maxillofacial structures were also generated.  Comparison:   None  CT HEAD  Findings: There is a supraorbital and right frontal scalp hematoma without underlying skull fracture.  The orbital bones appear intact.  The globes are intact.  There is dense calcification and deformity involving the right globe.  Mild age related cerebral atrophy, ventriculomegaly and periventricular white matter disease.  There is a small right-sided subdural hematoma extending into the interhemispheric fissure anteriorly.  No mass effect or midline shift.  IMPRESSION:  1.  Small right-sided subdural hematoma extending into the anterior interhemispheric fissure.  No mass effect or midline shift. 2.  Right supraorbital and frontal scalp hematoma without underlying skull fracture.  CT MAXILLOFACIAL  Findings:  No acute facial bone fractures.  The supraorbital and right frontal scalp hematoma without underlying fractures.  The right globe is mildly deformed and there are dense calcifications. The paranasal sinuses are grossly clear.  There is chronic left maxillary sinus disease.  The mandibular condyles are normally located.  No mandible fracture.  No nasal bone fractures.  IMPRESSION:  No acute facial bone fractures.  CT CERVICAL SPINE  Findings:   Severe degenerative cervical spondylosis with multilevel disc disease and facet disease.  There are multilevel auto fusions. The C1-2 pannus is noted with mild impression on the upper cervical cord.  The overall alignment is grossly normal.  No obvious acute fracture or  abnormal prevertebral soft tissue swelling.  The lung apices are grossly clear.  IMPRESSION:  1.  Severe degenerative cervical spondylosis with severe multilevel disc disease and facet disease. 2.  No acute fracture.   Original Report Authenticated By: Rudie Meyer, M.D.   Ct Cervical Spine Wo Contrast  08/16/2012   *RADIOLOGY REPORT*  Clinical Data:  FELL.  HIT HEAD.  CT HEAD WITHOUT CONTRAST CT MAXILLOFACIAL WITHOUT CONTRAST CT CERVICAL SPINE WITHOUT CONTRAST  Technique:  Multidetector CT imaging of the head, cervical spine, and maxillofacial structures were performed using the standard protocol without intravenous contrast. Multiplanar CT image reconstructions of the cervical spine and maxillofacial structures were also generated.  Comparison:   None  CT HEAD  Findings: There is a supraorbital and right frontal scalp hematoma without underlying skull fracture.  The orbital bones appear intact.  The globes are intact.  There is dense calcification and deformity involving the right globe.  Mild age related cerebral atrophy, ventriculomegaly and periventricular white matter disease.  There is a small right-sided subdural hematoma extending into the interhemispheric fissure anteriorly.  No mass effect or midline shift.  IMPRESSION:  1.  Small right-sided subdural hematoma extending into the anterior interhemispheric fissure.  No mass effect or midline shift. 2.  Right supraorbital and frontal scalp hematoma without underlying skull fracture.  CT MAXILLOFACIAL  Findings:  No acute facial bone fractures.  The supraorbital and right frontal scalp  hematoma without underlying fractures.  The right globe is mildly deformed and there are dense calcifications. The paranasal sinuses are grossly clear.  There is chronic left maxillary sinus disease.  The mandibular condyles are normally located.  No mandible fracture.  No nasal bone fractures.  IMPRESSION:  No acute facial bone fractures.  CT CERVICAL SPINE  Findings:    Severe degenerative cervical spondylosis with multilevel disc disease and facet disease.  There are multilevel auto fusions. The C1-2 pannus is noted with mild impression on the upper cervical cord.  The overall alignment is grossly normal.  No obvious acute fracture or abnormal prevertebral soft tissue swelling.  The lung apices are grossly clear.  IMPRESSION:  1.  Severe degenerative cervical spondylosis with severe multilevel disc disease and facet disease. 2.  No acute fracture.   Original Report Authenticated By: Rudie Meyer, M.D.   Dg Hand Complete Left  08/16/2012   *RADIOLOGY REPORT*  Clinical Data: Anterior left hand pain, laceration and bruising following a fall today.  LEFT HAND - COMPLETE 3+ VIEW  Comparison: Left wrist dated 02/15/2009.  Findings: Old, healed ulnar styloid fracture.  No acute fracture or dislocation. Calcification adjacent to the radial aspect of the second metacarpal head.  IMPRESSION: No fracture.   Original Report Authenticated By: Beckie Salts, M.D.   Ct Maxillofacial Wo Cm  08/16/2012   *RADIOLOGY REPORT*  Clinical Data:  FELL.  HIT HEAD.  CT HEAD WITHOUT CONTRAST CT MAXILLOFACIAL WITHOUT CONTRAST CT CERVICAL SPINE WITHOUT CONTRAST  Technique:  Multidetector CT imaging of the head, cervical spine, and maxillofacial structures were performed using the standard protocol without intravenous contrast. Multiplanar CT image reconstructions of the cervical spine and maxillofacial structures were also generated.  Comparison:   None  CT HEAD  Findings: There is a supraorbital and right frontal scalp hematoma without underlying skull fracture.  The orbital bones appear intact.  The globes are intact.  There is dense calcification and deformity involving the right globe.  Mild age related cerebral atrophy, ventriculomegaly and periventricular white matter disease.  There is a small right-sided subdural hematoma extending into the interhemispheric fissure anteriorly.  No mass effect or  midline shift.  IMPRESSION:  1.  Small right-sided subdural hematoma extending into the anterior interhemispheric fissure.  No mass effect or midline shift. 2.  Right supraorbital and frontal scalp hematoma without underlying skull fracture.  CT MAXILLOFACIAL  Findings:  No acute facial bone fractures.  The supraorbital and right frontal scalp hematoma without underlying fractures.  The right globe is mildly deformed and there are dense calcifications. The paranasal sinuses are grossly clear.  There is chronic left maxillary sinus disease.  The mandibular condyles are normally located.  No mandible fracture.  No nasal bone fractures.  IMPRESSION:  No acute facial bone fractures.  CT CERVICAL SPINE  Findings:   Severe degenerative cervical spondylosis with multilevel disc disease and facet disease.  There are multilevel auto fusions. The C1-2 pannus is noted with mild impression on the upper cervical cord.  The overall alignment is grossly normal.  No obvious acute fracture or abnormal prevertebral soft tissue swelling.  The lung apices are grossly clear.  IMPRESSION:  1.  Severe degenerative cervical spondylosis with severe multilevel disc disease and facet disease. 2.  No acute fracture.   Original Report Authenticated By: Rudie Meyer, M.D.     1. Subdural hematoma, post-traumatic, initial encounter   2. Fall due to stumbling, initial encounter   3. Facial laceration, initial  encounter       MDM  CT head,neck and maxillofacial. Basic labs ordered as well as pain medication.  Resident to suture lacerations  11:30am - images of Head CT show that the patient has a small right sided subdural hematoma extending into the interhemispheric fissure. No mass effect or midline shift. Right supraorbital and frontal scalp hematoma without underlying fracture. Discussed case with Dr. Clarene Duke who has seen the patient as well. Will consult Neurosurgery.  11:46 am-  I spoke with Dr.Elsner - neurosurgery who  says the patient needs to be observed but that no surgical intervention is needed. I requested that he come to the ER to lay eyes on the patient and he was agreeable.   12:40pm- Temp admission orders put in for Dr. Link Snuffer who has agreed to observe the patient and monitor the subdural hematoma.  CRITICAL CARE Performed by: Dorthula Matas Total critical care time: 35 Critical care time was exclusive of separately billable procedures and treating other patients. Critical care was necessary to treat or prevent imminent or life-threatening deterioration. Critical care was time spent personally by me on the following activities: development of treatment plan with patient and/or surrogate as well as nursing, discussions with consultants, evaluation of patient's response to treatment, examination of patient, obtaining history from patient or surrogate, ordering and performing treatments and interventions, ordering and review of laboratory studies, ordering and review of radiographic studies, pulse oximetry and re-evaluation of patient's condition.   Dorthula Matas, PA-C 08/16/12 1240  Dorthula Matas, PA-C 08/16/12 1251

## 2012-08-16 NOTE — Progress Notes (Signed)
Received the pt from ED around 1600 hrs, pt is fully alert and orient, walked to the bed- steady. Vitals signs checked, temp is 99.4. Has abrasions on rt side of face with some sutures. Educate the pt about fall prevention protocols, pt verbalized the understanding to call prior to get up. Started on Tele and informed CCMD. Will keep monitoring.  [name]

## 2012-08-16 NOTE — H&P (Signed)
Physician Admission History and Physical     PCP:   Gaspar Garbe, MD   Chief Complaint:  Subdural noted after mechanical fall   HPI: [name] is an 79 y.o. male.  Pt w/ hx of HTN, HLD, BPH and gout who presents to ED after suffering mechanical fall. CT of head, C spine, maxilofacial and wrist were obtained. Unremarkable except for the presence of a small SDH w/o mass effect or midline shift and a scalp hematoma w/o fracture. ED PA states NSG was consulted who stated there was nothing warranted from them at this time, but that admitting to observation overnight would be appropriate. Rec'd holding ASA 81 for a few weeks as well.   Pt currently complains of facial pain and HA that was relieved w/ tylenol   Review of Systems:  ROS negative except as noted above  Past Medical History: Past Medical History  Diagnosis Date  . GERD (gastroesophageal reflux disease)   . Glaucoma   . Hyperlipidemia   . Hypertension   . BPH (benign prostatic hyperplasia)   . CAD (coronary artery disease)     RCA occluded ([facility] [place] 1980s);  LHC 12/22/10: dLM 30%, prox to mid LAD 30%, oOM 95%, AV branches supplied collats to PDA, RCA occluded, EF 65%.  He underwent placement of a Promus DES to the OM.  Marland Kitchen Carotid stenosis     0 - 39% bilateral  . Gout   . Diverticulosis   . Anginal pain 12/2010  . Pneumonia 1950's  . Exertional dyspnea 10/30/2011  . Acute GI bleeding 10/30/2011  . Arthritis     "knees and left shoulder is completely shot"   Past Surgical History  Procedure Laterality Date  . Colonoscopy    . Cystectomy      right cheek  . Transurethral resection of prostate  ~ 2007  . Eye muscle surgery  1953    right  . Coronary angioplasty with stent placement  12/2010    "1"    Medications: Prior to Admission medications   Medication Sig Start Date End Date Taking? Authorizing Provider  allopurinol (ZYLOPRIM) 300 MG tablet Take 300 mg by mouth Daily.  09/11/10  Yes Historical  Provider, MD  aspirin EC 81 MG tablet Take 81 mg by mouth daily.    Yes Historical Provider, MD  brimonidine (ALPHAGAN P) 0.1 % SOLN Place 1 drop into the left eye 2 (two) times daily.   Yes Historical Provider, MD  docusate sodium (COLACE) 100 MG capsule Take 100 mg by mouth daily as needed. For constipation   Yes Historical Provider, MD  finasteride (PROSCAR) 5 MG tablet Take 5 mg by mouth Daily.  09/12/10  Yes Historical Provider, MD  fluticasone (FLONASE) 50 MCG/ACT nasal spray Place 2 sprays into the nose as needed for allergies. 2 sprays twice daily each side 09/14/10  Yes Historical Provider, MD  hydrochlorothiazide (HYDRODIURIL) 25 MG tablet Take 12.5-25 mg by mouth daily.   Yes Historical Provider, MD  hydrocortisone cream 1 % Apply 1 application topically as needed (itching).   Yes Historical Provider, MD  ibuprofen (ADVIL,MOTRIN) 200 MG tablet Take 200 mg by mouth every 6 (six) hours as needed for pain.   Yes Historical Provider, MD  lisinopril (PRINIVIL,ZESTRIL) 40 MG tablet Take 1 tablet by mouth Daily.    Yes Historical Provider, MD  metoprolol tartrate (LOPRESSOR) 25 MG tablet Take 1 tablet (25 mg total) by mouth 2 (two) times daily. 07/26/12  Yes Rollene Rotunda, MD  nitroGLYCERIN (NITROSTAT) 0.4 MG SL tablet Place 0.4 mg under the tongue every 5 (five) minutes as needed.     Yes Historical Provider, MD  PE-Shark Liver Oil-Cocoa Buttr (HEMORRHOID RE) Place 1 application rectally as needed (hemorrhoids). A ointment   Yes Historical Provider, MD  ranitidine (ZANTAC) 150 MG tablet Take 150 mg by mouth daily as needed. For heartburn   Yes Historical Provider, MD  simvastatin (ZOCOR) 80 MG tablet Take 80 mg by mouth at bedtime.   Yes Historical Provider, MD  Tamsulosin HCl (FLOMAX) 0.4 MG CAPS Take 0.4 mg by mouth Daily.    Yes Historical Provider, MD  TRAVATAN Z 0.004 % ophthalmic solution Place 1 drop into the left eye at bedtime.  07/29/10  Yes Historical Provider, MD    Allergies:  No  Known Allergies  Social History:  reports that he quit smoking about 45 years ago. His smoking use included Cigarettes. He has a 42 pack-year smoking history. He has never used smokeless tobacco. He reports that  drinks alcohol. He reports that he does not use illicit drugs.  Family History: Family History  Problem Relation Age of Onset  . Colon cancer Neg Hx   . Prostate cancer Father 60  . Cancer Mother     Stomach    Physical Exam: Filed Vitals:   08/16/12 0915 08/16/12 0920 08/16/12 0930  BP: 146/75 145/78 134/73  Pulse:  61 63  Temp:  97.8 F (36.6 C)   TempSrc:  Oral   Resp:  16   SpO2:  96% 97%   Gen: WM in NAD  HEENT: large ecchymosis around the R eye. R eye swollen shut. L eye PERRL, EOMI . Uvula and tongue midline. MMM.  Lungs:CTAB, no wheezes or rales  Cardio: RRR, no MRG, no peripheral edema, 2+ radial and DP pulses  Abd: nontender, nondistended, normal BS, no HSM  MSK: 5/5 strength of UE/LE. Normal reflexes  Neuro: CN II-XII grossly intact. No focal deficits in strength or reflexes  Skin: ecchymosis on face     Labs on Admission:   Recent Labs  08/16/12 0958  NA 136  K 4.1  CL 99  CO2 28  GLUCOSE 111*  BUN 13  CREATININE 0.93  CALCIUM 9.3    Recent Labs  08/16/12 0958  AST 21  ALT 10  ALKPHOS 90  BILITOT 0.5  PROT 6.5  ALBUMIN 3.7   No results found for this basename: LIPASE, AMYLASE,  in the last 72 hours  Recent Labs  08/16/12 0958  WBC 7.3  NEUTROABS 5.3  HGB 13.7  HCT 40.8  MCV 93.2  PLT 170   No results found for this basename: CKTOTAL, CKMB, CKMBINDEX, TROPONINI,  in the last 72 hours Lab Results  Component Value Date   INR 0.94 08/16/2012   INR 1.08 10/30/2011   INR 1.0 12/17/2010   No results found for this basename: TSH, T4TOTAL, FREET3, T3FREE, THYROIDAB,  in the last 72 hours No results found for this basename: VITAMINB12, FOLATE, FERRITIN, TIBC, IRON, RETICCTPCT,  in the last 72 hours  Radiological Exams on  Admission: Ct Head Wo Contrast  08/16/2012   *RADIOLOGY REPORT*  Clinical Data:  FELL.  HIT HEAD.  CT HEAD WITHOUT CONTRAST CT MAXILLOFACIAL WITHOUT CONTRAST CT CERVICAL SPINE WITHOUT CONTRAST  Technique:  Multidetector CT imaging of the head, cervical spine, and maxillofacial structures were performed using the standard protocol without intravenous contrast. Multiplanar CT image reconstructions of the cervical spine and maxillofacial  structures were also generated.  Comparison:   None  CT HEAD  Findings: There is a supraorbital and right frontal scalp hematoma without underlying skull fracture.  The orbital bones appear intact.  The globes are intact.  There is dense calcification and deformity involving the right globe.  Mild age related cerebral atrophy, ventriculomegaly and periventricular white matter disease.  There is a small right-sided subdural hematoma extending into the interhemispheric fissure anteriorly.  No mass effect or midline shift.  IMPRESSION:  1.  Small right-sided subdural hematoma extending into the anterior interhemispheric fissure.  No mass effect or midline shift. 2.  Right supraorbital and frontal scalp hematoma without underlying skull fracture.  CT MAXILLOFACIAL  Findings:  No acute facial bone fractures.  The supraorbital and right frontal scalp hematoma without underlying fractures.  The right globe is mildly deformed and there are dense calcifications. The paranasal sinuses are grossly clear.  There is chronic left maxillary sinus disease.  The mandibular condyles are normally located.  No mandible fracture.  No nasal bone fractures.  IMPRESSION:  No acute facial bone fractures.  CT CERVICAL SPINE  Findings:   Severe degenerative cervical spondylosis with multilevel disc disease and facet disease.  There are multilevel auto fusions. The C1-2 pannus is noted with mild impression on the upper cervical cord.  The overall alignment is grossly normal.  No obvious acute fracture or  abnormal prevertebral soft tissue swelling.  The lung apices are grossly clear.  IMPRESSION:  1.  Severe degenerative cervical spondylosis with severe multilevel disc disease and facet disease. 2.  No acute fracture.   Original Report Authenticated By: Rudie Meyer, M.D.   Ct Cervical Spine Wo Contrast  08/16/2012   *RADIOLOGY REPORT*  Clinical Data:  FELL.  HIT HEAD.  CT HEAD WITHOUT CONTRAST CT MAXILLOFACIAL WITHOUT CONTRAST CT CERVICAL SPINE WITHOUT CONTRAST  Technique:  Multidetector CT imaging of the head, cervical spine, and maxillofacial structures were performed using the standard protocol without intravenous contrast. Multiplanar CT image reconstructions of the cervical spine and maxillofacial structures were also generated.  Comparison:   None  CT HEAD  Findings: There is a supraorbital and right frontal scalp hematoma without underlying skull fracture.  The orbital bones appear intact.  The globes are intact.  There is dense calcification and deformity involving the right globe.  Mild age related cerebral atrophy, ventriculomegaly and periventricular white matter disease.  There is a small right-sided subdural hematoma extending into the interhemispheric fissure anteriorly.  No mass effect or midline shift.  IMPRESSION:  1.  Small right-sided subdural hematoma extending into the anterior interhemispheric fissure.  No mass effect or midline shift. 2.  Right supraorbital and frontal scalp hematoma without underlying skull fracture.  CT MAXILLOFACIAL  Findings:  No acute facial bone fractures.  The supraorbital and right frontal scalp hematoma without underlying fractures.  The right globe is mildly deformed and there are dense calcifications. The paranasal sinuses are grossly clear.  There is chronic left maxillary sinus disease.  The mandibular condyles are normally located.  No mandible fracture.  No nasal bone fractures.  IMPRESSION:  No acute facial bone fractures.  CT CERVICAL SPINE  Findings:    Severe degenerative cervical spondylosis with multilevel disc disease and facet disease.  There are multilevel auto fusions. The C1-2 pannus is noted with mild impression on the upper cervical cord.  The overall alignment is grossly normal.  No obvious acute fracture or abnormal prevertebral soft tissue swelling.  The lung apices  are grossly clear.  IMPRESSION:  1.  Severe degenerative cervical spondylosis with severe multilevel disc disease and facet disease. 2.  No acute fracture.   Original Report Authenticated By: Rudie Meyer, M.D.   Dg Hand Complete Left  08/16/2012   *RADIOLOGY REPORT*  Clinical Data: Anterior left hand pain, laceration and bruising following a fall today.  LEFT HAND - COMPLETE 3+ VIEW  Comparison: Left wrist dated 02/15/2009.  Findings: Old, healed ulnar styloid fracture.  No acute fracture or dislocation. Calcification adjacent to the radial aspect of the second metacarpal head.  IMPRESSION: No fracture.   Original Report Authenticated By: Beckie Salts, M.D.   Ct Maxillofacial Wo Cm  08/16/2012   *RADIOLOGY REPORT*  Clinical Data:  FELL.  HIT HEAD.  CT HEAD WITHOUT CONTRAST CT MAXILLOFACIAL WITHOUT CONTRAST CT CERVICAL SPINE WITHOUT CONTRAST  Technique:  Multidetector CT imaging of the head, cervical spine, and maxillofacial structures were performed using the standard protocol without intravenous contrast. Multiplanar CT image reconstructions of the cervical spine and maxillofacial structures were also generated.  Comparison:   None  CT HEAD  Findings: There is a supraorbital and right frontal scalp hematoma without underlying skull fracture.  The orbital bones appear intact.  The globes are intact.  There is dense calcification and deformity involving the right globe.  Mild age related cerebral atrophy, ventriculomegaly and periventricular white matter disease.  There is a small right-sided subdural hematoma extending into the interhemispheric fissure anteriorly.  No mass effect or  midline shift.  IMPRESSION:  1.  Small right-sided subdural hematoma extending into the anterior interhemispheric fissure.  No mass effect or midline shift. 2.  Right supraorbital and frontal scalp hematoma without underlying skull fracture.  CT MAXILLOFACIAL  Findings:  No acute facial bone fractures.  The supraorbital and right frontal scalp hematoma without underlying fractures.  The right globe is mildly deformed and there are dense calcifications. The paranasal sinuses are grossly clear.  There is chronic left maxillary sinus disease.  The mandibular condyles are normally located.  No mandible fracture.  No nasal bone fractures.  IMPRESSION:  No acute facial bone fractures.  CT CERVICAL SPINE  Findings:   Severe degenerative cervical spondylosis with multilevel disc disease and facet disease.  There are multilevel auto fusions. The C1-2 pannus is noted with mild impression on the upper cervical cord.  The overall alignment is grossly normal.  No obvious acute fracture or abnormal prevertebral soft tissue swelling.  The lung apices are grossly clear.  IMPRESSION:  1.  Severe degenerative cervical spondylosis with severe multilevel disc disease and facet disease. 2.  No acute fracture.   Original Report Authenticated By: Rudie Meyer, M.D.   Orders placed in visit on 04/08/12  . EKG 12-LEAD    Assessment/Plan Principal Problem:   Subdural hematoma  - small SDH noted on admission CT head after being brought to ED for suffering mechanical fall. NSG consulted who rec'd no urgent intervention but to admit to obs overnight for monitoring (per report from PA in ED). Pt will be admitted to observation under our care. We will keep him on tele overnight and reassess stability in the morning. Holding ASA 81 daily as rec'd by NSG. No changes to his home medications given stable vitals in ED, so all home meds will be continued. If stable, consider d/c home in AM  [facility], Brihana Quickel 08/16/2012, 1:36 PM

## 2012-08-16 NOTE — ED Provider Notes (Signed)
Medical screening examination/treatment/procedure(s) were conducted as a shared visit with non-physician practitioner(s) and myself.  I personally evaluated the patient during the encounter. I saw and evaluated the patient, reviewed the resident's note and I agree with the findings and plan.   79yo M, c/o sudden onset and resolution of one episode of slip and fall PTA. Pt states he was out for his usual daily "45 minute walk" when he tripped and fell on the sidewalk.  Pt states he landed on his face.  Denies LOC, no AMS since fall, no neck pain, no prodromal symptoms before fall.  A&O, resps easy, abd soft/NT, neuro exam intact, gait steady, +lac to right forehead and inside upper lip. +right periorbital ecchymosis.  CT-H with +right sided SDH.  Will admit for observation.    Laray Anger, DO 08/16/12 1312

## 2012-08-16 NOTE — Consult Note (Signed)
Reason for Consult: Subdural hematoma status post fall Referring Physician: Dr. Ardeen Jourdain Armitage is an 79 y.o. male.  HPI: Patient is an 79 year old right-handed male who sustained a fall [place]. He states he tripped. He struck the right frontal region of his head. Patient was brought to the [facility] emergency room where a CT scan of the head demonstrated a presence of a small thin subdural hematoma on the right. There is also some interhemispheric blood noted along the falx. No mass effect or shift is noted on the scan.  The patient himself denies any loss of consciousness. Currently denies any significant headache other than the soreness on the his head and face from his contusions and lacerations.  Past Medical History  Diagnosis Date  . GERD (gastroesophageal reflux disease)   . Glaucoma   . Hyperlipidemia   . Hypertension   . BPH (benign prostatic hyperplasia)   . CAD (coronary artery disease)     RCA occluded (Cath [place] 1980s);  LHC 12/22/10: dLM 30%, prox to mid LAD 30%, oOM 95%, AV branches supplied collats to PDA, RCA occluded, EF 65%.  He underwent placement of a Promus DES to the OM.  Marland Kitchen Carotid stenosis     0 - 39% bilateral  . Gout   . Diverticulosis   . Anginal pain 12/2010  . Pneumonia 1950's  . Exertional dyspnea 10/30/2011  . Acute GI bleeding 10/30/2011  . 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 Colonoscopy    . Cystectomy      right cheek  . Transurethral resection of prostate  ~ 2007  . Eye muscle surgery  1953    right  . Coronary angioplasty with stent placement  12/2010    "1"    Family History  Problem Relation Age of Onset  . Colon cancer Neg Hx   . Prostate cancer Father 78  . Cancer Mother     Stomach    Social History:  reports that he quit smoking about 45 years ago. His smoking use included Cigarettes. He has a 42 pack-year  smoking history. He has never used smokeless tobacco. He reports that  drinks alcohol. He reports that he does not use illicit drugs.  Allergies: No Known Allergies  Medications: I have reviewed the patient's current medications.  Results for orders placed during the hospital encounter of 08/16/12 (from the past 48 hour(s))  CBC WITH DIFFERENTIAL     Status: None   Collection Time    08/16/12  9:58 AM      Result Value Range   WBC 7.3  4.0 - 10.5 K/uL   RBC 4.38  4.22 - 5.81 MIL/uL   Hemoglobin 13.7  13.0 - 17.0 g/dL   HCT 16.1  09.6 - 04.5 %   MCV 93.2  78.0 - 100.0 fL   MCH 31.3  26.0 - 34.0 pg   MCHC 33.6  30.0 - 36.0 g/dL   RDW 40.9  81.1 - 91.4 %   Platelets 170  150 - 400 K/uL   Neutrophils Relative % 73  43 - 77 %   Neutro Abs 5.3  1.7 - 7.7 K/uL   Lymphocytes Relative 14  12 - 46 %   Lymphs Abs 1.0  0.7 - 4.0 K/uL   Monocytes Relative 10  3 - 12 %   Monocytes Absolute  0.8  0.1 - 1.0 K/uL   Eosinophils Relative 2  0 - 5 %   Eosinophils Absolute 0.2  0.0 - 0.7 K/uL   Basophils Relative 0  0 - 1 %   Basophils Absolute 0.0  0.0 - 0.1 K/uL  COMPREHENSIVE METABOLIC PANEL     Status: Abnormal   Collection Time    08/16/12  9:58 AM      Result Value Range   Sodium 136  135 - 145 mEq/L   Potassium 4.1  3.5 - 5.1 mEq/L   Chloride 99  96 - 112 mEq/L   CO2 28  19 - 32 mEq/L   Glucose, Bld 111 (*) 70 - 99 mg/dL   BUN 13  6 - 23 mg/dL   Creatinine, Ser 4.78  0.50 - 1.35 mg/dL   Calcium 9.3  8.4 - 29.5 mg/dL   Total Protein 6.5  6.0 - 8.3 g/dL   Albumin 3.7  3.5 - 5.2 g/dL   AST 21  0 - 37 U/L   ALT 10  0 - 53 U/L   Alkaline Phosphatase 90  39 - 117 U/L   Total Bilirubin 0.5  0.3 - 1.2 mg/dL   GFR calc non Af Amer 77 (*) >90 mL/min   GFR calc Af Amer 90 (*) >90 mL/min   Comment:            The eGFR has been calculated     using the CKD EPI equation.     This calculation has not been     validated in all clinical     situations.     eGFR's persistently     <90 mL/min  signify     possible Chronic Kidney Disease.  PROTIME-INR     Status: None   Collection Time    08/16/12  9:58 AM      Result Value Range   Prothrombin Time 12.5  11.6 - 15.2 seconds   INR 0.94  0.00 - 1.49    Ct Head Wo Contrast  08/16/2012   *RADIOLOGY REPORT*  Clinical Data:  FELL.  HIT HEAD.  CT HEAD WITHOUT CONTRAST CT MAXILLOFACIAL WITHOUT CONTRAST CT CERVICAL SPINE WITHOUT CONTRAST  Technique:  Multidetector CT imaging of the head, cervical spine, and maxillofacial structures were performed using the standard protocol without intravenous contrast. Multiplanar CT image reconstructions of the cervical spine and maxillofacial structures were also generated.  Comparison:   None  CT HEAD  Findings: There is a supraorbital and right frontal scalp hematoma without underlying skull fracture.  The orbital bones appear intact.  The globes are intact.  There is dense calcification and deformity involving the right globe.  Mild age related cerebral atrophy, ventriculomegaly and periventricular white matter disease.  There is a small right-sided subdural hematoma extending into the interhemispheric fissure anteriorly.  No mass effect or midline shift.  IMPRESSION:  1.  Small right-sided subdural hematoma extending into the anterior interhemispheric fissure.  No mass effect or midline shift. 2.  Right supraorbital and frontal scalp hematoma without underlying skull fracture.  CT MAXILLOFACIAL  Findings:  No acute facial bone fractures.  The supraorbital and right frontal scalp hematoma without underlying fractures.  The right globe is mildly deformed and there are dense calcifications. The paranasal sinuses are grossly clear.  There is chronic left maxillary sinus disease.  The mandibular condyles are normally located.  No mandible fracture.  No nasal bone fractures.  IMPRESSION:  No acute facial bone fractures.  CT CERVICAL SPINE  Findings:   Severe degenerative cervical spondylosis with multilevel disc disease  and facet disease.  There are multilevel auto fusions. The C1-2 pannus is noted with mild impression on the upper cervical cord.  The overall alignment is grossly normal.  No obvious acute fracture or abnormal prevertebral soft tissue swelling.  The lung apices are grossly clear.  IMPRESSION:  1.  Severe degenerative cervical spondylosis with severe multilevel disc disease and facet disease. 2.  No acute fracture.   Original Report Authenticated By: Rudie Meyer, M.D.   Ct Cervical Spine Wo Contrast  08/16/2012   *RADIOLOGY REPORT*  Clinical Data:  FELL.  HIT HEAD.  CT HEAD WITHOUT CONTRAST CT MAXILLOFACIAL WITHOUT CONTRAST CT CERVICAL SPINE WITHOUT CONTRAST  Technique:  Multidetector CT imaging of the head, cervical spine, and maxillofacial structures were performed using the standard protocol without intravenous contrast. Multiplanar CT image reconstructions of the cervical spine and maxillofacial structures were also generated.  Comparison:   None  CT HEAD  Findings: There is a supraorbital and right frontal scalp hematoma without underlying skull fracture.  The orbital bones appear intact.  The globes are intact.  There is dense calcification and deformity involving the right globe.  Mild age related cerebral atrophy, ventriculomegaly and periventricular white matter disease.  There is a small right-sided subdural hematoma extending into the interhemispheric fissure anteriorly.  No mass effect or midline shift.  IMPRESSION:  1.  Small right-sided subdural hematoma extending into the anterior interhemispheric fissure.  No mass effect or midline shift. 2.  Right supraorbital and frontal scalp hematoma without underlying skull fracture.  CT MAXILLOFACIAL  Findings:  No acute facial bone fractures.  The supraorbital and right frontal scalp hematoma without underlying fractures.  The right globe is mildly deformed and there are dense calcifications. The paranasal sinuses are grossly clear.  There is chronic left  maxillary sinus disease.  The mandibular condyles are normally located.  No mandible fracture.  No nasal bone fractures.  IMPRESSION:  No acute facial bone fractures.  CT CERVICAL SPINE  Findings:   Severe degenerative cervical spondylosis with multilevel disc disease and facet disease.  There are multilevel auto fusions. The C1-2 pannus is noted with mild impression on the upper cervical cord.  The overall alignment is grossly normal.  No obvious acute fracture or abnormal prevertebral soft tissue swelling.  The lung apices are grossly clear.  IMPRESSION:  1.  Severe degenerative cervical spondylosis with severe multilevel disc disease and facet disease. 2.  No acute fracture.   Original Report Authenticated By: Rudie Meyer, M.D.   Dg Hand Complete Left  08/16/2012   *RADIOLOGY REPORT*  Clinical Data: Anterior left hand pain, laceration and bruising following a fall today.  LEFT HAND - COMPLETE 3+ VIEW  Comparison: Left wrist dated 02/15/2009.  Findings: Old, healed ulnar styloid fracture.  No acute fracture or dislocation. Calcification adjacent to the radial aspect of the second metacarpal head.  IMPRESSION: No fracture.   Original Report Authenticated By: Beckie Salts, M.D.   Ct Maxillofacial Wo Cm  08/16/2012   *RADIOLOGY REPORT*  Clinical Data:  FELL.  HIT HEAD.  CT HEAD WITHOUT CONTRAST CT MAXILLOFACIAL WITHOUT CONTRAST CT CERVICAL SPINE WITHOUT CONTRAST  Technique:  Multidetector CT imaging of the head, cervical spine, and maxillofacial structures were performed using the standard protocol without intravenous contrast. Multiplanar CT image reconstructions of the cervical spine and maxillofacial structures were also generated.  Comparison:   None  CT HEAD  Findings: There is a supraorbital and right frontal scalp hematoma without underlying skull fracture.  The orbital bones appear intact.  The globes are intact.  There is dense calcification and deformity involving the right globe.  Mild age related  cerebral atrophy, ventriculomegaly and periventricular white matter disease.  There is a small right-sided subdural hematoma extending into the interhemispheric fissure anteriorly.  No mass effect or midline shift.  IMPRESSION:  1.  Small right-sided subdural hematoma extending into the anterior interhemispheric fissure.  No mass effect or midline shift. 2.  Right supraorbital and frontal scalp hematoma without underlying skull fracture.  CT MAXILLOFACIAL  Findings:  No acute facial bone fractures.  The supraorbital and right frontal scalp hematoma without underlying fractures.  The right globe is mildly deformed and there are dense calcifications. The paranasal sinuses are grossly clear.  There is chronic left maxillary sinus disease.  The mandibular condyles are normally located.  No mandible fracture.  No nasal bone fractures.  IMPRESSION:  No acute facial bone fractures.  CT CERVICAL SPINE  Findings:   Severe degenerative cervical spondylosis with multilevel disc disease and facet disease.  There are multilevel auto fusions. The C1-2 pannus is noted with mild impression on the upper cervical cord.  The overall alignment is grossly normal.  No obvious acute fracture or abnormal prevertebral soft tissue swelling.  The lung apices are grossly clear.  IMPRESSION:  1.  Severe degenerative cervical spondylosis with severe multilevel disc disease and facet disease. 2.  No acute fracture.   Original Report Authenticated By: Rudie Meyer, M.D.    Review of Systems  Constitutional: Negative.   HENT: Negative.   Eyes:       Missing right eye since childhood  Respiratory: Negative.   Cardiovascular: Negative.   Gastrointestinal: Negative.   Genitourinary: Negative.   Musculoskeletal: Negative.   Skin: Negative.   Neurological: Negative.   Endo/Heme/Allergies: Negative.   Psychiatric/Behavioral: Negative.    Blood pressure 134/73, pulse 63, temperature 97.8 F (36.6 C), temperature source Oral, resp. rate  16, SpO2 97.00%. Physical Exam  Constitutional: He is oriented to person, place, and time. He appears well-developed and well-nourished.  HENT:  Periorbital ecchymoses about right I. A couple lacerations and contusions about malar area on her right in addition to significant contusion laceration of upper lip on right side  Neck: Normal range of motion. Neck supple.  Cardiovascular: Regular rhythm.   Respiratory: Effort normal and breath sounds normal.  GI: Soft. Bowel sounds are normal.  Musculoskeletal:  Stiff right shoulder secondary to significant intrinsic shoulder pathology upper extremity moves well on right  Neurological: He is alert and oriented to person, place, and time. He has normal reflexes.  Skin: Skin is warm and dry.  Psychiatric: He has a normal mood and affect. His behavior is normal. Judgment and thought content normal.    Assessment/Plan: Have advised the patient that this process will likely resolve itself and should not require any further specific followup. Advise several days of restricted activity and no driving for approximately 2 weeks time until his contusions heal well but also advised that he refrain from using aspirin for the next couple of weeks. If headaches persist or worsen or if there is any change in his neurologic status which I discussed with his wife to include significant lethargy or changes in speech then I would advise repeat CT scan but otherwise I believe that this process will resolve itself. Will remain available for reconsultation should the need  ariseStefani Dama 08/16/2012, 2:40 PM

## 2012-08-16 NOTE — ED Provider Notes (Signed)
LACERATION REPAIR Performed by: Tana Conch Authorized by: Tana Conch Consent: Verbal consent obtained. Risks and benefits: risks, benefits and alternatives were discussed Consent given by: patient Patient identity confirmed: provided demographic data Prepped and Draped in normal sterile fashion Wound explored  Laceration Location: upper lip sparing vermillion border  Laceration Length: 2cm  No Foreign Bodies seen or palpated  Anesthesia: local infiltration  Local anesthetic: lidocaine 2% without epinephrine  Anesthetic total: 4 ml  Irrigation method: syringe Amount of cleaning: standard  Skin closure: Vicryl absorbable  Number of sutures: 3  Technique: simple interrupted   Patient tolerance: Patient tolerated the procedure well with no immediate complications.   Shelva Majestic, MD 08/16/12 1210

## 2012-08-16 NOTE — ED Notes (Signed)
[name] Attempted report

## 2012-08-17 LAB — CBC
HCT: 36.7 % — ABNORMAL LOW (ref 39.0–52.0)
Hemoglobin: 12.5 g/dL — ABNORMAL LOW (ref 13.0–17.0)
MCH: 31.6 pg (ref 26.0–34.0)
MCHC: 34.1 g/dL (ref 30.0–36.0)
RDW: 13.4 % (ref 11.5–15.5)

## 2012-08-17 LAB — BASIC METABOLIC PANEL
BUN: 13 mg/dL (ref 6–23)
Chloride: 99 mEq/L (ref 96–112)
Creatinine, Ser: 0.92 mg/dL (ref 0.50–1.35)
GFR calc non Af Amer: 78 mL/min — ABNORMAL LOW (ref >90)
Glucose, Bld: 113 mg/dL — ABNORMAL HIGH (ref 70–99)
Potassium: 3.5 mEq/L (ref 3.5–5.1)

## 2012-08-17 NOTE — Progress Notes (Signed)
UR COMPLETED  

## 2012-08-17 NOTE — Discharge Summary (Signed)
DISCHARGE SUMMARY  [name]  MR#: [id number]  DOB:1930-08-20  Date of Admission: 08/16/2012 Date of Discharge: 08/17/2012  Attending Physician:Coda Filler W  Patient's WUJ:WJXBJYN,WGNFAOZ W, MD  Consults:  Danielle Dess, Neurosurgery.  Discharge Diagnoses: Principal Problem:   Subdural hematoma  Past Medical History   Diagnosis  Date   .  GERD (gastroesophageal reflux disease)    .  Glaucoma    .  Hyperlipidemia    .  Hypertension    .  BPH (benign prostatic hyperplasia)    .  CAD (coronary artery disease)      RCA occluded (Cath [place] 1980s); LHC 12/22/10: dLM 30%, prox to mid LAD 30%, oOM 95%, AV branches supplied collats to PDA, RCA occluded, EF 65%. He underwent placement of a Promus DES to the OM.   Marland Kitchen  Carotid stenosis      0 - 39% bilateral   .  Gout    .  Diverticulosis    .  Anginal pain  12/2010   .  Pneumonia  1950's   .  Exertional dyspnea  10/30/2011   .  Acute GI bleeding  10/30/2011    Mobitz type 1 second degree heart block, asymptomatic  Discharge Medications:   Medication List    STOP taking these medications       aspirin EC 81 MG tablet      TAKE these medications       allopurinol 300 MG tablet  Commonly known as:  ZYLOPRIM  Take 300 mg by mouth Daily.     brimonidine 0.1 % Soln  Commonly known as:  ALPHAGAN P  Place 1 drop into the left eye 2 (two) times daily.     docusate sodium 100 MG capsule  Commonly known as:  COLACE  Take 100 mg by mouth daily as needed. For constipation     finasteride 5 MG tablet  Commonly known as:  PROSCAR  Take 5 mg by mouth Daily.     fluticasone 50 MCG/ACT nasal spray  Commonly known as:  FLONASE  Place 2 sprays into the nose as needed for allergies. 2 sprays twice daily each side     HEMORRHOID RE  Place 1 application rectally as needed (hemorrhoids). A ointment     hydrochlorothiazide 25 MG tablet  Commonly known as:  HYDRODIURIL  Take 12.5-25 mg by mouth daily.     hydrocortisone cream 1 %   Apply 1 application topically as needed (itching).     ibuprofen 200 MG tablet  Commonly known as:  ADVIL,MOTRIN  Take 200 mg by mouth every 6 (six) hours as needed for pain.     lisinopril 40 MG tablet  Commonly known as:  PRINIVIL,ZESTRIL  Take 1 tablet by mouth Daily.     metoprolol tartrate 25 MG tablet  Commonly known as:  LOPRESSOR  Take 1 tablet (25 mg total) by mouth 2 (two) times daily.     nitroGLYCERIN 0.4 MG SL tablet  Commonly known as:  NITROSTAT  Place 0.4 mg under the tongue every 5 (five) minutes as needed.     ranitidine 150 MG tablet  Commonly known as:  ZANTAC  Take 150 mg by mouth daily as needed. For heartburn     simvastatin 80 MG tablet  Commonly known as:  ZOCOR  Take 80 mg by mouth at bedtime.     tamsulosin 0.4 MG Caps  Commonly known as:  FLOMAX  Take 0.4 mg by mouth Daily.     TRAVATAN  Z 0.004 % Soln ophthalmic solution  Generic drug:  Travoprost (BAK Free)  Place 1 drop into the left eye at bedtime.        Hospital Procedures: Ct Head Wo Contrast  08/16/2012   *RADIOLOGY REPORT*  Clinical Data:  FELL.  HIT HEAD.  CT HEAD WITHOUT CONTRAST CT MAXILLOFACIAL WITHOUT CONTRAST CT CERVICAL SPINE WITHOUT CONTRAST  Technique:  Multidetector CT imaging of the head, cervical spine, and maxillofacial structures were performed using the standard protocol without intravenous contrast. Multiplanar CT image reconstructions of the cervical spine and maxillofacial structures were also generated.  Comparison:   None  CT HEAD  Findings: There is a supraorbital and right frontal scalp hematoma without underlying skull fracture.  The orbital bones appear intact.  The globes are intact.  There is dense calcification and deformity involving the right globe.  Mild age related cerebral atrophy, ventriculomegaly and periventricular white matter disease.  There is a small right-sided subdural hematoma extending into the interhemispheric fissure anteriorly.  No mass effect  or midline shift.  IMPRESSION:  1.  Small right-sided subdural hematoma extending into the anterior interhemispheric fissure.  No mass effect or midline shift. 2.  Right supraorbital and frontal scalp hematoma without underlying skull fracture.  CT MAXILLOFACIAL  Findings:  No acute facial bone fractures.  The supraorbital and right frontal scalp hematoma without underlying fractures.  The right globe is mildly deformed and there are dense calcifications. The paranasal sinuses are grossly clear.  There is chronic left maxillary sinus disease.  The mandibular condyles are normally located.  No mandible fracture.  No nasal bone fractures.  IMPRESSION:  No acute facial bone fractures.  CT CERVICAL SPINE  Findings:   Severe degenerative cervical spondylosis with multilevel disc disease and facet disease.  There are multilevel auto fusions. The C1-2 pannus is noted with mild impression on the upper cervical cord.  The overall alignment is grossly normal.  No obvious acute fracture or abnormal prevertebral soft tissue swelling.  The lung apices are grossly clear.  IMPRESSION:  1.  Severe degenerative cervical spondylosis with severe multilevel disc disease and facet disease. 2.  No acute fracture.   Original Report Authenticated By: Rudie Meyer, M.D.   Ct Cervical Spine Wo Contrast  08/16/2012   *RADIOLOGY REPORT*  Clinical Data:  FELL.  HIT HEAD.  CT HEAD WITHOUT CONTRAST CT MAXILLOFACIAL WITHOUT CONTRAST CT CERVICAL SPINE WITHOUT CONTRAST  Technique:  Multidetector CT imaging of the head, cervical spine, and maxillofacial structures were performed using the standard protocol without intravenous contrast. Multiplanar CT image reconstructions of the cervical spine and maxillofacial structures were also generated.  Comparison:   None  CT HEAD  Findings: There is a supraorbital and right frontal scalp hematoma without underlying skull fracture.  The orbital bones appear intact.  The globes are intact.  There is dense  calcification and deformity involving the right globe.  Mild age related cerebral atrophy, ventriculomegaly and periventricular white matter disease.  There is a small right-sided subdural hematoma extending into the interhemispheric fissure anteriorly.  No mass effect or midline shift.  IMPRESSION:  1.  Small right-sided subdural hematoma extending into the anterior interhemispheric fissure.  No mass effect or midline shift. 2.  Right supraorbital and frontal scalp hematoma without underlying skull fracture.  CT MAXILLOFACIAL  Findings:  No acute facial bone fractures.  The supraorbital and right frontal scalp hematoma without underlying fractures.  The right globe is mildly deformed and there are dense calcifications. The  paranasal sinuses are grossly clear.  There is chronic left maxillary sinus disease.  The mandibular condyles are normally located.  No mandible fracture.  No nasal bone fractures.  IMPRESSION:  No acute facial bone fractures.  CT CERVICAL SPINE  Findings:   Severe degenerative cervical spondylosis with multilevel disc disease and facet disease.  There are multilevel auto fusions. The C1-2 pannus is noted with mild impression on the upper cervical cord.  The overall alignment is grossly normal.  No obvious acute fracture or abnormal prevertebral soft tissue swelling.  The lung apices are grossly clear.  IMPRESSION:  1.  Severe degenerative cervical spondylosis with severe multilevel disc disease and facet disease. 2.  No acute fracture.   Original Report Authenticated By: Rudie Meyer, M.D.   Dg Hand Complete Left  08/16/2012   *RADIOLOGY REPORT*  Clinical Data: Anterior left hand pain, laceration and bruising following a fall today.  LEFT HAND - COMPLETE 3+ VIEW  Comparison: Left wrist dated 02/15/2009.  Findings: Old, healed ulnar styloid fracture.  No acute fracture or dislocation. Calcification adjacent to the radial aspect of the second metacarpal head.  IMPRESSION: No fracture.    Original Report Authenticated By: Beckie Salts, M.D.   Ct Maxillofacial Wo Cm  08/16/2012   *RADIOLOGY REPORT*  Clinical Data:  FELL.  HIT HEAD.  CT HEAD WITHOUT CONTRAST CT MAXILLOFACIAL WITHOUT CONTRAST CT CERVICAL SPINE WITHOUT CONTRAST  Technique:  Multidetector CT imaging of the head, cervical spine, and maxillofacial structures were performed using the standard protocol without intravenous contrast. Multiplanar CT image reconstructions of the cervical spine and maxillofacial structures were also generated.  Comparison:   None  CT HEAD  Findings: There is a supraorbital and right frontal scalp hematoma without underlying skull fracture.  The orbital bones appear intact.  The globes are intact.  There is dense calcification and deformity involving the right globe.  Mild age related cerebral atrophy, ventriculomegaly and periventricular white matter disease.  There is a small right-sided subdural hematoma extending into the interhemispheric fissure anteriorly.  No mass effect or midline shift.  IMPRESSION:  1.  Small right-sided subdural hematoma extending into the anterior interhemispheric fissure.  No mass effect or midline shift. 2.  Right supraorbital and frontal scalp hematoma without underlying skull fracture.  CT MAXILLOFACIAL  Findings:  No acute facial bone fractures.  The supraorbital and right frontal scalp hematoma without underlying fractures.  The right globe is mildly deformed and there are dense calcifications. The paranasal sinuses are grossly clear.  There is chronic left maxillary sinus disease.  The mandibular condyles are normally located.  No mandible fracture.  No nasal bone fractures.  IMPRESSION:  No acute facial bone fractures.  CT CERVICAL SPINE  Findings:   Severe degenerative cervical spondylosis with multilevel disc disease and facet disease.  There are multilevel auto fusions. The C1-2 pannus is noted with mild impression on the upper cervical cord.  The overall alignment is  grossly normal.  No obvious acute fracture or abnormal prevertebral soft tissue swelling.  The lung apices are grossly clear.  IMPRESSION:  1.  Severe degenerative cervical spondylosis with severe multilevel disc disease and facet disease. 2.  No acute fracture.   Original Report Authenticated By: Rudie Meyer, M.D.    History of Present Illness: Patient is an 79 year old male who fell at [facility] and hit his head.  No LOC, but brought to the ER and trauma workup revealed small subdural hematoma.  Hospital Course: Rhetta Mura  was admitted for observation.  Ice for bruising, taken off aspirin and seen in consultation by Neurosurgery who believed the subdural to be small enough that it did not need to be followed and restrictions and precautions discussed with the patient as below.  He had an uneventful night, no changes in LOC, or new HA.  He was noted as having a second degree heart block, but completely asymptomatic.  Day of Discharge Exam BP 115/54  Pulse 62  Temp(Src) 98.1 F (36.7 C) (Oral)  Resp 20  Ht 5\' 7"  (1.702 m)  Wt 78.019 kg (172 lb)  BMI 26.93 kg/m2  SpO2 95%  Physical Exam: Gen: No acute distress, s/p trauma to head HEENT: large ecchymosis around the R eye. R eye swollen shut. L eye PERRL, EOMI . Uvula and tongue midline. MMM.  Lungs:CTAB, no wheezes or rales  Cardio: RRR, no MRG, no peripheral edema, 2+ radial and DP pulses  Abd: nontender, nondistended, normal BS, no HSM  MSK: 5/5 strength of UE/LE. Normal reflexes  Neuro: CN II-XII grossly intact. No focal deficits in strength or reflexes  Skin: ecchymosis on face    Discharge Labs:  Recent Labs  08/16/12 0958 08/17/12 0457  NA 136 134*  K 4.1 3.5  CL 99 99  CO2 28 28  GLUCOSE 111* 113*  BUN 13 13  CREATININE 0.93 0.92  CALCIUM 9.3 9.0    Recent Labs  08/16/12 0958  AST 21  ALT 10  ALKPHOS 90  BILITOT 0.5  PROT 6.5  ALBUMIN 3.7    Recent Labs  08/16/12 0958 08/17/12 0457  WBC 7.3 8.4   NEUTROABS 5.3  --   HGB 13.7 12.5*  HCT 40.8 36.7*  MCV 93.2 92.7  PLT 170 152   Lab Results  Component Value Date   INR 0.94 08/16/2012   INR 1.08 10/30/2011   INR 1.0 12/17/2010   Discharge instructions: Have advised the patient that this process will likely resolve itself and should not require any further specific followup. Advise several days of restricted activity and no driving for approximately 2 weeks time until his contusions heal well but also advised that he refrain from using aspirin for the next couple of weeks. If headaches persist or worsen or if there is any change in his neurologic status which I discussed with his wife to include significant lethargy or changes in speech then I would advise repeat CT scan but otherwise   Disposition: Home  Follow-up Appts: Follow-up with Dr. Wylene Simmer at [facility] in 1 week.  My nurse will call tomorrow to set up for appointment.  Condition on Discharge: Stable  Signed: Valeda Corzine W 08/17/2012, 7:57 AM

## 2012-08-17 NOTE — ED Provider Notes (Signed)
I saw and evaluated the patient, reviewed the resident's note and I agree with the findings and plan. Please see my previous note.  Laray Anger, DO 08/17/12 1026

## 2012-08-17 NOTE — Progress Notes (Signed)
Pt 's tele monitor showed intermittent missed beats with 2nd degree AV Block type 2. Pt is asymptomatic. Denies any chest pain or discomfort. V/S are normal. STAT EKG was done and showed sinus rhythm with 2nd degree AV block (mobitz 1) with occasional PVC's. Dr. Link Snuffer was notified and no new orders given. Will continue to monitor.

## 2012-08-17 NOTE — Progress Notes (Signed)
AVS and discharge instruction given to pt pt verballized understanding . Discharged home ,condition stable

## 2012-08-17 NOTE — ED Provider Notes (Signed)
Medical screening examination/treatment/procedure(s) were conducted as a shared visit with non-physician practitioner(s) and myself.  I personally evaluated the patient during the encounter Please see my previous note.  Laray Anger, DO 08/17/12 1025

## 2012-08-17 NOTE — Progress Notes (Signed)
Patient ID: [name], male   DOB: September 03, 1930, 79 y.o.   MRN: [id number] Looks well, no drift. No complaints. For DC home

## 2012-08-17 NOTE — ED Provider Notes (Addendum)
I saw and evaluated the patient, reviewed the resident's note and I agree with the findings and plan. Please see my previous note.  Resident closed lacs x2. Right forehead lac closed with prolene; inner upper lip gaping lac closed with absorbable sutures.  Pt tol procedures well.     Laray Anger, DO 08/22/12 1423

## 2013-04-10 ENCOUNTER — Ambulatory Visit (INDEPENDENT_AMBULATORY_CARE_PROVIDER_SITE_OTHER): Payer: Medicare Other | Admitting: Cardiology

## 2013-04-10 ENCOUNTER — Encounter: Payer: Self-pay | Admitting: Cardiology

## 2013-04-10 VITALS — BP 150/82 | HR 45 | Ht 67.0 in | Wt 173.0 lb

## 2013-04-10 DIAGNOSIS — I2510 Atherosclerotic heart disease of native coronary artery without angina pectoris: Secondary | ICD-10-CM

## 2013-04-10 NOTE — Patient Instructions (Signed)
The current medical regimen is effective;  continue present plan and medications.  Follow up in 1 year with Dr Hochrein.  You will receive a letter in the mail 2 months before you are due.  Please call us when you receive this letter to schedule your follow up appointment.  

## 2013-04-10 NOTE — Progress Notes (Signed)
HPI The patient presents for evaluation of CAD.   In the 1980s he had an occluded right coronary artery with catheterization in [place]. He was managed medically.  He was seen in the office for chest pain and [facility]. LHC 12/22/10: dLM 30%, prox to mid LAD 30%, oOM 95%, AV branches supplied collats to PDA, RCA occluded, EF 65%. He underwent placement of a Promus DES to the OM.   Since last seen the patient has had a mechanical fall with a small subdural.  This was managed conservatively.  He walks daily.  The patient denies any new symptoms such as chest discomfort, neck or arm discomfort. There has been no new shortness of breath, PND or orthopnea. There have been no reported palpitations, presyncope or syncope.  No Known Allergies  Current Outpatient Prescriptions  Medication Sig Dispense Refill  . allopurinol (ZYLOPRIM) 300 MG tablet Take 300 mg by mouth Daily.       Marland Kitchen. aspirin 81 MG tablet Take 81 mg by mouth daily.      . brimonidine (ALPHAGAN P) 0.1 % SOLN Place 1 drop into the left eye 2 (two) times daily.      Marland Kitchen. docusate sodium (COLACE) 100 MG capsule Take 100 mg by mouth daily as needed. For constipation      . finasteride (PROSCAR) 5 MG tablet Take 5 mg by mouth Daily.       . fluticasone (FLONASE) 50 MCG/ACT nasal spray Place 2 sprays into the nose as needed for allergies. 2 sprays twice daily each side      . hydrochlorothiazide (HYDRODIURIL) 25 MG tablet Take 12.5-25 mg by mouth daily.      . hydrocortisone cream 1 % Apply 1 application topically as needed (itching).      Marland Kitchen. ibuprofen (ADVIL,MOTRIN) 200 MG tablet Take 200 mg by mouth every 6 (six) hours as needed for pain.      Marland Kitchen. lisinopril (PRINIVIL,ZESTRIL) 40 MG tablet Take 1 tablet by mouth Daily.       . metoprolol tartrate (LOPRESSOR) 25 MG tablet Take 1 tablet (25 mg total) by mouth 2 (two) times daily.  180 tablet  3  . nitroGLYCERIN (NITROSTAT) 0.4 MG SL tablet Place 0.4 mg under the tongue every 5 (five)  minutes as needed.        Marland Kitchen. PE-Shark Liver Oil-Cocoa Buttr (HEMORRHOID RE) Place 1 application rectally as needed (hemorrhoids). A ointment      . ranitidine (ZANTAC) 150 MG tablet Take 150 mg by mouth daily as needed. For heartburn      . simvastatin (ZOCOR) 80 MG tablet Take 80 mg by mouth at bedtime.      . Tamsulosin HCl (FLOMAX) 0.4 MG CAPS Take 0.4 mg by mouth Daily.       . TRAVATAN Z 0.004 % ophthalmic solution Place 1 drop into the left eye at bedtime.        No current facility-administered medications for this visit.    Past Medical History  Diagnosis Date  . GERD (gastroesophageal reflux disease)   . Glaucoma   . Hyperlipidemia   . Hypertension   . BPH (benign prostatic hyperplasia)   . CAD (coronary artery disease)     RCA occluded (Cath [place] 1980s);  LHC 12/22/10: dLM 30%, prox to mid LAD 30%, oOM 95%, AV branches supplied collats to PDA, RCA occluded, EF 65%.  He underwent placement of a Promus DES to the OM.  Marland Kitchen. Carotid stenosis  0 - 39% bilateral  . Gout   . Diverticulosis   . Anginal pain 12/2010  . Pneumonia 1950's  . Exertional dyspnea 10/30/2011  . Acute GI bleeding 10/30/2011  . 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 Colonoscopy    . Cystectomy      right cheek  . Transurethral resection of prostate  ~ 2007  . Eye muscle surgery  1953    right  . Coronary angioplasty with stent placement  12/2010    "1"   ROS:  As stated in the HPI and negative for all other systems.  PHYSICAL EXAM BP 150/82  Pulse 45  Ht 5\' 7"  (1.702 m)  Wt 173 lb (78.472 kg)  BMI 27.09 kg/m2 GENERAL:  Well appearing HEENT:  Right lens opacification, fundi not visualized, oral mucosa unremarkable NECK:  No jugular venous distention, waveform within normal limits, carotid upstroke brisk and symmetric, no bruits, no thyromegaly LUNGS:  Clear to auscultation bilaterally CHEST:  Unremarkable HEART:   PMI not displaced or sustained,S1 and S2 within normal limits, no S3, no S4, no clicks, no rubs, no murmurs ABD:  Flat, positive bowel sounds normal in frequency in pitch, no bruits, no rebound, no guarding, no midline pulsatile mass, no hepatomegaly, no splenomegaly EXT:  2 plus pulses throughout, no edema, no cyanosis no clubbing    EKG:  Sinus bradycardia, rate 45, voltage criteria for left ventricular hypertrophy, no acute ST-T wave changes with PACs.  04/10/2013   ASSESSMENT AND PLAN  CAD (coronary artery disease) -  The patient has no new sypmtoms.  No further cardiovascular testing is indicated.  We will continue with aggressive risk reduction and meds as listed.    HTN (hypertension) -  The blood pressure is slightly elevated today.  However, it is usually 130 systolic at home. No change in medications is indicated. We will continue with therapeutic lifestyle changes (TLC).  Carotid stenosis -  He had mild plaquing 2012.  I do not think that this needs to be repeated at this time.  No change in therapy is indicated.    Dyslipidemia -  I will try to get the results from Gaspar Garbe, MD

## 2013-07-21 ENCOUNTER — Other Ambulatory Visit: Payer: Self-pay

## 2013-07-21 MED ORDER — METOPROLOL TARTRATE 25 MG PO TABS: 25.0000 mg | ORAL_TABLET | Freq: Two times a day (BID) | ORAL | Status: DC

## 2013-09-05 IMAGING — CR DG HAND COMPLETE 3+V*L*
3 series · 3 of 3 positions shown · non-contrast
Comparison: Left wrist dated 02/15/2009.

CLINICAL DATA: Anterior left hand pain, laceration and bruising
following a fall today.

LEFT HAND - COMPLETE 3+ VIEW

[x hand pa left]
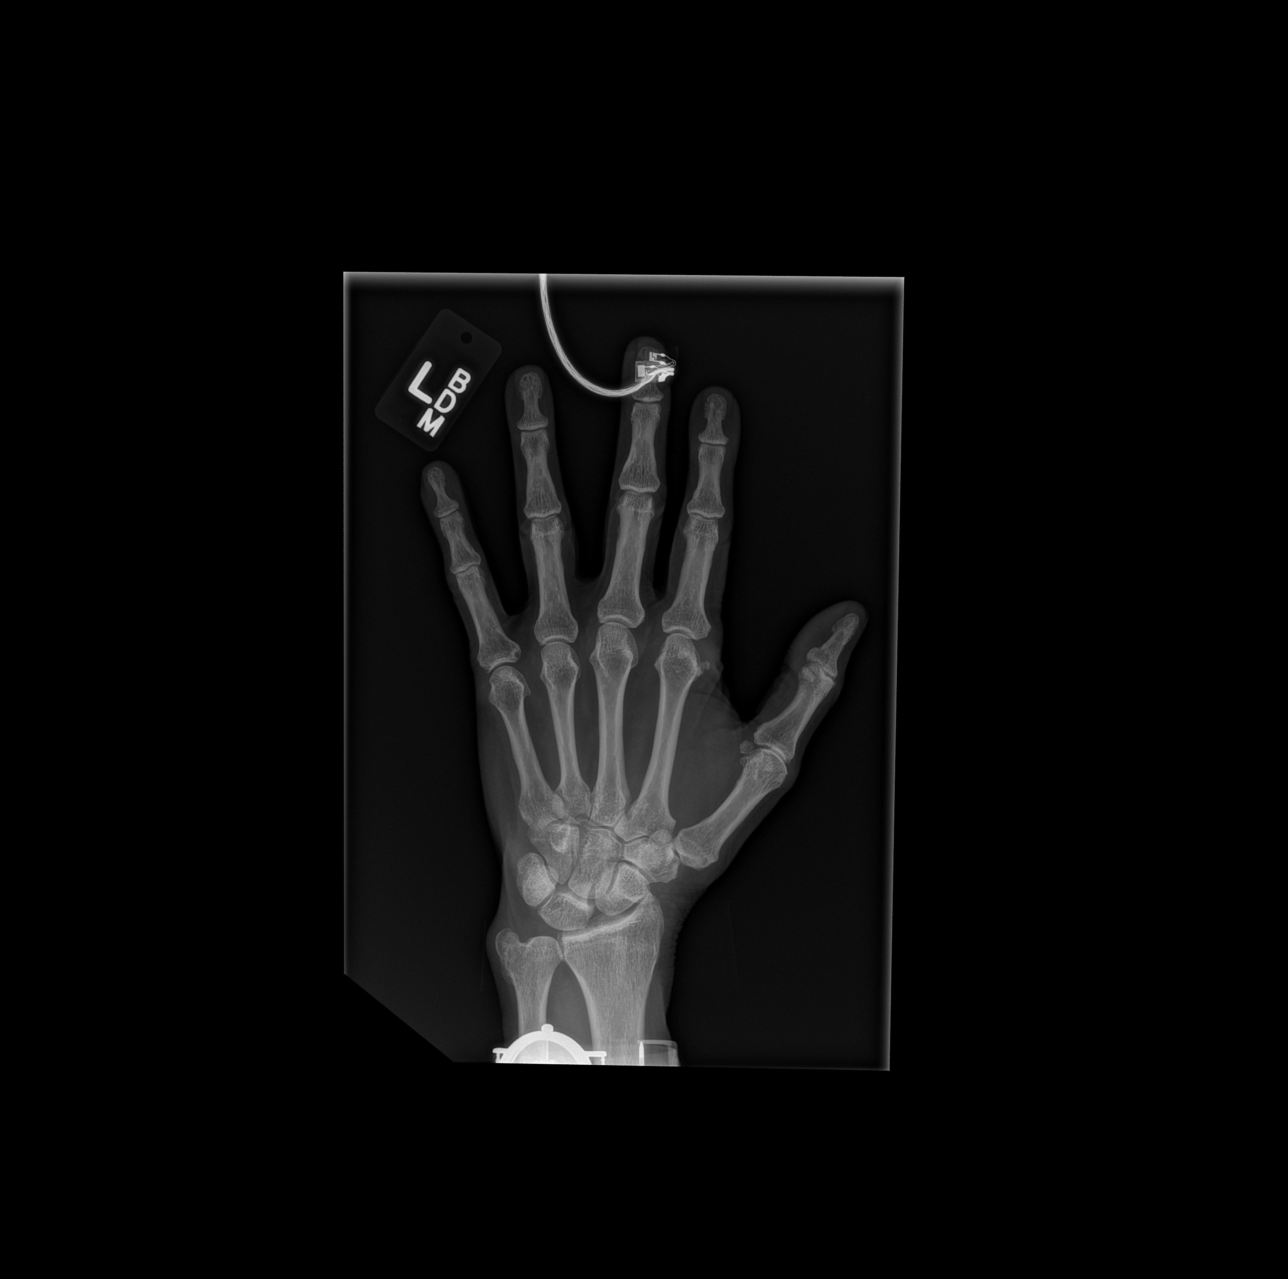

[x hand obl left]
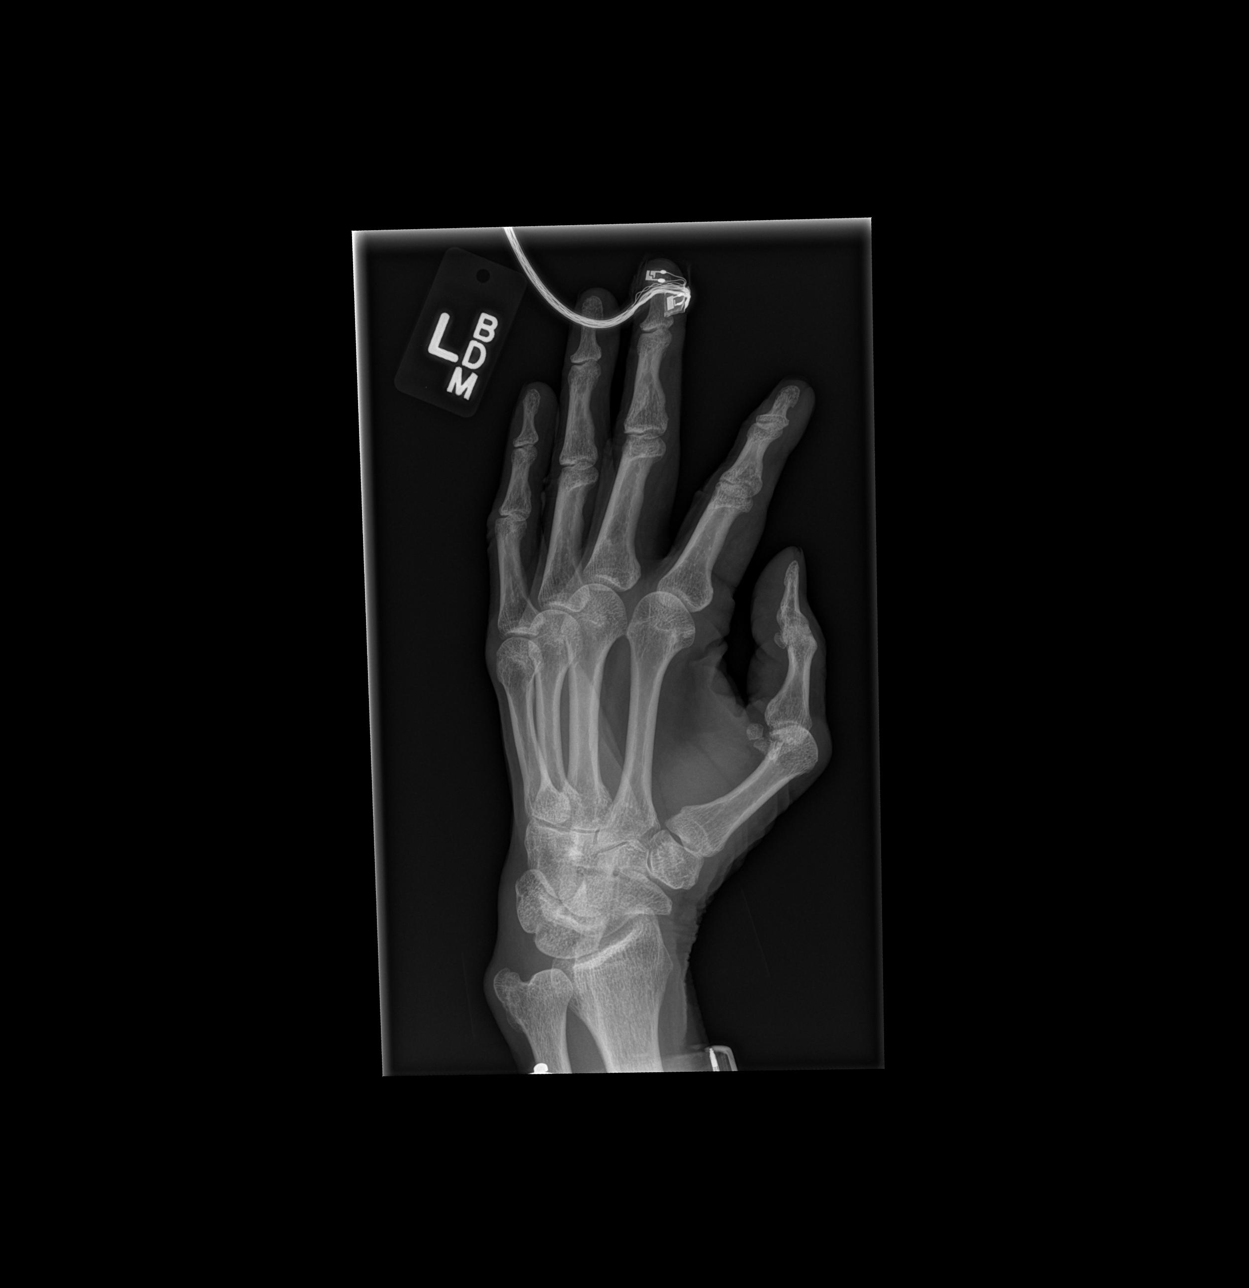

[x hand lat left]
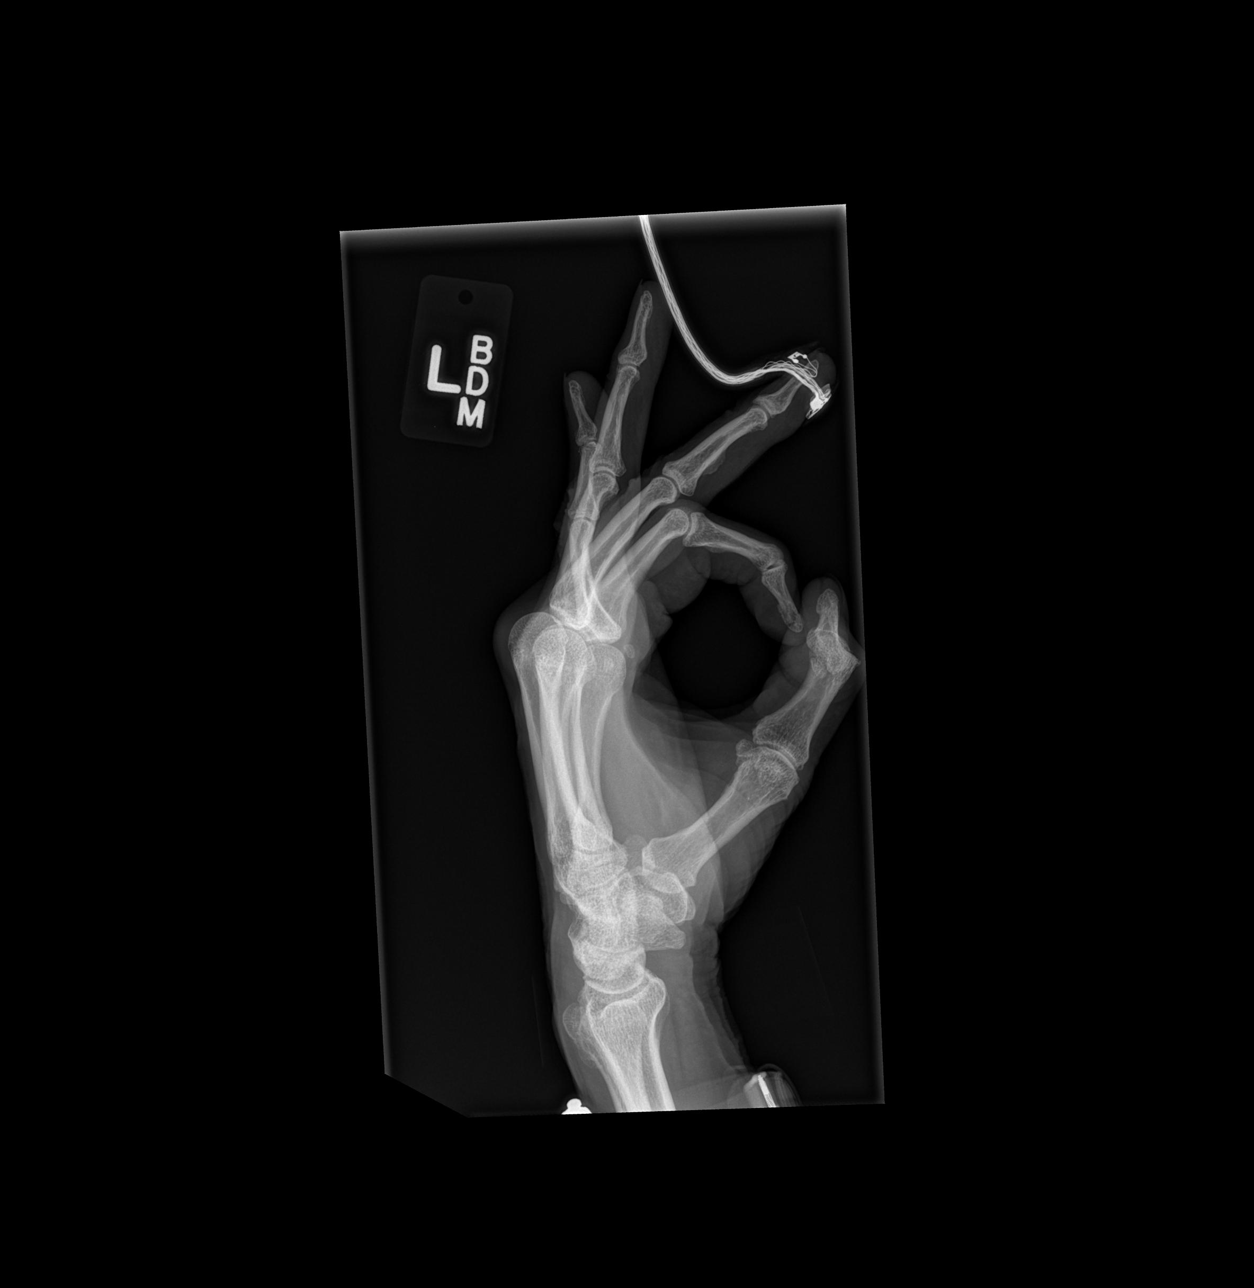

[3 of 3 positions shown; findings below may reference images not displayed]

FINDINGS: Old, healed ulnar styloid fracture.  No acute fracture or
dislocation. Calcification adjacent to the radial aspect of the
second metacarpal head.
IMPRESSION: No fracture.

## 2013-09-05 IMAGING — CT CT CERVICAL SPINE W/O CM
3 of 4 series · 6 of 14 positions shown, 7 images · non-contrast
Comparison: None

CT HEAD

CLINICAL DATA: FELL.  HIT HEAD.

CT HEAD WITHOUT CONTRAST
CT MAXILLOFACIAL WITHOUT CONTRAST
CT CERVICAL SPINE WITHOUT CONTRAST
TECHNIQUE: Multidetector CT imaging of the head, cervical spine,
and maxillofacial structures were performed using the standard
protocol without intravenous contrast. Multiplanar CT image
reconstructions of the cervical spine and maxillofacial structures
were also generated.

[Series 3: recon 2: brain · axial · 0.47mm/px · z∈[-90,-45]mm · 2 of 56 slices shown]
[im 19/56  bone]
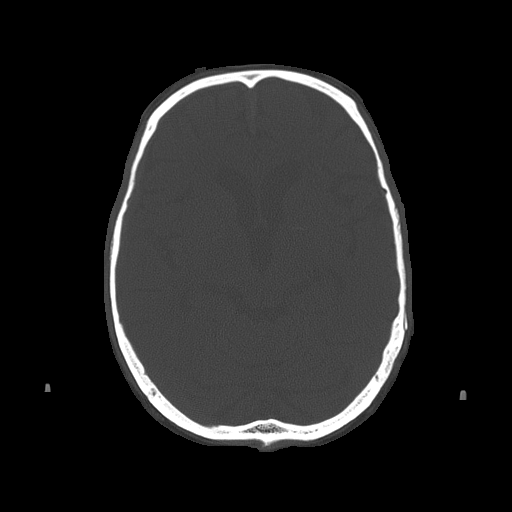
[im 37/56  bone]
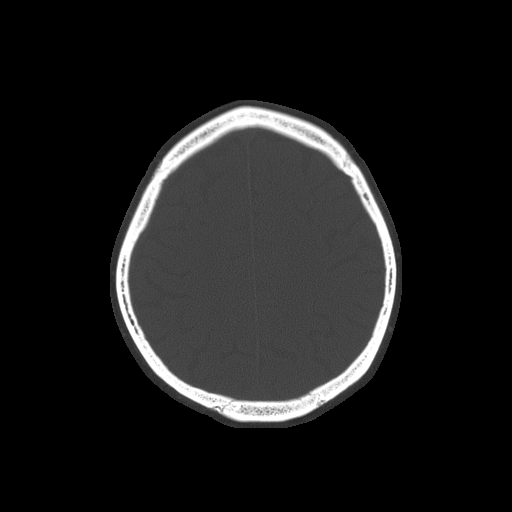

[Series 8: recon 2: c-spine · axial · 0.29mm/px · z∈[-237,-184]mm · 2 of 64 slices shown, 3 images]
[im 22/64  soft-tissue]
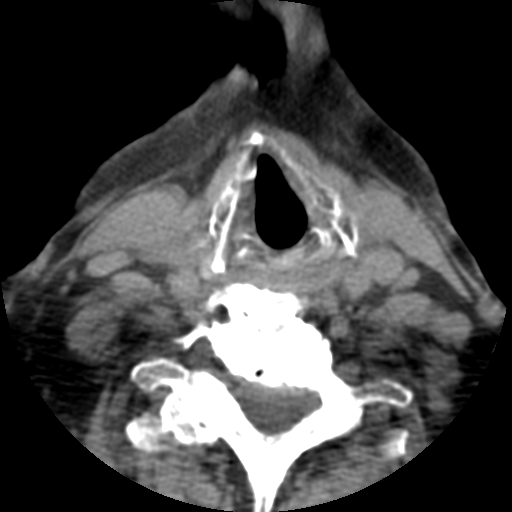
[im 22/64  bone]
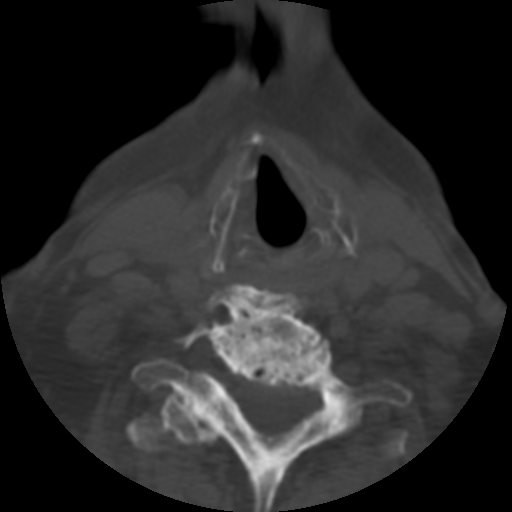
[im 43/64  bone]
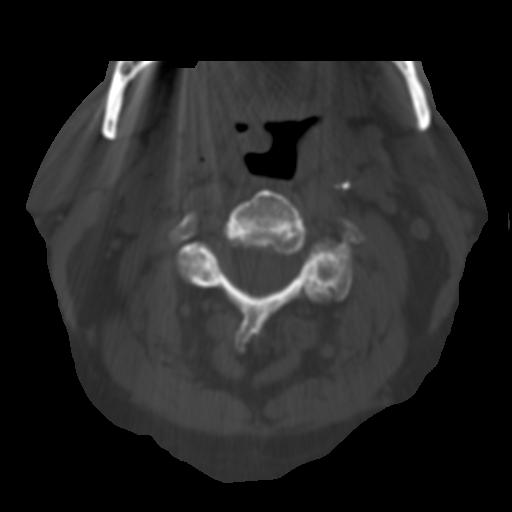

[Series 900: sagittal c-spine · sagittal · 0.49mm/px · 2 of 54 slices shown]
[im 18/54  bone]
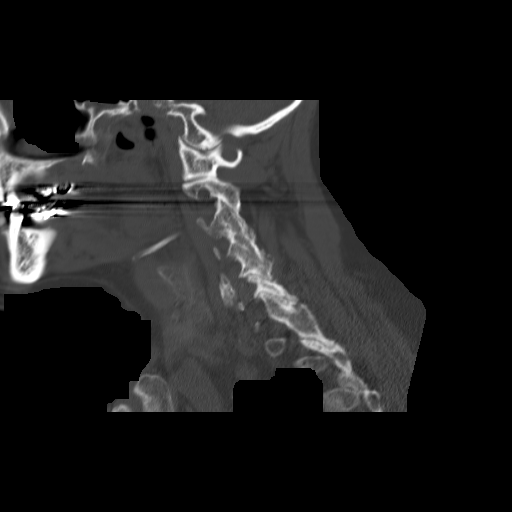
[im 36/54  bone]
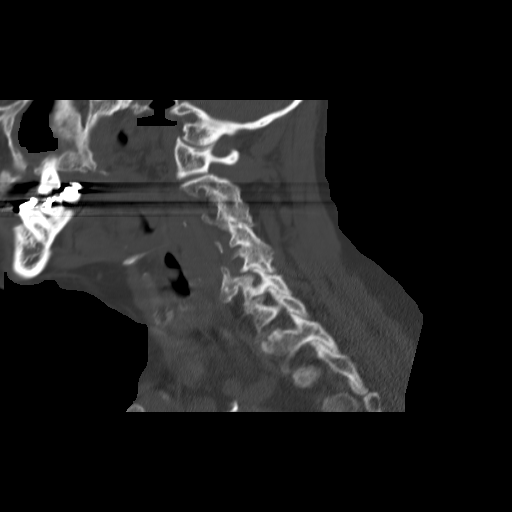

[6 of 14 positions shown; findings below may reference images not displayed]

FINDINGS: There is a supraorbital and right frontal scalp hematoma
without underlying skull fracture.  The orbital bones appear
intact.  The globes are intact.  There is dense calcification and
deformity involving the right globe.

Mild age related cerebral atrophy, ventriculomegaly and
periventricular white matter disease.

There is a small right-sided subdural hematoma extending into the
interhemispheric fissure anteriorly.  No mass effect or midline
shift.
IMPRESSION: 1.  Small right-sided subdural hematoma extending into the anterior
interhemispheric fissure.  No mass effect or midline shift.
2.  Right supraorbital and frontal scalp hematoma without
underlying skull fracture.

CT MAXILLOFACIAL
FINDINGS: No acute facial bone fractures.  The supraorbital and
right frontal scalp hematoma without underlying fractures.  The
right globe is mildly deformed and there are dense calcifications.
The paranasal sinuses are grossly clear.  There is chronic left
maxillary sinus disease.  The mandibular condyles are normally
located.  No mandible fracture.  No nasal bone fractures.
IMPRESSION: No acute facial bone fractures.

CT CERVICAL SPINE
FINDINGS: Severe degenerative cervical spondylosis with
multilevel disc disease and facet disease.  There are multilevel
auto fusions. The C1-2 pannus is noted with mild impression on the
upper cervical cord.  The overall alignment is grossly normal.  No
obvious acute fracture or abnormal prevertebral soft tissue
swelling.  The lung apices are grossly clear.
IMPRESSION: 1.  Severe degenerative cervical spondylosis with severe multilevel
disc disease and facet disease.
2.  No acute fracture.

## 2013-10-04 ENCOUNTER — Encounter: Payer: Self-pay | Admitting: Podiatry

## 2013-10-04 ENCOUNTER — Ambulatory Visit (INDEPENDENT_AMBULATORY_CARE_PROVIDER_SITE_OTHER): Payer: Medicare Other | Admitting: Podiatry

## 2013-10-04 VITALS — BP 152/74 | HR 55 | Resp 12

## 2013-10-04 DIAGNOSIS — B351 Tinea unguium: Secondary | ICD-10-CM

## 2013-10-04 DIAGNOSIS — I2510 Atherosclerotic heart disease of native coronary artery without angina pectoris: Secondary | ICD-10-CM

## 2013-10-04 NOTE — Progress Notes (Signed)
   Subjective:    Patient ID: [name], male    DOB: 09/15/1930, 80 y.o.   MRN: [id number]  HPI  PT STATED NEED TOENAILS TRIM. TOENAILS ARE THICK ITS HARD TO BEND DOWN TO CUT THEM.    Review of Systems  Eyes: Positive for visual disturbance.  Cardiovascular: Positive for leg swelling.  Genitourinary: Positive for frequency.  Musculoskeletal: Positive for back pain, gait problem and joint swelling.  All other systems reviewed and are negative.      Objective:   Physical Exam  Orientated x3 white male  Vascular: DP and PT pulses 2/4 bilaterally  Neurological: Sensation to 10 g monofilament wire intact 5/5 bilaterally Vibratory sensation intact bilaterally Ankle reflexes reactive bilaterally  Dermatological: Atrophic skin without any hair growth bilaterally Elongated, hypertrophic toenails with maximum deformity in the right hallux and second right toenails. Plantar keratoses right first MPJ  Musculoskeletal: HAV deformities bilaterally Medial positioning of lesser digits 2 through 5 bilaterally on the transverse plane       Assessment & Plan:   Assessment: Satisfactory neurovascular status Onychomycoses x10 with minimal symptoms  Plan: Nails x10 without any bleeding  Reappoint at patient's request

## 2013-10-05 ENCOUNTER — Encounter: Payer: Self-pay | Admitting: Podiatry

## 2013-11-29 ENCOUNTER — Encounter: Payer: Self-pay | Admitting: Gastroenterology

## 2014-04-10 ENCOUNTER — Encounter: Payer: Self-pay | Admitting: Cardiology

## 2014-04-10 ENCOUNTER — Ambulatory Visit (INDEPENDENT_AMBULATORY_CARE_PROVIDER_SITE_OTHER): Payer: Medicare Other | Admitting: Cardiology

## 2014-04-10 VITALS — BP 130/74 | HR 58 | Ht 68.0 in | Wt 177.0 lb

## 2014-04-10 DIAGNOSIS — I2510 Atherosclerotic heart disease of native coronary artery without angina pectoris: Secondary | ICD-10-CM

## 2014-04-10 DIAGNOSIS — I2583 Coronary atherosclerosis due to lipid rich plaque: Principal | ICD-10-CM

## 2014-04-10 NOTE — Progress Notes (Signed)
HPI The patient presents for evaluation of CAD.   In the 1980s he had an occluded right coronary artery with catheterization in [place]. He was managed medically.  He was seen in the office for chest pain and [facility]. LHC 12/22/10: dLM 30%, prox to mid LAD 30%, oOM 95%, AV branches supplied collats to PDA, RCA occluded, EF 65%. He underwent placement of a Promus DES to the OM.   Since I last saw him he has done well.  The patient denies any new symptoms such as chest discomfort, neck or arm discomfort. There has been no new shortness of breath, PND or orthopnea. There have been no reported palpitations, presyncope or syncope.  He did fall but has had no syncope.   No Known Allergies  Current Outpatient Prescriptions  Medication Sig Dispense Refill  . allopurinol (ZYLOPRIM) 300 MG tablet Take 300 mg by mouth Daily.     Marland Kitchen. aspirin 81 MG tablet Take 81 mg by mouth daily.    . brimonidine (ALPHAGAN P) 0.1 % SOLN Place 1 drop into the left eye 2 (two) times daily.    Marland Kitchen. docusate sodium (COLACE) 100 MG capsule Take 100 mg by mouth daily as needed. For constipation    . finasteride (PROSCAR) 5 MG tablet Take 5 mg by mouth Daily.     . fluticasone (FLONASE) 50 MCG/ACT nasal spray Place 2 sprays into the nose as needed for allergies. 2 sprays twice daily each side    . hydrochlorothiazide (HYDRODIURIL) 25 MG tablet Take 12.5-25 mg by mouth daily.    . hydrocortisone cream 1 % Apply 1 application topically as needed (itching).    Marland Kitchen. ibuprofen (ADVIL,MOTRIN) 200 MG tablet Take 200 mg by mouth every 6 (six) hours as needed for pain.    Marland Kitchen. lisinopril (PRINIVIL,ZESTRIL) 40 MG tablet Take 1 tablet by mouth Daily.     . metoprolol tartrate (LOPRESSOR) 25 MG tablet Take 1 tablet (25 mg total) by mouth 2 (two) times daily. 180 tablet 3  . nitroGLYCERIN (NITROSTAT) 0.4 MG SL tablet Place 0.4 mg under the tongue every 5 (five) minutes as needed.      Marland Kitchen. PE-Shark Liver Oil-Cocoa Buttr (HEMORRHOID RE)  Place 1 application rectally as needed (hemorrhoids). A ointment    . ranitidine (ZANTAC) 150 MG tablet Take 150 mg by mouth daily as needed. For heartburn    . simvastatin (ZOCOR) 80 MG tablet Take 80 mg by mouth at bedtime.    . Tamsulosin HCl (FLOMAX) 0.4 MG CAPS Take 0.4 mg by mouth Daily.     . TRAVATAN Z 0.004 % ophthalmic solution Place 1 drop into the left eye at bedtime.      No current facility-administered medications for this visit.    Past Medical History  Diagnosis Date  . GERD (gastroesophageal reflux disease)   . Glaucoma   . Hyperlipidemia   . Hypertension   . BPH (benign prostatic hyperplasia)   . CAD (coronary artery disease)     RCA occluded (Cath [place] 1980[place]);  LHC 12/22/10: dLM 30%, prox to mid LAD 30%, oOM 95%, AV branches supplied collats to PDA, RCA occluded, EF 65%.  He underwent placement of a Promus DES to the OM.  Marland Kitchen. Carotid stenosis     0 - 39% bilateral  . Gout   . Diverticulosis   . Anginal pain 12/2010  . Pneumonia 1950's  . Exertional dyspnea 10/30/2011  . Acute GI bleeding 10/30/2011  .  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 Colonoscopy    . Cystectomy      right cheek  . Transurethral resection of prostate  ~ 2007  . Eye muscle surgery  1953    right  . Coronary angioplasty with stent placement  12/2010    "1"   ROS:  As stated in the HPI and negative for all other systems.  PHYSICAL EXAM BP 130/74 mmHg  Pulse 58  Ht  (1.727 m)  Wt 177 lb (80.287 kg)  BMI 26.92 kg/m2 GENERAL:  Well appearing HEENT:  Right lens opacification, fundi not visualized, oral mucosa unremarkable NECK:  No jugular venous distention, waveform within normal limits, carotid upstroke brisk and symmetric, no bruits, no thyromegaly LUNGS:  Clear to auscultation bilaterally CHEST:  Unremarkable HEART:  PMI not displaced or sustained,S1 and S2 within normal limits, no S3, no S4, no  clicks, no rubs, no murmurs ABD:  Flat, positive bowel sounds normal in frequency in pitch, no bruits, no rebound, no guarding, no midline pulsatile mass, no hepatomegaly, no splenomegaly EXT:  2 plus pulses throughout, no edema, no cyanosis no clubbing    EKG:  Sinus bradycardia, rate 58, voltage criteria for left ventricular hypertrophy, no acute ST-T wave changes with PACs.  04/10/2014   ASSESSMENT AND PLAN  CAD (coronary artery disease) -  The patient has no new sypmtoms.  No further cardiovascular testing is indicated.  We will continue with aggressive risk reduction and meds as listed.  We talked about an exercise plan since his knees are reducing his walking.   HTN (hypertension) -  The blood pressure is at target. No change in medications is indicated. We will continue with therapeutic lifestyle changes (TLC).  Carotid stenosis -  He had mild plaquing 2012.  I do not think that this needs to be repeated at this time.  No change in therapy is indicated.    Dyslipidemia -  This is followed by Gaspar Garbe, MD

## 2014-04-10 NOTE — Patient Instructions (Signed)
Your physician recommends that you schedule a follow-up appointment in: one year with Dr. Hochrein  

## 2014-10-01 ENCOUNTER — Other Ambulatory Visit: Payer: Self-pay | Admitting: Cardiology

## 2015-03-12 ENCOUNTER — Telehealth: Payer: Self-pay | Admitting: Cardiology

## 2015-03-12 MED ORDER — METOPROLOL TARTRATE 25 MG PO TABS: ORAL_TABLET | ORAL | Status: DC

## 2015-03-12 NOTE — Telephone Encounter (Signed)
°*  STAT* If patient is at the pharmacy, call can be transferred to refill team.   1. Which medications need to be refilled? (please list name of each medication and dose if known) Metoprolol-new prescription-new pharmacy  2. Which pharmacy/location (including street and city if local pharmacy) is medication to be sent to?[facility][phone]  3. Do they need a 30 day or 90 day supply? 180 and refills

## 2015-03-12 NOTE — Telephone Encounter (Signed)
Refill sent to the pharmacy electronically.  

## 2015-04-11 ENCOUNTER — Encounter: Payer: Self-pay | Admitting: Cardiology

## 2015-04-11 ENCOUNTER — Ambulatory Visit (INDEPENDENT_AMBULATORY_CARE_PROVIDER_SITE_OTHER): Payer: Medicare Other | Admitting: Cardiology

## 2015-04-11 VITALS — BP 160/70 | HR 48 | Ht 68.0 in | Wt 179.6 lb

## 2015-04-11 DIAGNOSIS — I6523 Occlusion and stenosis of bilateral carotid arteries: Secondary | ICD-10-CM

## 2015-04-11 DIAGNOSIS — I2510 Atherosclerotic heart disease of native coronary artery without angina pectoris: Secondary | ICD-10-CM | POA: Diagnosis not present

## 2015-04-11 NOTE — Progress Notes (Signed)
HPI The patient presents for evaluation of CAD.   In the 1980s he had an occluded right coronary artery with catheterization in [place]. He was managed medically.  He was seen in the office for chest pain and set up for [facility] on 10/22. LHC 12/22/10: dLM 30%, prox to mid LAD 30%, oOM 95%, AV branches supplied collats to PDA, RCA occluded, EF 65%. He underwent placement of a Promus DES to the OM.   Since I last saw him he has done well.  The patient denies any new symptoms such as chest discomfort, neck or arm discomfort. There has been no new shortness of breath, PND or orthopnea. There have been no reported palpitations, presyncope or syncope.  He is walking at [employer] daily.   No Known Allergies  Current Outpatient Prescriptions  Medication Sig Dispense Refill  . allopurinol (ZYLOPRIM) 300 MG tablet Take 300 mg by mouth Daily.     Marland Kitchen aspirin 81 MG tablet Take 81 mg by mouth daily.    . brimonidine (ALPHAGAN P) 0.1 % SOLN Place 1 drop into the left eye 2 (two) times daily.    Marland Kitchen docusate sodium (COLACE) 100 MG capsule Take 100 mg by mouth daily as needed. For constipation    . finasteride (PROSCAR) 5 MG tablet Take 5 mg by mouth Daily.     . hydrochlorothiazide (HYDRODIURIL) 25 MG tablet Take 12.5-25 mg by mouth daily.    Marland Kitchen ibuprofen (ADVIL,MOTRIN) 200 MG tablet Take 200 mg by mouth every 6 (six) hours as needed for pain.    Marland Kitchen lisinopril (PRINIVIL,ZESTRIL) 40 MG tablet Take 1 tablet by mouth Daily.     . metoprolol tartrate (LOPRESSOR) 25 MG tablet TAKE 1 BY MOUTH TWICE DAILY 180 tablet 3  . nitroGLYCERIN (NITROSTAT) 0.4 MG SL tablet Place 0.4 mg under the tongue every 5 (five) minutes as needed.      . ranitidine (ZANTAC) 150 MG tablet Take 150 mg by mouth daily as needed. For heartburn    . simvastatin (ZOCOR) 80 MG tablet Take 80 mg by mouth at bedtime.    . Tamsulosin HCl (FLOMAX) 0.4 MG CAPS Take 0.4 mg by mouth Daily.     . TRAVATAN Z 0.004 % ophthalmic solution Place 1 drop  into the left eye at bedtime.      No current facility-administered medications for this visit.    Past Medical History  Diagnosis Date  . GERD (gastroesophageal reflux disease)   . Glaucoma   . Hyperlipidemia   . Hypertension   . BPH (benign prostatic hyperplasia)   . CAD (coronary artery disease)     RCA occluded (Cath [place] 1980s);  LHC 12/22/10: dLM 30%, prox to mid LAD 30%, oOM 95%, AV branches supplied collats to PDA, RCA occluded, EF 65%.  He underwent placement of a Promus DES to the OM.  Marland Kitchen Carotid stenosis     0 - 39% bilateral  . Gout   . Diverticulosis   . Anginal pain (HCC) 12/2010  . Pneumonia 1950's  . Exertional dyspnea 10/30/2011  . Acute GI bleeding 10/30/2011  . 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 Colonoscopy    . Cystectomy      right cheek  . Transurethral resection of prostate  ~ 2007  . Eye muscle surgery  1953    right  . Coronary angioplasty with stent placement  12/2010    "  1"   ROS:  As stated in the HPI and negative for all other systems.  PHYSICAL EXAM BP 160/70 mmHg  Pulse 48  Ht  (1.727 m)  Wt 179 lb 9 oz (81.449 kg)  BMI 27.31 kg/m2 GENERAL:  Well appearing HEENT:  Right lens opacification, fundi not visualized, oral mucosa unremarkable NECK:  No jugular venous distention, waveform within normal limits, carotid upstroke brisk and symmetric, no bruits, no thyromegaly LUNGS:  Clear to auscultation bilaterally CHEST:  Unremarkable HEART:  PMI not displaced or sustained,S1 and S2 within normal limits, no S3, no S4, no clicks, no rubs, no murmurs ABD:  Flat, positive bowel sounds normal in frequency in pitch, no bruits, no rebound, no guarding, no midline pulsatile mass, no hepatomegaly, no splenomegaly EXT:  2 plus pulses throughout, no edema, no cyanosis no clubbing    EKG:  Sinus bradycardia, rate 48, voltage criteria for left ventricular hypertrophy,  no acute ST-T wave changes with PACs.  04/11/2015   ASSESSMENT AND PLAN  CAD (coronary artery disease) -  I will bring the patient back for a POET (Plain Old Exercise Test). This will allow me to screen for obstructive coronary disease, risk stratify and very importantly provide a prescription for exercise.  HTN (hypertension) -  His BP is mildly elevated.  He will keep a diary and let me know the results.  I will also assess at the time of his POET (Plain Old Exercise Treadmill).   Carotid stenosis -  He had mild plaque in 2012.  I will repeat a Doppler  Dyslipidemia -  This is followed by Gaspar Garbe, MD.  I was unable to get the lipid results but I would be happy to review when they are available.

## 2015-04-11 NOTE — Patient Instructions (Signed)
Medication Instructions:   NO CHANGE  Testing/Procedures:  Your physician has requested that you have a carotid duplex. This test is an ultrasound of the carotid arteries in your neck. It looks at blood flow through these arteries that supply the brain with blood. Allow one hour for this exam. There are no restrictions or special instructions.   Your physician has requested that you have an exercise tolerance test. For further information please visit [email or web address] Please also follow instruction sheet, as given.    Follow-Up:  Your physician wants you to follow-up in: ONE YEAR WITH DR Antoine Poche You will receive a reminder letter in the mail two months in advance. If you don't receive a letter, please call our office to schedule the follow-up appointment.   Exercise Stress Electrocardiogram An exercise stress electrocardiogram is a test to check how blood flows to your heart. It is done to find areas of poor blood flow. You will need to walk on a treadmill for this test. The electrocardiogram will record your heartbeat when you are at rest and when you are exercising. BEFORE THE PROCEDURE  Do not have drinks with caffeine or foods with caffeine for 24 hours before the test, or as told by your doctor. This includes coffee, tea (even decaf tea), sodas, chocolate, and cocoa.  Follow your doctor's instructions about eating and drinking before the test.  Ask your doctor what medicines you should or should not take before the test. Take your medicines with water unless told by your doctor not to.  If you use an inhaler, bring it with you to the test.  Bring a snack to eat after the test.  Do not  smoke for 4 hours before the test.  Do not put lotions, powders, creams, or oils on your chest before the test.  Wear comfortable shoes and clothing. PROCEDURE  You will have patches put on your chest. Small areas of your chest may need to be shaved. Wires will be connected to the  patches.  Your heart rate will be watched while you are resting and while you are exercising.  You will walk on the treadmill. The treadmill will slowly get faster to raise your heart rate.  The test will take about 1-2 hours. AFTER THE PROCEDURE  Your heart rate and blood pressure will be watched after the test.  You may return to your normal diet, activities, and medicines or as told by your doctor.   This information is not intended to replace advice given to you by your health care provider. Make sure you discuss any questions you have with your health care provider.   Document Released: 08/05/2007 Document Revised: 03/09/2014 Document Reviewed: 10/24/2012 Elsevier Interactive Patient Education [employer]

## 2015-04-12 ENCOUNTER — Telehealth: Payer: Self-pay | Admitting: *Deleted

## 2015-04-12 NOTE — Telephone Encounter (Signed)
Pt called with concerns about walking on the treadmill. He has bad knees and feels he will not be able to walk on the incline enough to give [place] the informations we need. Will forward for dr hochrein's review and ? If we need to change test

## 2015-04-14 NOTE — Telephone Encounter (Signed)
Will he try to do at least a Modified Bruce (the test will need to be ordered as this.)   I want to rule out ischemia at whatever heart rate he can get to.

## 2015-04-17 NOTE — Telephone Encounter (Signed)
Pt is calling back to speak with you based on a conversation you all had on 2/10. Please f/u with him

## 2015-04-17 NOTE — Telephone Encounter (Signed)
Pt is aware he will be scheduled for a Modified Bruce GXT.

## 2015-04-23 ENCOUNTER — Telehealth (HOSPITAL_COMMUNITY): Payer: Self-pay

## 2015-04-23 NOTE — Telephone Encounter (Signed)
Encounter complete. 

## 2015-04-25 ENCOUNTER — Encounter (HOSPITAL_COMMUNITY): Payer: Self-pay | Admitting: *Deleted

## 2015-04-25 ENCOUNTER — Ambulatory Visit (HOSPITAL_COMMUNITY)
Admission: RE | Admit: 2015-04-25 | Discharge: 2015-04-25 | Disposition: A | Discharge status: Home / Self Care | Payer: Medicare Other | Source: Ambulatory Visit | Attending: Cardiology | Admitting: Cardiology

## 2015-04-25 DIAGNOSIS — I2510 Atherosclerotic heart disease of native coronary artery without angina pectoris: Secondary | ICD-10-CM | POA: Diagnosis not present

## 2015-04-25 DIAGNOSIS — E785 Hyperlipidemia, unspecified: Secondary | ICD-10-CM | POA: Insufficient documentation

## 2015-04-25 DIAGNOSIS — I6523 Occlusion and stenosis of bilateral carotid arteries: Secondary | ICD-10-CM | POA: Diagnosis present

## 2015-04-25 DIAGNOSIS — I10 Essential (primary) hypertension: Secondary | ICD-10-CM | POA: Diagnosis not present

## 2015-04-25 NOTE — Progress Notes (Unsigned)
Patient ID: [name], male   DOB: 28-Feb-1931, 82 y.o.   MRN: [id number] [name] had difficulty walking from lobby, questioned him about his gate and he stated he had bad knees, and he had called the office stating he couldn't walk the treadmill and was told he would do a modified test.   He said he would try, so he was prepped and put on the treadmill, flat at 1 mph and couldn't walk d/t gate, instability, and pushing himself backward.  [name] came in and witnessed I was holding him on by belt and the agreement was that he was unable to walk a Modified or Standard protocol.  Patient was very appreciative that his inability to walk was seen.

## 2016-04-03 NOTE — Progress Notes (Signed)
HPI The patient presents for evaluation of CAD.   In the 1980s he had an occluded right coronary artery with catheterization in [place]. He was managed medically.   LHC 12/22/10: dLM 30%, prox to mid LAD 30%, oOM 95%, AV branches supplied collats to PDA, RCA occluded, EF 65%. He underwent placement of a Promus DES to the OM.   Since I last saw him he has he has done very well. .  I did try to send him for a POET (Plain Old Exercise Treadmill) last year but he was unable to walk on the treadmill.   The patient denies any new symptoms such as chest discomfort, neck or arm discomfort. There has been no new shortness of breath, PND or orthopnea. There have been no reported palpitations, presyncope or syncope.   No Known Allergies  Current Outpatient Prescriptions  Medication Sig Dispense Refill  . allopurinol (ZYLOPRIM) 300 MG tablet Take 300 mg by mouth Daily.     Marland Kitchen. aspirin 81 MG tablet Take 81 mg by mouth daily.    . brimonidine (ALPHAGAN P) 0.1 % SOLN Place 1 drop into the left eye 2 (two) times daily.    Marland Kitchen. docusate sodium (COLACE) 100 MG capsule Take 100 mg by mouth daily as needed. For constipation    . finasteride (PROSCAR) 5 MG tablet Take 5 mg by mouth Daily.     . hydrochlorothiazide (HYDRODIURIL) 25 MG tablet Take 12.5-25 mg by mouth daily.    Marland Kitchen. ibuprofen (ADVIL,MOTRIN) 200 MG tablet Take 200 mg by mouth every 6 (six) hours as needed for pain.    Marland Kitchen. lisinopril (PRINIVIL,ZESTRIL) 40 MG tablet Take 1 tablet by mouth Daily.     . metoprolol tartrate (LOPRESSOR) 25 MG tablet TAKE 1 BY MOUTH TWICE DAILY 180 tablet 3  . nitroGLYCERIN (NITROSTAT) 0.4 MG SL tablet Place 1 tablet (0.4 mg total) under the tongue every 5 (five) minutes as needed. 25 tablet 2  . ranitidine (ZANTAC) 150 MG tablet Take 150 mg by mouth daily as needed. For heartburn    . simvastatin (ZOCOR) 80 MG tablet Take 80 mg by mouth at bedtime.    . Tamsulosin HCl (FLOMAX) 0.4 MG CAPS Take 0.4 mg by mouth Daily.     .  TRAVATAN Z 0.004 % ophthalmic solution Place 1 drop into the left eye at bedtime.      No current facility-administered medications for this visit.     Past Medical History:  Diagnosis Date  . Acute GI bleeding 10/30/2011  . Anginal pain (HCC) 12/2010  . 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 BPH (benign prostatic hyperplasia)   . CAD (coronary artery disease)    RCA occluded (Cath [place] 1980[place]);  LHC 12/22/10: dLM 30%, prox to mid LAD 30%, oOM 95%, AV branches supplied collats to PDA, RCA occluded, EF 65%.  He underwent placement of a Promus DES to the OM.  Marland Kitchen. Carotid stenosis    0 - 39% bilateral  . Diverticulosis   . Exertional dyspnea 10/30/2011  . GERD (gastroesophageal reflux disease)   . Glaucoma   . Gout   . Hyperlipidemia   . Hypertension   . Pneumonia 1950's    Past Surgical History:  Procedure Laterality Date  . COLONOSCOPY    . CORONARY ANGIOPLASTY WITH STENT PLACEMENT  12/2010   "1"  . CYSTECTOMY     right cheek  . EYE MUSCLE SURGERY  1953   right  .  TRANSURETHRAL RESECTION OF PROSTATE  ~ 2007   ROS:    As stated in the HPI and negative for all other systems.  PHYSICAL EXAM BP 124/64   Pulse (!) 54   Ht 5\' 8"  (1.727 m)   Wt 177 lb 6.4 oz (80.5 kg)   BMI 26.97 kg/m  GENERAL:  Well appearing HEENT:  Right lens opacification, fundi not visualized, oral mucosa unremarkable NECK:  No jugular venous distention, waveform within normal limits, carotid upstroke brisk and symmetric, no bruits, no thyromegaly LUNGS:  Clear to auscultation bilaterally CHEST:  Unremarkable HEART:  PMI not displaced or sustained,S1 and S2 within normal limits, no S3, no S4, no clicks, no rubs, no murmurs ABD:  Flat, positive bowel sounds normal in frequency in pitch, no bruits, no rebound, no guarding, no midline pulsatile mass, no hepatomegaly, no splenomegaly EXT:  2 plus pulses throughout, no edema, no cyanosis no clubbing    EKG:  Sinus bradycardia, rate  54, voltage criteria for left ventricular hypertrophy, no acute ST-T wave changes with PACs.  04/08/2016   ASSESSMENT AND PLAN  CAD (coronary artery disease) -  The patient has no new sypmtoms.  No further cardiovascular testing is indicated.  We will continue with aggressive risk reduction and meds as listed.  HTN (hypertension) -  The blood pressure is at target. No change in medications is indicated. We will continue with therapeutic lifestyle changes (TLC).  Dyslipidemia -  This is followed by Gaspar Garbe, MD. I will request the labs for our records.

## 2016-04-07 ENCOUNTER — Encounter: Payer: Self-pay | Admitting: Cardiology

## 2016-04-07 ENCOUNTER — Ambulatory Visit (INDEPENDENT_AMBULATORY_CARE_PROVIDER_SITE_OTHER): Payer: Medicare Other | Admitting: Cardiology

## 2016-04-07 VITALS — BP 124/64 | HR 54 | Ht 68.0 in | Wt 177.4 lb

## 2016-04-07 DIAGNOSIS — I2583 Coronary atherosclerosis due to lipid rich plaque: Secondary | ICD-10-CM

## 2016-04-07 DIAGNOSIS — I2510 Atherosclerotic heart disease of native coronary artery without angina pectoris: Secondary | ICD-10-CM

## 2016-04-07 MED ORDER — NITROGLYCERIN 0.4 MG SL SUBL: 0.4000 mg | SUBLINGUAL_TABLET | SUBLINGUAL | 2 refills | Status: DC | PRN

## 2016-04-07 NOTE — Patient Instructions (Signed)

## 2016-04-08 ENCOUNTER — Encounter: Payer: Self-pay | Admitting: Cardiology

## 2017-04-07 ENCOUNTER — Encounter: Payer: Self-pay | Admitting: Cardiology

## 2017-04-11 NOTE — Progress Notes (Signed)
HPI The patient presents for evaluation of CAD.   In the 1980s he had an occluded right coronary artery with catheterization in [place]. He was managed medically.   LHC 12/22/10: dLM 30%, prox to mid LAD 30%, OM 95%, AV branches supplied collaterals to the PDA, RCA occluded, EF 65%. He underwent placement of a Promus DES to the OM.     Since I last saw him he is done well.  He has been very limited by knee pain but has had no cardiac complaints.  The patient denies any new symptoms such as chest discomfort, neck or arm discomfort. There has been no new shortness of breath, PND or orthopnea. There have been no reported palpitations, presyncope or syncope.   No Known Allergies  Current Outpatient Medications  Medication Sig Dispense Refill  . allopurinol (ZYLOPRIM) 300 MG tablet Take 300 mg by mouth Daily.     Marland Kitchen aspirin 81 MG tablet Take 81 mg by mouth daily.    . brimonidine (ALPHAGAN P) 0.1 % SOLN Place 1 drop into the left eye 2 (two) times daily.    Marland Kitchen docusate sodium (COLACE) 100 MG capsule Take 100 mg by mouth daily as needed. For constipation    . finasteride (PROSCAR) 5 MG tablet Take 5 mg by mouth Daily.     . hydrochlorothiazide (HYDRODIURIL) 25 MG tablet Take 12.5-25 mg by mouth daily.    Marland Kitchen ibuprofen (ADVIL,MOTRIN) 200 MG tablet Take 200 mg by mouth every 6 (six) hours as needed for pain.    Marland Kitchen lisinopril (PRINIVIL,ZESTRIL) 40 MG tablet Take 1 tablet by mouth Daily.     . metoprolol tartrate (LOPRESSOR) 25 MG tablet TAKE 1 BY MOUTH TWICE DAILY 180 tablet 3  . nitroGLYCERIN (NITROSTAT) 0.4 MG SL tablet Place 1 tablet (0.4 mg total) under the tongue every 5 (five) minutes as needed. 25 tablet 2  . ranitidine (ZANTAC) 150 MG tablet Take 150 mg by mouth daily as needed. For heartburn    . simvastatin (ZOCOR) 80 MG tablet Take 80 mg by mouth at bedtime.    . Tamsulosin HCl (FLOMAX) 0.4 MG CAPS Take 0.4 mg by mouth Daily.     . TRAVATAN Z 0.004 % ophthalmic solution Place 1 drop into  the left eye at bedtime.      No current facility-administered medications for this visit.     Past Medical History:  Diagnosis Date  . Acute GI bleeding 10/30/2011  . Anginal pain (HCC) 12/2010  . 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 BPH (benign prostatic hyperplasia)   . CAD (coronary artery disease)    RCA occluded (Cath [place] 1980s);  LHC 12/22/10: dLM 30%, prox to mid LAD 30%, oOM 95%, AV branches supplied collats to PDA, RCA occluded, EF 65%.  He underwent placement of a Promus DES to the OM.  Marland Kitchen Carotid stenosis    0 - 39% bilateral  . Diverticulosis   . Exertional dyspnea 10/30/2011  . GERD (gastroesophageal reflux disease)   . Glaucoma   . Gout   . Hyperlipidemia   . Hypertension   . Pneumonia 1950's    Past Surgical History:  Procedure Laterality Date  . COLONOSCOPY    . CORONARY ANGIOPLASTY WITH STENT PLACEMENT  12/2010   "1"  . CYSTECTOMY     right cheek  . EYE MUSCLE SURGERY  1953   right  . TRANSURETHRAL RESECTION OF PROSTATE  ~ 2007  ROS:    As stated in the HPI and negative for all other systems.   PHYSICAL EXAM BP 120/76 (BP Location: Left Arm, Patient Position: Sitting, Cuff Size: Normal)   Pulse (!) 41   Ht 5\' 8"  (1.727 m)   Wt 180 lb (81.6 kg)   BMI 27.37 kg/m   GENERAL:  Well appearing HEENT: Right lens opacification NECK:  No jugular venous distention, waveform within normal limits, carotid upstroke brisk and symmetric, no bruits, no thyromegaly LUNGS:  Clear to auscultation bilaterally CHEST:  Unremarkable HEART:  PMI not displaced or sustained,S1 and S2 within normal limits, no S3, no S4, no clicks, no rubs, no murmurs ABD:  Flat, positive bowel sounds normal in frequency in pitch, no bruits, no rebound, no guarding, no midline pulsatile mass, no hepatomegaly, no splenomegaly EXT:  2 plus pulses throughout, no edema, no cyanosis no clubbing     EKG:  Sinus bradycardia, rate 41, voltage criteria for left  ventricular hypertrophy, no acute ST-T wave changes with PACs.  04/13/2017   ASSESSMENT AND PLAN  CAD (coronary artery disease) -  The patient has no new sypmtoms.  No further cardiovascular testing is indicated.  We will continue with aggressive risk reduction and meds as listed.  HTN (hypertension) -  The blood pressure is at target. No change in medications is indicated. We will continue with therapeutic lifestyle changes (TLC).  Dyslipidemia -  LDL was 58.  No change in therapy.

## 2017-04-13 ENCOUNTER — Ambulatory Visit (INDEPENDENT_AMBULATORY_CARE_PROVIDER_SITE_OTHER): Payer: Medicare Other | Admitting: Cardiology

## 2017-04-13 ENCOUNTER — Encounter: Payer: Self-pay | Admitting: Cardiology

## 2017-04-13 VITALS — BP 120/76 | HR 41 | Ht 68.0 in | Wt 180.0 lb

## 2017-04-13 DIAGNOSIS — I2583 Coronary atherosclerosis due to lipid rich plaque: Secondary | ICD-10-CM | POA: Diagnosis not present

## 2017-04-13 DIAGNOSIS — E785 Hyperlipidemia, unspecified: Secondary | ICD-10-CM | POA: Diagnosis not present

## 2017-04-13 DIAGNOSIS — I2510 Atherosclerotic heart disease of native coronary artery without angina pectoris: Secondary | ICD-10-CM

## 2017-04-13 DIAGNOSIS — I10 Essential (primary) hypertension: Secondary | ICD-10-CM | POA: Diagnosis not present

## 2017-04-13 NOTE — Patient Instructions (Signed)
Medication Instructions:  Continue current medications  If you need a refill on your cardiac medications before your next appointment, please call your pharmacy.  Labwork: None Ordered   Testing/Procedures: None Ordered  Follow-Up: Your physician wants you to follow-up in: 1 Year. You should receive a reminder letter in the mail two months in advance. If you do not receive a letter, please call our office [phone].    Thank you for choosing [facility]!!      

## 2018-04-26 ENCOUNTER — Ambulatory Visit: Payer: Medicare Other | Admitting: Cardiology

## 2018-04-27 NOTE — Progress Notes (Signed)
HPI The patient presents for evaluation of CAD.   In the 1980s he had an occluded right coronary artery with catheterization in [place]. He was managed medically.   LHC 12/22/10: dLM 30%, prox to mid LAD 30%, OM 95%, AV branches supplied collaterals to the PDA, RCA occluded, EF 65%. He underwent placement of a Promus DES to the OM.     Since I last saw him he has done well.  He denies any cardiovascular symptoms such as chest pressure, neck or arm discomfort.  He has had no palpitations, presyncope or syncope.  He has had no weight gain or edema.  He moves very slowly because of knee pain.    No Known Allergies  Current Outpatient Medications  Medication Sig Dispense Refill  . allopurinol (ZYLOPRIM) 300 MG tablet Take 300 mg by mouth Daily.     Marland Kitchen aspirin 81 MG tablet Take 81 mg by mouth daily.    Marland Kitchen docusate sodium (COLACE) 100 MG capsule Take 100 mg by mouth daily as needed. For constipation    . finasteride (PROSCAR) 5 MG tablet Take 5 mg by mouth Daily.     . hydrochlorothiazide (HYDRODIURIL) 25 MG tablet Take 12.5-25 mg by mouth daily.    Marland Kitchen ibuprofen (ADVIL,MOTRIN) 200 MG tablet Take 200 mg by mouth every 6 (six) hours as needed for pain.    Marland Kitchen lisinopril (PRINIVIL,ZESTRIL) 40 MG tablet Take 1 tablet by mouth Daily.     . metoprolol tartrate (LOPRESSOR) 25 MG tablet TAKE 1 BY MOUTH TWICE DAILY 180 tablet 3  . nitroGLYCERIN (NITROSTAT) 0.4 MG SL tablet Place 1 tablet (0.4 mg total) under the tongue every 5 (five) minutes as needed. 25 tablet 2  . ranitidine (ZANTAC) 150 MG tablet Take 150 mg by mouth daily as needed. For heartburn    . simvastatin (ZOCOR) 80 MG tablet Take 80 mg by mouth at bedtime.    . Tamsulosin HCl (FLOMAX) 0.4 MG CAPS Take 0.4 mg by mouth Daily.     . TRAVATAN Z 0.004 % ophthalmic solution Place 1 drop into the left eye at bedtime.     . timolol (TIMOPTIC) 0.5 % ophthalmic solution Place 1 drop into the left eye 2 (two) times daily.     No current  facility-administered medications for this visit.     Past Medical History:  Diagnosis Date  . Acute GI bleeding 10/30/2011  . Anginal pain (HCC) 12/2010  . 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 BPH (benign prostatic hyperplasia)   . CAD (coronary artery disease)    RCA occluded (Cath [place] 1980s);  LHC 12/22/10: dLM 30%, prox to mid LAD 30%, oOM 95%, AV branches supplied collats to PDA, RCA occluded, EF 65%.  He underwent placement of a Promus DES to the OM.  Marland Kitchen Carotid stenosis    0 - 39% bilateral  . Diverticulosis   . Exertional dyspnea 10/30/2011  . GERD (gastroesophageal reflux disease)   . Glaucoma   . Gout   . Hyperlipidemia   . Hypertension   . Pneumonia 1950's    Past Surgical History:  Procedure Laterality Date  . COLONOSCOPY    . CORONARY ANGIOPLASTY WITH STENT PLACEMENT  12/2010   "1"  . CYSTECTOMY     right cheek  . EYE MUSCLE SURGERY  1953   right  . TRANSURETHRAL RESECTION OF PROSTATE  ~ 2007    ROS:    As stated in  the HPI and negative for all other systems.   PHYSICAL EXAM BP (!) 183/72   Pulse (!) 56   Wt 185 lb 9.6 oz (84.2 kg)   BMI 28.22 kg/m   GENERAL:  Well appearing NECK:  No jugular venous distention, waveform within normal limits, carotid upstroke brisk and symmetric, no bruits, no thyromegaly LUNGS:  Clear to auscultation bilaterally CHEST:  Unremarkable HEART:  PMI not displaced or sustained,S1 and S2 within normal limits, no S3, no S4, no clicks, no rubs, no murmurs ABD:  Flat, positive bowel sounds normal in frequency in pitch, no bruits, no rebound, no guarding, no midline pulsatile mass, no hepatomegaly, no splenomegaly EXT:  2 plus pulses throughout, no edema, no cyanosis no clubbing    EKG:  Sinus bradycardia, rate 56, voltage criteria for left ventricular hypertrophy, no acute ST-T wave changes with PACs.  04/28/2018   ASSESSMENT AND PLAN  CAD (coronary artery disease) -  The patient has no new  symptoms.  No change in therapy.    HTN (hypertension) -  The blood pressure is not at target.  However, this is somewhat unusual.  He can keep a blood pressure diary and make changes to his meds based on these readings.  I gave him targets.   Dyslipidemia -  LDL was 58.  He will continue the meds as listed.

## 2018-04-28 ENCOUNTER — Encounter: Payer: Self-pay | Admitting: Cardiology

## 2018-04-28 ENCOUNTER — Ambulatory Visit (INDEPENDENT_AMBULATORY_CARE_PROVIDER_SITE_OTHER): Payer: Medicare Other | Admitting: Cardiology

## 2018-04-28 VITALS — BP 183/72 | HR 56 | Wt 185.6 lb

## 2018-04-28 DIAGNOSIS — I2583 Coronary atherosclerosis due to lipid rich plaque: Secondary | ICD-10-CM | POA: Diagnosis not present

## 2018-04-28 DIAGNOSIS — E785 Hyperlipidemia, unspecified: Secondary | ICD-10-CM | POA: Diagnosis not present

## 2018-04-28 DIAGNOSIS — I2510 Atherosclerotic heart disease of native coronary artery without angina pectoris: Secondary | ICD-10-CM

## 2018-04-28 DIAGNOSIS — I10 Essential (primary) hypertension: Secondary | ICD-10-CM

## 2018-04-28 NOTE — Patient Instructions (Signed)
Medication Instructions:  Continue current medications  If you need a refill on your cardiac medications before your next appointment, please call your pharmacy.  Labwork: None Ordered   Testing/Procedures: None Ordered  Special Instructions: Keep a daily blood pressure diary  Follow-Up: You will need a follow up appointment in 1 Year.  Please call our office 2 months in advance to schedule this appointment.  You may see Dr Antoine Poche or one of the following Advanced Practice Providers on your designated Care Team:   Theodore Demark, PA-C . Joni Reining, DNP, ANP   At [facility], you and your health needs are our priority.  As part of our continuing mission to provide you with exceptional heart care, we have created designated Provider Care Teams.  These Care Teams include your primary Cardiologist (physician) and Advanced Practice Providers (APPs -  Physician Assistants and Nurse Practitioners) who all work together to provide you with the care you need, when you need it.   Thank you for choosing [facility] at [facility]!!

## 2019-03-17 DIAGNOSIS — N182 Chronic kidney disease, stage 2 (mild): Secondary | ICD-10-CM | POA: Diagnosis not present

## 2019-03-17 DIAGNOSIS — I6529 Occlusion and stenosis of unspecified carotid artery: Secondary | ICD-10-CM | POA: Diagnosis not present

## 2019-03-17 DIAGNOSIS — I1310 Hypertensive heart and chronic kidney disease without heart failure, with stage 1 through stage 4 chronic kidney disease, or unspecified chronic kidney disease: Secondary | ICD-10-CM | POA: Diagnosis not present

## 2019-03-17 DIAGNOSIS — R809 Proteinuria, unspecified: Secondary | ICD-10-CM | POA: Diagnosis not present

## 2019-03-23 DIAGNOSIS — R7302 Impaired glucose tolerance (oral): Secondary | ICD-10-CM | POA: Diagnosis not present

## 2019-03-23 DIAGNOSIS — N182 Chronic kidney disease, stage 2 (mild): Secondary | ICD-10-CM | POA: Diagnosis not present

## 2019-03-23 DIAGNOSIS — M109 Gout, unspecified: Secondary | ICD-10-CM | POA: Diagnosis not present

## 2019-05-12 DIAGNOSIS — N401 Enlarged prostate with lower urinary tract symptoms: Secondary | ICD-10-CM | POA: Diagnosis not present

## 2019-05-12 DIAGNOSIS — R3912 Poor urinary stream: Secondary | ICD-10-CM | POA: Diagnosis not present

## 2019-05-23 ENCOUNTER — Encounter: Payer: Self-pay | Admitting: Cardiology

## 2019-05-23 ENCOUNTER — Telehealth (INDEPENDENT_AMBULATORY_CARE_PROVIDER_SITE_OTHER): Payer: Medicare Other | Admitting: Cardiology

## 2019-05-23 VITALS — BP 137/62 | HR 54 | Ht 68.0 in

## 2019-05-23 DIAGNOSIS — I2510 Atherosclerotic heart disease of native coronary artery without angina pectoris: Secondary | ICD-10-CM | POA: Diagnosis not present

## 2019-05-23 DIAGNOSIS — I10 Essential (primary) hypertension: Secondary | ICD-10-CM | POA: Diagnosis not present

## 2019-05-23 DIAGNOSIS — M1990 Unspecified osteoarthritis, unspecified site: Secondary | ICD-10-CM | POA: Diagnosis not present

## 2019-05-23 DIAGNOSIS — I6529 Occlusion and stenosis of unspecified carotid artery: Secondary | ICD-10-CM

## 2019-05-23 DIAGNOSIS — E785 Hyperlipidemia, unspecified: Secondary | ICD-10-CM

## 2019-05-23 NOTE — Patient Instructions (Signed)
Medication Instructions:  Your physician recommends that you continue on your current medications as directed. Please refer to the Current Medication list given to you today.  *If you need a refill on your cardiac medications before your next appointment, please call your pharmacy*   Follow-Up: At [facility], you and your health needs are our priority.  As part of our continuing mission to provide you with exceptional heart care, we have created designated Provider Care Teams.  These Care Teams include your primary Cardiologist (physician) and Advanced Practice Providers (APPs -  Physician Assistants and Nurse Practitioners) who all work together to provide you with the care you need, when you need it.  We recommend signing up for the patient portal called "MyChart".  Sign up information is provided on this After Visit Summary.  MyChart is used to connect with patients for Virtual Visits (Telemedicine).  Patients are able to view lab/test results, encounter notes, upcoming appointments, etc.  Non-urgent messages can be sent to your provider as well.   To learn more about what you can do with MyChart, go to [email or web address].    Your next appointment:   12 month(s)  The format for your next appointment:   In Person  Provider:   Rollene Rotunda, MD   Other Instructions Please call our office 2 months in advance to schedule your follow-up appointment.

## 2019-05-23 NOTE — Progress Notes (Signed)
Virtual Visit via Telephone Note   This visit type was conducted due to national recommendations for restrictions regarding the COVID-19 Pandemic (e.g. social distancing) in an effort to limit this patient's exposure and mitigate transmission in our community.  Due to his co-morbid illnesses, this patient is at least at moderate risk for complications without adequate follow up.  This format is felt to be most appropriate for this patient at this time.  The patient did not have access to video technology/had technical difficulties with video requiring transitioning to audio format only (telephone).  All issues noted in this document were discussed and addressed.  No physical exam could be performed with this format.  Please refer to the patient's chart for his  consent to telehealth for [facility].   The patient was identified using 2 identifiers.  Date:  05/23/2019   ID:  [name], DOB Sep 29, 1930, MRN [id number]  Patient Location: Home Provider Location: Home  PCP:  Tisovec, Adelfa Koh, MD  Cardiologist:  Dr Antoine Poche Electrophysiologist:  None   Evaluation Performed:  Follow-Up Visit  Chief Complaint:  none  History of Present Illness:    [name] is a 86 y.o. male with a history of a remote RCA occlusion documented in [place] in the 49s.  In 2012 he had another angiogram and had an OM DES placed.  He has done well since from a cardiac standpoint.  Other medical issues include essential hypertension, dyslipidemia, and BPH.  He was contacted today for routine annual follow-up.  The patient says he has done well since he saw Dr. Antoine Poche last.  He is not had chest pain or unusual shortness of breath.  He did tell me that his activity is pretty limited now secondary to DJD in his knees.  He is not currently considering a knee replacement.  He has done well during the pandemic, he and his wife both have had their vaccines.  His primary care provider follows his lipids.  The patient  does not have symptoms concerning for COVID-19 infection (fever, chills, cough, or new shortness of breath).    Past Medical History:  Diagnosis Date  . Acute GI bleeding 10/30/2011  . Anginal pain (HCC) 12/2010  . 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 BPH (benign prostatic hyperplasia)   . CAD (coronary artery disease)    RCA occluded (Cath [place] 1980s);  LHC 12/22/10: dLM 30%, prox to mid LAD 30%, oOM 95%, AV branches supplied collats to PDA, RCA occluded, EF 65%.  He underwent placement of a Promus DES to the OM.  Marland Kitchen Carotid stenosis    0 - 39% bilateral  . Diverticulosis   . Exertional dyspnea 10/30/2011  . GERD (gastroesophageal reflux disease)   . Glaucoma   . Gout   . Hyperlipidemia   . Hypertension   . Pneumonia 1950's   Past Surgical History:  Procedure Laterality Date  . COLONOSCOPY    . CORONARY ANGIOPLASTY WITH STENT PLACEMENT  12/2010   "1"  . CYSTECTOMY     right cheek  . EYE MUSCLE SURGERY  1953   right  . TRANSURETHRAL RESECTION OF PROSTATE  ~ 2007     Current Meds  Medication Sig  . allopurinol (ZYLOPRIM) 300 MG tablet Take 300 mg by mouth Daily.   Marland Kitchen aspirin 81 MG tablet Take 81 mg by mouth daily.  Marland Kitchen docusate sodium (COLACE) 100 MG capsule Take 100 mg by mouth daily as needed. For  constipation  . finasteride (PROSCAR) 5 MG tablet Take 5 mg by mouth Daily.   . hydrochlorothiazide (HYDRODIURIL) 25 MG tablet Take 12.5-25 mg by mouth daily.  Marland Kitchen ibuprofen (ADVIL,MOTRIN) 200 MG tablet Take 200 mg by mouth every 6 (six) hours as needed for pain.  Marland Kitchen latanoprost (XALATAN) 0.005 % ophthalmic solution Place 1 drop into the left eye at bedtime.  Marland Kitchen lisinopril (PRINIVIL,ZESTRIL) 40 MG tablet Take 1 tablet by mouth Daily.   . metoprolol tartrate (LOPRESSOR) 25 MG tablet TAKE 1 BY MOUTH TWICE DAILY  . nitroGLYCERIN (NITROSTAT) 0.4 MG SL tablet Place 1 tablet (0.4 mg total) under the tongue every 5 (five) minutes as needed.  . ranitidine (ZANTAC)  150 MG tablet Take 150 mg by mouth daily as needed. For heartburn  . simvastatin (ZOCOR) 80 MG tablet Take 80 mg by mouth at bedtime.  . Tamsulosin HCl (FLOMAX) 0.4 MG CAPS Take 0.4 mg by mouth Daily.   . timolol (TIMOPTIC) 0.5 % ophthalmic solution Place 1 drop into the left eye 2 (two) times daily.     Allergies:   Patient has no known allergies.   Social History   Tobacco Use  . Smoking status: Former Smoker    Packs/day: 3.00    Years: 14.00    Pack years: 42.00    Types: Cigarettes    Quit date: 12/12/1966    Years since quitting: 52.4  . Smokeless tobacco: Never Used  Substance Use Topics  . Alcohol use: Yes    Comment: 10/30/2011 "stopped all alcohol 2007; used to drink too much"  . Drug use: No     Family Hx: The patient's family history includes Cancer in his mother; Prostate cancer (age of onset: 56) in his father. There is no history of Colon cancer.  ROS:   Please see the history of present illness.    All other systems reviewed and are negative.   Prior CV studies:   The following studies were reviewed today:   Labs/Other Tests and Data Reviewed:    EKG:  An ECG dated 04/2018 was personally reviewed today and demonstrated:  NSR-SB 56, 1st AVB, LVH  Recent Labs: No results found for requested labs within last 8760 hours.   Recent Lipid Panel No results found for: CHOL, TRIG, HDL, CHOLHDL, LDLCALC, LDLDIRECT  Wt Readings from Last 3 Encounters:  04/28/18 185 lb 9.6 oz (84.2 kg)  04/13/17 180 lb (81.6 kg)  04/07/16 177 lb 6.4 oz (80.5 kg)     Objective:    Vital Signs:  BP 137/62   Pulse (!) 54   Ht 5\' 8"  (1.727 m)   BMI 28.22 kg/m    VITAL SIGNS:  reviewed  ASSESSMENT & PLAN:    CAD- Remote documented occlusion of RCA S/P OM DES 2012  HTN- Controlled.  He has LVH on his last EKG  HLD- Followed by PCP  DJD- Activity limited by DJD in his knees  Plan: Same Rx- f/u one year with Dr 2013  COVID-19 Education: The signs and  symptoms of COVID-19 were discussed with the patient and how to seek care for testing (follow up with PCP or arrange E-visit).  The importance of social distancing was discussed today.  Time:   Today, I have spent 10 minutes with the patient with telehealth technology discussing the above problems.     Medication Adjustments/Labs and Tests Ordered: Current medicines are reviewed at length with the patient today.  Concerns regarding medicines are outlined above.  Tests Ordered: No orders of the defined types were placed in this encounter.   Medication Changes: No orders of the defined types were placed in this encounter.   Follow Up:  In Person one year with Dr Percival Spanish  Signed, Kerin Ransom, PA-C  05/23/2019 11:12 AM    [place]

## 2019-06-11 DIAGNOSIS — H524 Presbyopia: Secondary | ICD-10-CM | POA: Diagnosis not present

## 2019-08-15 DIAGNOSIS — H40002 Preglaucoma, unspecified, left eye: Secondary | ICD-10-CM | POA: Diagnosis not present

## 2019-09-19 DIAGNOSIS — E7800 Pure hypercholesterolemia, unspecified: Secondary | ICD-10-CM | POA: Diagnosis not present

## 2019-09-19 DIAGNOSIS — I10 Essential (primary) hypertension: Secondary | ICD-10-CM | POA: Diagnosis not present

## 2019-09-19 DIAGNOSIS — Z0000 Encounter for general adult medical examination without abnormal findings: Secondary | ICD-10-CM | POA: Diagnosis not present

## 2019-09-19 DIAGNOSIS — R7302 Impaired glucose tolerance (oral): Secondary | ICD-10-CM | POA: Diagnosis not present

## 2019-09-26 DIAGNOSIS — Z0000 Encounter for general adult medical examination without abnormal findings: Secondary | ICD-10-CM | POA: Diagnosis not present

## 2019-09-26 DIAGNOSIS — N182 Chronic kidney disease, stage 2 (mild): Secondary | ICD-10-CM | POA: Diagnosis not present

## 2019-09-26 DIAGNOSIS — R809 Proteinuria, unspecified: Secondary | ICD-10-CM | POA: Diagnosis not present

## 2019-09-26 DIAGNOSIS — R82998 Other abnormal findings in urine: Secondary | ICD-10-CM | POA: Diagnosis not present

## 2019-09-26 DIAGNOSIS — I1310 Hypertensive heart and chronic kidney disease without heart failure, with stage 1 through stage 4 chronic kidney disease, or unspecified chronic kidney disease: Secondary | ICD-10-CM | POA: Diagnosis not present

## 2019-11-07 ENCOUNTER — Encounter: Payer: Self-pay | Admitting: Podiatry

## 2019-11-07 ENCOUNTER — Other Ambulatory Visit: Payer: Self-pay

## 2019-11-07 ENCOUNTER — Ambulatory Visit (INDEPENDENT_AMBULATORY_CARE_PROVIDER_SITE_OTHER): Payer: Medicare Other | Admitting: Podiatry

## 2019-11-07 DIAGNOSIS — B351 Tinea unguium: Secondary | ICD-10-CM | POA: Diagnosis not present

## 2019-11-07 DIAGNOSIS — M79675 Pain in left toe(s): Secondary | ICD-10-CM | POA: Diagnosis not present

## 2019-11-07 DIAGNOSIS — M79674 Pain in right toe(s): Secondary | ICD-10-CM | POA: Diagnosis not present

## 2019-11-07 NOTE — Progress Notes (Signed)
This patient presents  to the office for evaluation and treatment of long thick painful nails .  This patient is unable to trim his own nails since the patient cannot reach his feet.  Patient says the nails are painful walking and wearing his shoes.  He returns for preventive foot care services.  General Appearance  Alert, conversant and in no acute stress.  Vascular  Dorsalis pedis and posterior tibial  pulses are palpable  bilaterally.  Capillary return is within normal limits  bilaterally. Temperature is within normal limits  bilaterally.  Neurologic  Senn-Weinstein monofilament wire test within normal limits  bilaterally. Muscle power within normal limits bilaterally.  Nails Thick disfigured discolored nails with subungual debris  from hallux to fifth toes bilaterally. No evidence of bacterial infection or drainage bilaterally.  Orthopedic  No limitations of motion  feet .  No crepitus or effusions noted.  No bony pathology or digital deformities noted.  Toes 2-5 left are medially deviated.  Skin  normotropic skin with no porokeratosis noted bilaterally.  No signs of infections or ulcers noted.     Onychomycosis  Pain in toes right foot  Pain in toes left foot  Debridement  of nails  1-5  B/L with a nail nipper.  Nails were then filed using a dremel tool with no incidents.    RTC 3 months    Helane Gunther DPM

## 2019-11-20 DIAGNOSIS — H16222 Keratoconjunctivitis sicca, not specified as Sjogren's, left eye: Secondary | ICD-10-CM | POA: Diagnosis not present

## 2019-11-20 DIAGNOSIS — H40002 Preglaucoma, unspecified, left eye: Secondary | ICD-10-CM | POA: Diagnosis not present

## 2019-12-09 DIAGNOSIS — Z23 Encounter for immunization: Secondary | ICD-10-CM | POA: Diagnosis not present

## 2020-02-13 DIAGNOSIS — H40002 Preglaucoma, unspecified, left eye: Secondary | ICD-10-CM | POA: Diagnosis not present

## 2020-02-13 DIAGNOSIS — H44521 Atrophy of globe, right eye: Secondary | ICD-10-CM | POA: Diagnosis not present

## 2020-03-27 DIAGNOSIS — E7800 Pure hypercholesterolemia, unspecified: Secondary | ICD-10-CM | POA: Diagnosis not present

## 2020-03-27 DIAGNOSIS — E871 Hypo-osmolality and hyponatremia: Secondary | ICD-10-CM | POA: Diagnosis not present

## 2020-03-27 DIAGNOSIS — I209 Angina pectoris, unspecified: Secondary | ICD-10-CM | POA: Diagnosis not present

## 2020-03-27 DIAGNOSIS — R7302 Impaired glucose tolerance (oral): Secondary | ICD-10-CM | POA: Diagnosis not present

## 2020-03-27 DIAGNOSIS — I1310 Hypertensive heart and chronic kidney disease without heart failure, with stage 1 through stage 4 chronic kidney disease, or unspecified chronic kidney disease: Secondary | ICD-10-CM | POA: Diagnosis not present

## 2020-05-29 DIAGNOSIS — R3912 Poor urinary stream: Secondary | ICD-10-CM | POA: Diagnosis not present

## 2020-05-29 DIAGNOSIS — N401 Enlarged prostate with lower urinary tract symptoms: Secondary | ICD-10-CM | POA: Diagnosis not present

## 2020-08-19 DIAGNOSIS — H40002 Preglaucoma, unspecified, left eye: Secondary | ICD-10-CM | POA: Diagnosis not present

## 2020-08-19 DIAGNOSIS — H44521 Atrophy of globe, right eye: Secondary | ICD-10-CM | POA: Diagnosis not present

## 2020-09-23 DIAGNOSIS — Z125 Encounter for screening for malignant neoplasm of prostate: Secondary | ICD-10-CM | POA: Diagnosis not present

## 2020-09-23 DIAGNOSIS — M109 Gout, unspecified: Secondary | ICD-10-CM | POA: Diagnosis not present

## 2020-09-23 DIAGNOSIS — E7800 Pure hypercholesterolemia, unspecified: Secondary | ICD-10-CM | POA: Diagnosis not present

## 2020-09-23 DIAGNOSIS — R7302 Impaired glucose tolerance (oral): Secondary | ICD-10-CM | POA: Diagnosis not present

## 2020-09-30 DIAGNOSIS — R82998 Other abnormal findings in urine: Secondary | ICD-10-CM | POA: Diagnosis not present

## 2020-09-30 DIAGNOSIS — I1310 Hypertensive heart and chronic kidney disease without heart failure, with stage 1 through stage 4 chronic kidney disease, or unspecified chronic kidney disease: Secondary | ICD-10-CM | POA: Diagnosis not present

## 2021-02-18 DIAGNOSIS — H40002 Preglaucoma, unspecified, left eye: Secondary | ICD-10-CM | POA: Diagnosis not present

## 2021-02-18 DIAGNOSIS — H44521 Atrophy of globe, right eye: Secondary | ICD-10-CM | POA: Diagnosis not present

## 2021-04-03 DIAGNOSIS — R7302 Impaired glucose tolerance (oral): Secondary | ICD-10-CM | POA: Diagnosis not present

## 2021-04-03 DIAGNOSIS — E7800 Pure hypercholesterolemia, unspecified: Secondary | ICD-10-CM | POA: Diagnosis not present

## 2021-04-03 DIAGNOSIS — I1310 Hypertensive heart and chronic kidney disease without heart failure, with stage 1 through stage 4 chronic kidney disease, or unspecified chronic kidney disease: Secondary | ICD-10-CM | POA: Diagnosis not present

## 2021-04-03 DIAGNOSIS — I209 Angina pectoris, unspecified: Secondary | ICD-10-CM | POA: Diagnosis not present

## 2021-09-23 DIAGNOSIS — H4089 Other specified glaucoma: Secondary | ICD-10-CM | POA: Diagnosis not present

## 2021-09-23 DIAGNOSIS — H44521 Atrophy of globe, right eye: Secondary | ICD-10-CM | POA: Diagnosis not present

## 2021-09-29 DIAGNOSIS — E7800 Pure hypercholesterolemia, unspecified: Secondary | ICD-10-CM | POA: Diagnosis not present

## 2021-09-29 DIAGNOSIS — R7302 Impaired glucose tolerance (oral): Secondary | ICD-10-CM | POA: Diagnosis not present

## 2021-09-29 DIAGNOSIS — Z125 Encounter for screening for malignant neoplasm of prostate: Secondary | ICD-10-CM | POA: Diagnosis not present

## 2021-09-29 DIAGNOSIS — M109 Gout, unspecified: Secondary | ICD-10-CM | POA: Diagnosis not present

## 2021-10-06 DIAGNOSIS — R82998 Other abnormal findings in urine: Secondary | ICD-10-CM | POA: Diagnosis not present

## 2021-10-06 DIAGNOSIS — Z1339 Encounter for screening examination for other mental health and behavioral disorders: Secondary | ICD-10-CM | POA: Diagnosis not present

## 2021-10-06 DIAGNOSIS — R7302 Impaired glucose tolerance (oral): Secondary | ICD-10-CM | POA: Diagnosis not present

## 2021-10-06 DIAGNOSIS — Z0000 Encounter for general adult medical examination without abnormal findings: Secondary | ICD-10-CM | POA: Diagnosis not present

## 2021-10-06 DIAGNOSIS — Z1331 Encounter for screening for depression: Secondary | ICD-10-CM | POA: Diagnosis not present

## 2021-10-06 DIAGNOSIS — I209 Angina pectoris, unspecified: Secondary | ICD-10-CM | POA: Diagnosis not present

## 2021-10-06 DIAGNOSIS — I1310 Hypertensive heart and chronic kidney disease without heart failure, with stage 1 through stage 4 chronic kidney disease, or unspecified chronic kidney disease: Secondary | ICD-10-CM | POA: Diagnosis not present

## 2021-12-13 DIAGNOSIS — Z23 Encounter for immunization: Secondary | ICD-10-CM | POA: Diagnosis not present

## 2022-03-30 DIAGNOSIS — H4089 Other specified glaucoma: Secondary | ICD-10-CM | POA: Diagnosis not present

## 2022-04-15 DIAGNOSIS — N182 Chronic kidney disease, stage 2 (mild): Secondary | ICD-10-CM | POA: Diagnosis not present

## 2022-04-15 DIAGNOSIS — I129 Hypertensive chronic kidney disease with stage 1 through stage 4 chronic kidney disease, or unspecified chronic kidney disease: Secondary | ICD-10-CM | POA: Diagnosis not present

## 2022-04-15 DIAGNOSIS — I209 Angina pectoris, unspecified: Secondary | ICD-10-CM | POA: Diagnosis not present

## 2022-04-15 DIAGNOSIS — I6529 Occlusion and stenosis of unspecified carotid artery: Secondary | ICD-10-CM | POA: Diagnosis not present

## 2022-11-03 DIAGNOSIS — N182 Chronic kidney disease, stage 2 (mild): Secondary | ICD-10-CM | POA: Diagnosis not present

## 2022-11-03 DIAGNOSIS — E785 Hyperlipidemia, unspecified: Secondary | ICD-10-CM | POA: Diagnosis not present

## 2022-11-03 DIAGNOSIS — R7302 Impaired glucose tolerance (oral): Secondary | ICD-10-CM | POA: Diagnosis not present

## 2022-11-03 DIAGNOSIS — N401 Enlarged prostate with lower urinary tract symptoms: Secondary | ICD-10-CM | POA: Diagnosis not present

## 2022-11-03 DIAGNOSIS — M109 Gout, unspecified: Secondary | ICD-10-CM | POA: Diagnosis not present

## 2022-11-03 DIAGNOSIS — E7800 Pure hypercholesterolemia, unspecified: Secondary | ICD-10-CM | POA: Diagnosis not present

## 2022-11-03 DIAGNOSIS — D631 Anemia in chronic kidney disease: Secondary | ICD-10-CM | POA: Diagnosis not present

## 2022-11-09 DIAGNOSIS — I209 Angina pectoris, unspecified: Secondary | ICD-10-CM | POA: Diagnosis not present

## 2022-11-09 DIAGNOSIS — R82998 Other abnormal findings in urine: Secondary | ICD-10-CM | POA: Diagnosis not present

## 2022-11-09 DIAGNOSIS — Z1331 Encounter for screening for depression: Secondary | ICD-10-CM | POA: Diagnosis not present

## 2022-11-09 DIAGNOSIS — Z0000 Encounter for general adult medical examination without abnormal findings: Secondary | ICD-10-CM | POA: Diagnosis not present

## 2022-11-09 DIAGNOSIS — Z23 Encounter for immunization: Secondary | ICD-10-CM | POA: Diagnosis not present

## 2022-11-09 DIAGNOSIS — I129 Hypertensive chronic kidney disease with stage 1 through stage 4 chronic kidney disease, or unspecified chronic kidney disease: Secondary | ICD-10-CM | POA: Diagnosis not present

## 2022-11-09 DIAGNOSIS — Z1339 Encounter for screening examination for other mental health and behavioral disorders: Secondary | ICD-10-CM | POA: Diagnosis not present

## 2023-05-12 DIAGNOSIS — I1310 Hypertensive heart and chronic kidney disease without heart failure, with stage 1 through stage 4 chronic kidney disease, or unspecified chronic kidney disease: Secondary | ICD-10-CM | POA: Diagnosis not present

## 2023-05-12 DIAGNOSIS — I129 Hypertensive chronic kidney disease with stage 1 through stage 4 chronic kidney disease, or unspecified chronic kidney disease: Secondary | ICD-10-CM | POA: Diagnosis not present

## 2023-05-12 DIAGNOSIS — R7302 Impaired glucose tolerance (oral): Secondary | ICD-10-CM | POA: Diagnosis not present

## 2023-05-31 ENCOUNTER — Emergency Department (HOSPITAL_COMMUNITY)

## 2023-05-31 ENCOUNTER — Encounter (HOSPITAL_COMMUNITY): Payer: Self-pay

## 2023-05-31 ENCOUNTER — Inpatient Hospital Stay (HOSPITAL_COMMUNITY)
Admission: EM | Admit: 2023-05-31 | Discharge: 2023-06-08 | DRG: 193 | Disposition: A | Discharge status: Skilled Nursing Facility | Attending: Internal Medicine | Admitting: Internal Medicine

## 2023-05-31 ENCOUNTER — Other Ambulatory Visit: Payer: Self-pay

## 2023-05-31 DIAGNOSIS — Z888 Allergy status to other drugs, medicaments and biological substances status: Secondary | ICD-10-CM

## 2023-05-31 DIAGNOSIS — K921 Melena: Secondary | ICD-10-CM | POA: Diagnosis not present

## 2023-05-31 DIAGNOSIS — M109 Gout, unspecified: Secondary | ICD-10-CM | POA: Diagnosis present

## 2023-05-31 DIAGNOSIS — G928 Other toxic encephalopathy: Secondary | ICD-10-CM | POA: Diagnosis not present

## 2023-05-31 DIAGNOSIS — J111 Influenza due to unidentified influenza virus with other respiratory manifestations: Secondary | ICD-10-CM

## 2023-05-31 DIAGNOSIS — G9341 Metabolic encephalopathy: Secondary | ICD-10-CM | POA: Diagnosis not present

## 2023-05-31 DIAGNOSIS — K222 Esophageal obstruction: Secondary | ICD-10-CM | POA: Diagnosis present

## 2023-05-31 DIAGNOSIS — R7401 Elevation of levels of liver transaminase levels: Secondary | ICD-10-CM | POA: Diagnosis not present

## 2023-05-31 DIAGNOSIS — I4819 Other persistent atrial fibrillation: Secondary | ICD-10-CM | POA: Diagnosis present

## 2023-05-31 DIAGNOSIS — Z1152 Encounter for screening for COVID-19: Secondary | ICD-10-CM | POA: Diagnosis not present

## 2023-05-31 DIAGNOSIS — W010XXA Fall on same level from slipping, tripping and stumbling without subsequent striking against object, initial encounter: Secondary | ICD-10-CM | POA: Diagnosis present

## 2023-05-31 DIAGNOSIS — W19XXXA Unspecified fall, initial encounter: Secondary | ICD-10-CM | POA: Diagnosis not present

## 2023-05-31 DIAGNOSIS — I2489 Other forms of acute ischemic heart disease: Secondary | ICD-10-CM | POA: Diagnosis present

## 2023-05-31 DIAGNOSIS — Z79899 Other long term (current) drug therapy: Secondary | ICD-10-CM

## 2023-05-31 DIAGNOSIS — E876 Hypokalemia: Secondary | ICD-10-CM | POA: Diagnosis not present

## 2023-05-31 DIAGNOSIS — I2510 Atherosclerotic heart disease of native coronary artery without angina pectoris: Secondary | ICD-10-CM | POA: Diagnosis not present

## 2023-05-31 DIAGNOSIS — I129 Hypertensive chronic kidney disease with stage 1 through stage 4 chronic kidney disease, or unspecified chronic kidney disease: Secondary | ICD-10-CM | POA: Diagnosis not present

## 2023-05-31 DIAGNOSIS — M5030 Other cervical disc degeneration, unspecified cervical region: Secondary | ICD-10-CM | POA: Diagnosis not present

## 2023-05-31 DIAGNOSIS — N400 Enlarged prostate without lower urinary tract symptoms: Secondary | ICD-10-CM | POA: Diagnosis present

## 2023-05-31 DIAGNOSIS — K920 Hematemesis: Secondary | ICD-10-CM

## 2023-05-31 DIAGNOSIS — K264 Chronic or unspecified duodenal ulcer with hemorrhage: Secondary | ICD-10-CM | POA: Diagnosis not present

## 2023-05-31 DIAGNOSIS — D696 Thrombocytopenia, unspecified: Secondary | ICD-10-CM | POA: Diagnosis not present

## 2023-05-31 DIAGNOSIS — Y92009 Unspecified place in unspecified non-institutional (private) residence as the place of occurrence of the external cause: Secondary | ICD-10-CM

## 2023-05-31 DIAGNOSIS — Z860100 Personal history of colon polyps, unspecified: Secondary | ICD-10-CM

## 2023-05-31 DIAGNOSIS — Z0389 Encounter for observation for other suspected diseases and conditions ruled out: Secondary | ICD-10-CM | POA: Diagnosis not present

## 2023-05-31 DIAGNOSIS — K449 Diaphragmatic hernia without obstruction or gangrene: Secondary | ICD-10-CM | POA: Diagnosis present

## 2023-05-31 DIAGNOSIS — M6281 Muscle weakness (generalized): Secondary | ICD-10-CM | POA: Diagnosis not present

## 2023-05-31 DIAGNOSIS — I160 Hypertensive urgency: Secondary | ICD-10-CM | POA: Diagnosis present

## 2023-05-31 DIAGNOSIS — Z791 Long term (current) use of non-steroidal anti-inflammatories (NSAID): Secondary | ICD-10-CM

## 2023-05-31 DIAGNOSIS — I6529 Occlusion and stenosis of unspecified carotid artery: Secondary | ICD-10-CM | POA: Diagnosis present

## 2023-05-31 DIAGNOSIS — E785 Hyperlipidemia, unspecified: Secondary | ICD-10-CM | POA: Diagnosis not present

## 2023-05-31 DIAGNOSIS — R9389 Abnormal findings on diagnostic imaging of other specified body structures: Secondary | ICD-10-CM | POA: Diagnosis not present

## 2023-05-31 DIAGNOSIS — E871 Hypo-osmolality and hyponatremia: Secondary | ICD-10-CM | POA: Diagnosis not present

## 2023-05-31 DIAGNOSIS — M160 Bilateral primary osteoarthritis of hip: Secondary | ICD-10-CM | POA: Diagnosis not present

## 2023-05-31 DIAGNOSIS — S80919A Unspecified superficial injury of unspecified knee, initial encounter: Secondary | ICD-10-CM | POA: Diagnosis not present

## 2023-05-31 DIAGNOSIS — K219 Gastro-esophageal reflux disease without esophagitis: Secondary | ICD-10-CM | POA: Diagnosis not present

## 2023-05-31 DIAGNOSIS — S0990XA Unspecified injury of head, initial encounter: Secondary | ICD-10-CM | POA: Diagnosis not present

## 2023-05-31 DIAGNOSIS — Z87891 Personal history of nicotine dependence: Secondary | ICD-10-CM

## 2023-05-31 DIAGNOSIS — R41841 Cognitive communication deficit: Secondary | ICD-10-CM | POA: Diagnosis not present

## 2023-05-31 DIAGNOSIS — Z043 Encounter for examination and observation following other accident: Secondary | ICD-10-CM | POA: Diagnosis not present

## 2023-05-31 DIAGNOSIS — J209 Acute bronchitis, unspecified: Secondary | ICD-10-CM | POA: Insufficient documentation

## 2023-05-31 DIAGNOSIS — I480 Paroxysmal atrial fibrillation: Secondary | ICD-10-CM | POA: Diagnosis present

## 2023-05-31 DIAGNOSIS — T4145XA Adverse effect of unspecified anesthetic, initial encounter: Secondary | ICD-10-CM | POA: Diagnosis present

## 2023-05-31 DIAGNOSIS — R262 Difficulty in walking, not elsewhere classified: Secondary | ICD-10-CM | POA: Diagnosis not present

## 2023-05-31 DIAGNOSIS — M47816 Spondylosis without myelopathy or radiculopathy, lumbar region: Secondary | ICD-10-CM | POA: Diagnosis not present

## 2023-05-31 DIAGNOSIS — Z7982 Long term (current) use of aspirin: Secondary | ICD-10-CM

## 2023-05-31 DIAGNOSIS — J189 Pneumonia, unspecified organism: Secondary | ICD-10-CM | POA: Diagnosis not present

## 2023-05-31 DIAGNOSIS — R531 Weakness: Secondary | ICD-10-CM | POA: Diagnosis not present

## 2023-05-31 DIAGNOSIS — I10 Essential (primary) hypertension: Secondary | ICD-10-CM | POA: Diagnosis present

## 2023-05-31 DIAGNOSIS — R7989 Other specified abnormal findings of blood chemistry: Secondary | ICD-10-CM | POA: Diagnosis not present

## 2023-05-31 DIAGNOSIS — D539 Nutritional anemia, unspecified: Secondary | ICD-10-CM | POA: Diagnosis present

## 2023-05-31 DIAGNOSIS — K269 Duodenal ulcer, unspecified as acute or chronic, without hemorrhage or perforation: Secondary | ICD-10-CM

## 2023-05-31 DIAGNOSIS — N179 Acute kidney failure, unspecified: Secondary | ICD-10-CM | POA: Diagnosis present

## 2023-05-31 DIAGNOSIS — H40212 Acute angle-closure glaucoma, left eye: Secondary | ICD-10-CM | POA: Diagnosis not present

## 2023-05-31 DIAGNOSIS — T39395A Adverse effect of other nonsteroidal anti-inflammatory drugs [NSAID], initial encounter: Secondary | ICD-10-CM | POA: Diagnosis present

## 2023-05-31 DIAGNOSIS — H409 Unspecified glaucoma: Secondary | ICD-10-CM | POA: Diagnosis present

## 2023-05-31 DIAGNOSIS — K3189 Other diseases of stomach and duodenum: Secondary | ICD-10-CM | POA: Diagnosis not present

## 2023-05-31 DIAGNOSIS — S50312A Abrasion of left elbow, initial encounter: Secondary | ICD-10-CM | POA: Diagnosis present

## 2023-05-31 DIAGNOSIS — Z955 Presence of coronary angioplasty implant and graft: Secondary | ICD-10-CM | POA: Diagnosis not present

## 2023-05-31 DIAGNOSIS — J101 Influenza due to other identified influenza virus with other respiratory manifestations: Principal | ICD-10-CM | POA: Insufficient documentation

## 2023-05-31 DIAGNOSIS — Z9181 History of falling: Secondary | ICD-10-CM

## 2023-05-31 DIAGNOSIS — R296 Repeated falls: Secondary | ICD-10-CM | POA: Diagnosis present

## 2023-05-31 DIAGNOSIS — N1831 Chronic kidney disease, stage 3a: Secondary | ICD-10-CM | POA: Diagnosis not present

## 2023-05-31 DIAGNOSIS — Z7401 Bed confinement status: Secondary | ICD-10-CM | POA: Diagnosis not present

## 2023-05-31 DIAGNOSIS — I4891 Unspecified atrial fibrillation: Secondary | ICD-10-CM

## 2023-05-31 DIAGNOSIS — M7989 Other specified soft tissue disorders: Secondary | ICD-10-CM | POA: Diagnosis not present

## 2023-05-31 DIAGNOSIS — S199XXA Unspecified injury of neck, initial encounter: Secondary | ICD-10-CM | POA: Diagnosis not present

## 2023-05-31 DIAGNOSIS — D62 Acute posthemorrhagic anemia: Secondary | ICD-10-CM | POA: Diagnosis not present

## 2023-05-31 LAB — CBC WITH DIFFERENTIAL/PLATELET
Abs Immature Granulocytes: 0.03 10*3/uL (ref 0.00–0.07)
Basophils Absolute: 0 10*3/uL (ref 0.0–0.1)
Basophils Relative: 0 %
Eosinophils Absolute: 0 10*3/uL (ref 0.0–0.5)
Eosinophils Relative: 0 %
HCT: 36 % — ABNORMAL LOW (ref 39.0–52.0)
Hemoglobin: 11.5 g/dL — ABNORMAL LOW (ref 13.0–17.0)
Immature Granulocytes: 0 %
Lymphocytes Relative: 8 %
Lymphs Abs: 0.6 10*3/uL — ABNORMAL LOW (ref 0.7–4.0)
MCH: 31.5 pg (ref 26.0–34.0)
MCHC: 31.9 g/dL (ref 30.0–36.0)
MCV: 98.6 fL (ref 80.0–100.0)
Monocytes Absolute: 1.2 10*3/uL — ABNORMAL HIGH (ref 0.1–1.0)
Monocytes Relative: 16 %
Neutro Abs: 5.5 10*3/uL (ref 1.7–7.7)
Neutrophils Relative %: 76 %
Platelets: 138 10*3/uL — ABNORMAL LOW (ref 150–400)
RBC: 3.65 MIL/uL — ABNORMAL LOW (ref 4.22–5.81)
RDW: 14.9 % (ref 11.5–15.5)
WBC: 7.3 10*3/uL (ref 4.0–10.5)
nRBC: 0 % (ref 0.0–0.2)

## 2023-05-31 LAB — COMPREHENSIVE METABOLIC PANEL WITH GFR
ALT: 16 U/L (ref 0–44)
AST: 43 U/L — ABNORMAL HIGH (ref 15–41)
Albumin: 3.5 g/dL (ref 3.5–5.0)
Alkaline Phosphatase: 62 U/L (ref 38–126)
Anion gap: 10 (ref 5–15)
BUN: 29 mg/dL — ABNORMAL HIGH (ref 8–23)
CO2: 25 mmol/L (ref 22–32)
Calcium: 9.3 mg/dL (ref 8.9–10.3)
Chloride: 102 mmol/L (ref 98–111)
Creatinine, Ser: 1.32 mg/dL — ABNORMAL HIGH (ref 0.61–1.24)
GFR, Estimated: 51 mL/min — ABNORMAL LOW (ref >60)
Glucose, Bld: 127 mg/dL — ABNORMAL HIGH (ref 70–99)
Potassium: 3.7 mmol/L (ref 3.5–5.1)
Sodium: 137 mmol/L (ref 135–145)
Total Bilirubin: 0.8 mg/dL (ref 0.0–1.2)
Total Protein: 6.2 g/dL — ABNORMAL LOW (ref 6.5–8.1)

## 2023-05-31 LAB — URINALYSIS, W/ REFLEX TO CULTURE (INFECTION SUSPECTED)
Bacteria, UA: NONE SEEN
Bilirubin Urine: NEGATIVE
Glucose, UA: NEGATIVE mg/dL
Ketones, ur: 5 mg/dL — AB
Leukocytes,Ua: NEGATIVE
Nitrite: NEGATIVE
Protein, ur: >=300 mg/dL — AB
Specific Gravity, Urine: 1.016 (ref 1.005–1.030)
pH: 5 (ref 5.0–8.0)

## 2023-05-31 LAB — RESP PANEL BY RT-PCR (RSV, FLU A&B, COVID)  RVPGX2
Influenza A by PCR: POSITIVE — AB
Influenza B by PCR: NEGATIVE
Resp Syncytial Virus by PCR: NEGATIVE
SARS Coronavirus 2 by RT PCR: NEGATIVE

## 2023-05-31 LAB — TROPONIN I (HIGH SENSITIVITY)
Troponin I (High Sensitivity): 100 ng/L (ref <18)
Troponin I (High Sensitivity): 135 ng/L (ref <18)

## 2023-05-31 LAB — I-STAT CG4 LACTIC ACID, ED: Lactic Acid, Venous: 1.6 mmol/L (ref 0.5–1.9)

## 2023-05-31 LAB — MAGNESIUM: Magnesium: 1.7 mg/dL (ref 1.7–2.4)

## 2023-05-31 MED ORDER — ACETAMINOPHEN 325 MG PO TABS
650.0000 mg | ORAL_TABLET | Freq: Once | ORAL | Status: AC
  Administered 2023-05-31: 650 mg via ORAL
  Filled 2023-05-31: qty 2

## 2023-05-31 MED ORDER — LACTATED RINGERS IV BOLUS
1000.0000 mL | Freq: Once | INTRAVENOUS | Status: AC
  Administered 2023-05-31: 1000 mL via INTRAVENOUS

## 2023-05-31 MED ORDER — LACTATED RINGERS IV BOLUS (SEPSIS)
500.0000 mL | Freq: Once | INTRAVENOUS | Status: AC
  Administered 2023-05-31: 500 mL via INTRAVENOUS

## 2023-05-31 NOTE — ED Notes (Signed)
Pt refuses to keep cardiac monitor on.  

## 2023-05-31 NOTE — ED Provider Notes (Signed)
  Physical Exam  BP (!) 163/63   Pulse 74   Temp 99 F (37.2 C)   Resp (!) 22   SpO2 92%   Physical Exam  Procedures  Procedures  ED Course / MDM    Medical Decision Making Amount and/or Complexity of Data Reviewed Labs: ordered. Radiology: ordered.  Risk OTC drugs.   Wardell Honour, assumed care for this patient.  In brief this is a 89 year old male presenting to the emergency room today due to multiple falls.  Daughter lives at bedside helps provide history.  Patient was signed out pending labs.  Patient tested positive for influenza.  Likely contributing to his symptoms, did have a temp of 100.3 upon arrival.  Not requiring any supplemental O2.  My independent review the patient's head CT shows no intracranial hemorrhage.  Patient with a troponin of 100, second 1 is 135.  He has not had had any chest pain, his EKG is nonischemic.  Patient does appear to be in a slow A-fib.  Do not believe patient requires heparinization.  Consulted cardiology to have them evaluate the patient tomorrow.  Will admit to hospitalist.       Anders Simmonds T, DO 05/31/23 1938

## 2023-05-31 NOTE — H&P (Incomplete)
 History and Physical    [name]:[id number] DOB: May 07, 1930 DOA: 05/31/2023  Patient coming from: Home.  Chief Complaint: Falls.  HPI: [name] is a 89 y.o. male with history of CAD status post PCI, hypertension, hyperlipidemia was brought to the ER after patient had a fall.  The exact circumstances of the fall is not known patient is not able to provide a good history.  Unable to reach family.  Patient denies any chest pain shortness of breath nausea vomiting or diarrhea.  ED Course: In the ER patient was hypertensive with systolic more than 200.  Patient had a temperature of 99 F and influenza A test was positive.  CT head C-spine x-ray chest and pelvis were unremarkable.  EKG shows A-fib rate controlled.  Troponin were elevated at 135 and 116.  On exam patient is diffusely wheezing.  Patient was given 1.5 L fluid bolus admitted for further management.  Review of Systems: As per HPI, rest all negative.   Past Medical History:  Diagnosis Date   Acute GI bleeding 10/30/2011   Anginal pain (HCC) 12/2010   Arthritis    "knees and left shoulder is completely shot"   BPH (benign prostatic hyperplasia)    CAD (coronary artery disease)    RCA occluded (Cath [place] 1980s);  LHC 12/22/10: dLM 30%, prox to mid LAD 30%, oOM 95%, AV branches supplied collats to PDA, RCA occluded, EF 65%.  He underwent placement of a Promus DES to the OM.   Carotid stenosis    0 - 39% bilateral   Diverticulosis    Exertional dyspnea 10/30/2011   GERD (gastroesophageal reflux disease)    Glaucoma    Gout    Hyperlipidemia    Hypertension    Pneumonia 1950's    Past Surgical History:  Procedure Laterality Date   COLONOSCOPY     CORONARY ANGIOPLASTY WITH STENT PLACEMENT  12/2010   "1"   CYSTECTOMY     right cheek   EYE MUSCLE SURGERY  1953   right   TRANSURETHRAL RESECTION OF PROSTATE  ~ 2007     reports that he quit smoking about 56 years ago. His smoking use included cigarettes. He started  smoking about 70 years ago. He has a 42 pack-year smoking history. He has never used smokeless tobacco. He reports current alcohol use. He reports that he does not use drugs.  No Known Allergies  Family History  Problem Relation Age of Onset   Prostate cancer Father 57   Cancer Mother        Stomach   Colon cancer Neg Hx     Prior to Admission medications   Medication Sig Start Date End Date Taking? Authorizing Provider  allopurinol (ZYLOPRIM) 300 MG tablet Take 300 mg by mouth Daily.  09/11/10   [provider]  aspirin 81 MG tablet Take 81 mg by mouth daily.    [provider]  docusate sodium (COLACE) 100 MG capsule Take 100 mg by mouth daily as needed. For constipation    [provider]  finasteride (PROSCAR) 5 MG tablet Take 5 mg by mouth Daily.  09/12/10   [provider]  hydrochlorothiazide (HYDRODIURIL) 25 MG tablet Take 12.5-25 mg by mouth daily.    [provider]  ibuprofen (ADVIL,MOTRIN) 200 MG tablet Take 200 mg by mouth every 6 (six) hours as needed for pain.    [provider]  latanoprost (XALATAN) 0.005 % ophthalmic solution Place 1 drop into the left eye  at bedtime. 04/14/19   [provider]  lisinopril (PRINIVIL,ZESTRIL) 40 MG tablet Take 1 tablet by mouth Daily.     [provider]  metoprolol tartrate (LOPRESSOR) 25 MG tablet TAKE 1 BY MOUTH TWICE DAILY 03/12/15   Rollene Rotunda, MD  nitroGLYCERIN (NITROSTAT) 0.4 MG SL tablet Place 1 tablet (0.4 mg total) under the tongue every 5 (five) minutes as needed. 04/07/16   Rollene Rotunda, MD  ranitidine (ZANTAC) 150 MG tablet Take 150 mg by mouth daily as needed. For heartburn    [provider]  simvastatin (ZOCOR) 80 MG tablet Take 80 mg by mouth at bedtime.    [provider]  Tamsulosin HCl (FLOMAX) 0.4 MG CAPS Take 0.4 mg by mouth Daily.     [provider]  timolol (TIMOPTIC) 0.5 % ophthalmic solution Place 1 drop into the  left eye 2 (two) times daily. 04/05/18   [provider]    Physical Exam: Constitutional: Moderately built and nourished. Vitals:   05/31/23 1724 05/31/23 1745 05/31/23 1800 05/31/23 2110  BP:   (!) 149/70   Pulse:   78   Resp:  (!) 26 (!) 22   Temp: 99 F (37.2 C)   98.2 F (36.8 C)  TempSrc:    Oral  SpO2:   93%    Eyes: Anicteric no pallor. ENMT: No discharge from the ears eyes nose or mouth. Neck: No mass felt.  No neck rigidity. Respiratory: No rhonchi or crepitations. Cardiovascular: S1-S2 heard. Abdomen: Soft nontender bowel sound present. Musculoskeletal: No edema. Skin: No rash. Neurologic: Alert awake oriented to his name and place moving all extremities has some tremors of the lower extremity. Psychiatric: Oriented to name and place.   Labs on Admission: I have personally reviewed following labs and imaging studies  CBC: Recent Labs  Lab 05/31/23 1410  WBC 7.3  NEUTROABS 5.5  HGB 11.5*  HCT 36.0*  MCV 98.6  PLT 138*   Basic Metabolic Panel: Recent Labs  Lab 05/31/23 1410  NA 137  K 3.7  CL 102  CO2 25  GLUCOSE 127*  BUN 29*  CREATININE 1.32*  CALCIUM 9.3  MG 1.7   GFR: CrCl cannot be calculated (Unknown ideal weight.). Liver Function Tests: Recent Labs  Lab 05/31/23 1410  AST 43*  ALT 16  ALKPHOS 62  BILITOT 0.8  PROT 6.2*  ALBUMIN 3.5   No results for input(s): "LIPASE", "AMYLASE" in the last 168 hours. No results for input(s): "AMMONIA" in the last 168 hours. Coagulation Profile: No results for input(s): "INR", "PROTIME" in the last 168 hours. Cardiac Enzymes: No results for input(s): "CKTOTAL", "CKMB", "CKMBINDEX", "TROPONINI" in the last 168 hours. BNP (last 3 results) No results for input(s): "PROBNP" in the last 8760 hours. HbA1C: No results for input(s): "HGBA1C" in the last 72 hours. CBG: No results for input(s): "GLUCAP" in the last 168 hours. Lipid Profile: No results for input(s): "CHOL", "HDL", "LDLCALC",  "TRIG", "CHOLHDL", "LDLDIRECT" in the last 72 hours. Thyroid Function Tests: No results for input(s): "TSH", "T4TOTAL", "FREET4", "T3FREE", "THYROIDAB" in the last 72 hours. Anemia Panel: No results for input(s): "VITAMINB12", "FOLATE", "FERRITIN", "TIBC", "IRON", "RETICCTPCT" in the last 72 hours. Urine analysis:    Component Value Date/Time   COLORURINE YELLOW 05/31/2023 1454   APPEARANCEUR CLEAR 05/31/2023 1454   LABSPEC 1.016 05/31/2023 1454   PHURINE 5.0 05/31/2023 1454   GLUCOSEU NEGATIVE 05/31/2023 1454   HGBUR MODERATE (A) 05/31/2023 1454   BILIRUBINUR NEGATIVE 05/31/2023 1454  KETONESUR 5 (A) 05/31/2023 1454   PROTEINUR >=300 (A) 05/31/2023 1454   UROBILINOGEN 0.2 05/13/2007 0910   NITRITE NEGATIVE 05/31/2023 1454   LEUKOCYTESUR NEGATIVE 05/31/2023 1454   Sepsis Labs: @LABRCNTIP (procalcitonin:4,lacticidven:4) ) Recent Results (from the past 240 hours)  Blood Culture (routine x 2)     Status: None (Preliminary result)   Collection Time: 05/31/23  2:10 PM   Specimen: BLOOD LEFT ARM  Result Value Ref Range Status   Specimen Description   Final    BLOOD LEFT ARM Performed at [facility], [address], [place], [place] [address]    Special Requests   Final    BOTTLES DRAWN AEROBIC AND ANAEROBIC Blood Culture results may not be optimal due to an inadequate volume of blood received in culture bottles Performed at [facility], [address], [place], [place] [address]    Culture PENDING  Incomplete   Report Status PENDING  Incomplete  Resp panel by RT-PCR (RSV, Flu A&B, Covid) Anterior Nasal Swab     Status: Abnormal   Collection Time: 05/31/23  2:28 PM   Specimen: Anterior Nasal Swab  Result Value Ref Range Status   SARS Coronavirus 2 by RT PCR NEGATIVE NEGATIVE Final    Comment: (NOTE) SARS-CoV-2 target nucleic acids are NOT DETECTED.  The SARS-CoV-2 RNA is generally detectable in upper respiratory specimens during the acute phase of  infection. The lowest concentration of SARS-CoV-2 viral copies this assay can detect is 138 copies/mL. A negative result does not preclude SARS-Cov-2 infection and should not be used as the sole basis for treatment or other patient management decisions. A negative result may occur with  improper specimen collection/handling, submission of specimen other than nasopharyngeal swab, presence of viral mutation(s) within the areas targeted by this assay, and inadequate number of viral copies(<138 copies/mL). A negative result must be combined with clinical observations, patient history, and epidemiological information. The expected result is Negative.  Fact Sheet for Patients:  BloggerCourse.com  Fact Sheet for Healthcare Providers:  SeriousBroker.it  This test is no t yet approved or cleared by the [place] FDA and  has been authorized for detection and/or diagnosis of SARS-CoV-2 by FDA under an Emergency Use Authorization (EUA). This EUA will remain  in effect (meaning this test can be used) for the duration of the COVID-19 declaration under Section 564(b)(1) of the Act, 21 U.S.C.section 360bbb-3(b)(1), unless the authorization is terminated  or revoked sooner.       Influenza A by PCR POSITIVE (A) NEGATIVE Final   Influenza B by PCR NEGATIVE NEGATIVE Final    Comment: (NOTE) The Xpert Xpress SARS-CoV-2/FLU/RSV plus assay is intended as an aid in the diagnosis of influenza from Nasopharyngeal swab specimens and should not be used as a sole basis for treatment. Nasal washings and aspirates are unacceptable for Xpert Xpress SARS-CoV-2/FLU/RSV testing.  Fact Sheet for Patients: BloggerCourse.com  Fact Sheet for Healthcare Providers: SeriousBroker.it  This test is not yet approved or cleared by the [place] FDA and has been authorized for detection and/or diagnosis of  SARS-CoV-2 by FDA under an Emergency Use Authorization (EUA). This EUA will remain in effect (meaning this test can be used) for the duration of the COVID-19 declaration under Section 564(b)(1) of the Act, 21 U.S.C. section 360bbb-3(b)(1), unless the authorization is terminated or revoked.     Resp Syncytial Virus by PCR NEGATIVE NEGATIVE Final    Comment: (NOTE) Fact Sheet for Patients: BloggerCourse.com  Fact Sheet for  Healthcare Providers: SeriousBroker.it  This test is not yet approved or cleared by the [place] and has been authorized for detection and/or diagnosis of SARS-CoV-2 by FDA under an Emergency Use Authorization (EUA). This EUA will remain in effect (meaning this test can be used) for the duration of the COVID-19 declaration under Section 564(b)(1) of the Act, 21 U.S.C. section 360bbb-3(b)(1), unless the authorization is terminated or revoked.  Performed at [facility], [address] [place], [place] [address]      Radiological Exams on Admission: CT CERVICAL SPINE WO CONTRAST Result Date: 05/31/2023 CLINICAL DATA:  Polytrauma, blunt.  Multiple falls. EXAM: CT CERVICAL SPINE WITHOUT CONTRAST TECHNIQUE: Multidetector CT imaging of the cervical spine was performed without intravenous contrast. Multiplanar CT image reconstructions were also generated. RADIATION DOSE REDUCTION: This exam was performed according to the departmental dose-optimization program which includes automated exposure control, adjustment of the mA and/or kV according to patient size and/or use of iterative reconstruction technique. COMPARISON:  None Available. FINDINGS: Alignment: Normal Skull base and vertebrae: No acute fracture. No primary bone lesion or focal pathologic process. Soft tissues and spinal canal: No prevertebral fluid or swelling. No visible canal hematoma. Disc levels: Diffuse advanced degenerative disc  disease with fusion across the C5-6 and C6-7 disc spaces. Advanced bilateral degenerative facet disease with fusion diffusely. Upper chest: No acute findings Other: None IMPRESSION: Diffuse advanced degenerative disc disease and facet disease. No acute bony abnormality. Electronically Signed   By: Charlett Nose M.D.   On: 05/31/2023 17:23   CT HEAD WO CONTRAST Result Date: 05/31/2023 CLINICAL DATA:  Head trauma, moderate-severe.  Multiple falls. EXAM: CT HEAD WITHOUT CONTRAST TECHNIQUE: Contiguous axial images were obtained from the base of the skull through the vertex without intravenous contrast. RADIATION DOSE REDUCTION: This exam was performed according to the departmental dose-optimization program which includes automated exposure control, adjustment of the mA and/or kV according to patient size and/or use of iterative reconstruction technique. COMPARISON:  None Available. FINDINGS: Brain: There is atrophy and chronic small vessel disease changes. No acute intracranial abnormality. Specifically, no hemorrhage, hydrocephalus, mass lesion, acute infarction, or significant intracranial injury. Vascular: No hyperdense vessel or unexpected calcification. Skull: No acute calvarial abnormality. Sinuses/Orbits: No acute findings Other: None IMPRESSION: Atrophy, chronic microvascular disease. No acute intracranial abnormality. Electronically Signed   By: Charlett Nose M.D.   On: 05/31/2023 17:21   DG Pelvis Portable Result Date: 05/31/2023 CLINICAL DATA:  Multiple mechanical falls EXAM: PORTABLE PELVIS 1-2 VIEWS COMPARISON:  None Available. FINDINGS: Supine frontal view of the pelvis includes both hips. No acute displaced fracture, subluxation, or dislocation. Mild symmetrical bilateral hip osteoarthritis. Severe lower lumbar degenerative changes greatest at L4-5 and L5-S1. Soft tissues are unremarkable. IMPRESSION: 1. No acute pelvic fracture. 2. Degenerative changes of the lower lumbar spine and bilateral hips.  Electronically Signed   By: Sharlet Salina M.D.   On: 05/31/2023 15:40   DG Chest Port 1 View Result Date: 05/31/2023 CLINICAL DATA:  Questionable sepsis EXAM: PORTABLE CHEST 1 VIEW COMPARISON:  Right shoulder x-ray 06/20/2011. FINDINGS: Single frontal view of the chest demonstrates an unremarkable cardiac silhouette. Chronic elevation of the right hemidiaphragm. No acute airspace disease, effusion, or pneumothorax. No acute bony abnormalities. IMPRESSION: 1. No acute intrathoracic process. Electronically Signed   By: Sharlet Salina M.D.   On: 05/31/2023 15:39    EKG: Independently reviewed.  A-fib rate controlled.  Assessment/Plan Principal Problem:   Atrial fibrillation (HCC) Active Problems:  CAD S/P percutaneous coronary angioplasty   Carotid stenosis   HTN (hypertension)   Dyslipidemia   Fall at home, initial encounter   Elevated troponin    Acute bronchitis with influenza A infection.  Will keep patient on scheduled nebulizer with Solu-Medrol as patient is diffusely wheezing.  Will add Tamiflu. A-fib appears to be new onset.  Is rate controlled at this time.  CHADS2 Vascor of at least 4.  For now I am keeping patient on heparin infusion but not sure if patient would be a candidate for long-term anticoagulation given the history of falls.  Will need to get further history from family.  Check 2D echo.  TSH.  Will consult cardiology. Elevated troponin with history of CAD.  Denies any chest pain.  Check 2D echo.  Aspirin presently on heparin.  Will need to confirm home medications.  On reviewing care everywhere looks like patient is on hydrochlorothiazide metoprolol simvastatin tamsulosin lisinopril allopurinol. Hypertensive urgency reviewing patient's primary care physician's note in Care Everywhere looks like patient is on metoprolol, hydrochlorothiazide, lisinopril.  Will continue metoprolol lisinopril hold hydrochlorothiazide and may have to hold lisinopril if creatinine does not  improve. Fall cause not clear.  Will closely monitor on telemetry.  Get physical therapy consult. Acute renal failure did receive fluid bolus in the ER.  Recheck metabolic panel.  Hold lisinopril if creatinine does not improve. Macrocytic anemia check B12 and folate levels. Hyperlipidemia on statins. Prior history of subdural hematoma after fall.  Need to get further history once family available.  Since patient has new onset A-fib with bronchitis elevated troponin will need close monitoring and more than 2 midnight stay.  Addendum -   was able to reach patient's wife.  Patient's wife states that patient was sitting on the commode and while giving enema he lost his balance and fell onto the floor.  Did not lose consciousness.  Patient's wife states that she herself was sick for last 2 days with flu.  Patient's wife was not able to give the list of medication patient takes but will try to bring it to the hospital.   DVT prophylaxis: Heparin infusion. Code Status: Full code. Family Communication: Patient's wife. Disposition Plan: Monitored bed. Consults called: Cardiology. Admission status: Labs patient.

## 2023-05-31 NOTE — ED Triage Notes (Signed)
 Pt. BIB [facility] for multiple mechanical falls. Pt. Fell prior to EMS arrival and once that was witnessed by EMS. Pt. Is A&Ox4 per EMS did not lose consciousness or hit his head. Does not take blood thinners.

## 2023-05-31 NOTE — ED Provider Notes (Signed)
 [name] Provider Note   CSN: [id number] Arrival date & time: 05/31/23  1251     History  Chief Complaint  Patient presents with   [name] is a 89 y.o. male.   Fall  Patient presenting for multiple falls.  Medical history includes HTN, HLD, GERD, BPH, CAD.  He is not prescribed anticoagulation.  Per EMS, patient had multiple mechanical falls prior to arrival.  1 of these was witnessed by EMS.  Patient, himself, states that he fell 1 time.  He denies any current areas of discomfort.     Home Medications Prior to Admission medications   Medication Sig Start Date End Date Taking? Authorizing Provider  allopurinol (ZYLOPRIM) 300 MG tablet Take 300 mg by mouth Daily.  09/11/10   [provider]  aspirin 81 MG tablet Take 81 mg by mouth daily.    [provider]  docusate sodium (COLACE) 100 MG capsule Take 100 mg by mouth daily as needed. For constipation    [provider]  finasteride (PROSCAR) 5 MG tablet Take 5 mg by mouth Daily.  09/12/10   [provider]  hydrochlorothiazide (HYDRODIURIL) 25 MG tablet Take 12.5-25 mg by mouth daily.    [provider]  ibuprofen (ADVIL,MOTRIN) 200 MG tablet Take 200 mg by mouth every 6 (six) hours as needed for pain.    [provider]  latanoprost (XALATAN) 0.005 % ophthalmic solution Place 1 drop into the left eye at bedtime. 04/14/19   [provider]  lisinopril (PRINIVIL,ZESTRIL) 40 MG tablet Take 1 tablet by mouth Daily.     [provider]  metoprolol tartrate (LOPRESSOR) 25 MG tablet TAKE 1 BY MOUTH TWICE DAILY 03/12/15   Rollene Rotunda, MD  nitroGLYCERIN (NITROSTAT) 0.4 MG SL tablet Place 1 tablet (0.4 mg total) under the tongue every 5 (five) minutes as needed. 04/07/16   Rollene Rotunda, MD  ranitidine (ZANTAC) 150 MG tablet Take 150 mg by mouth daily as needed. For heartburn    [provider]   simvastatin (ZOCOR) 80 MG tablet Take 80 mg by mouth at bedtime.    [provider]  Tamsulosin HCl (FLOMAX) 0.4 MG CAPS Take 0.4 mg by mouth Daily.     [provider]  timolol (TIMOPTIC) 0.5 % ophthalmic solution Place 1 drop into the left eye 2 (two) times daily. 04/05/18   [provider]      Allergies    Patient has no known allergies.    Review of Systems   Review of Systems  Unable to perform ROS: Other (Seems to have poor memory.)    Physical Exam Updated Vital Signs BP (!) 181/58 (BP Location: Right Arm)   Pulse 65   Temp 100.3 F (37.9 C) (Oral)   Resp 15   SpO2 93%  Physical Exam Vitals and nursing note reviewed.  Constitutional:      General: He is not in acute distress.    Appearance: Normal appearance. He is well-developed. He is not ill-appearing, toxic-appearing or diaphoretic.  HENT:     Head: Normocephalic and atraumatic.     Right Ear: External ear normal.     Left Ear: External ear normal.     Nose: Nose normal.     Mouth/Throat:     Mouth: Mucous membranes are moist.  Eyes:     Conjunctiva/sclera: Conjunctivae normal.     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  Cardiovascular:     Rate and Rhythm: Normal rate and regular rhythm.     Heart sounds: No murmur heard. Pulmonary:     Effort: Pulmonary effort is normal. No respiratory distress.     Breath sounds: Normal breath sounds. No wheezing or rales.  Abdominal:     General: There is no distension.     Palpations: Abdomen is soft.     Tenderness: There is no abdominal tenderness.  Musculoskeletal:        General: No swelling or deformity. Normal range of motion.     Cervical back: Normal range of motion and neck supple.     Right lower leg: No edema.     Left lower leg: No edema.  Skin:    General: Skin is warm and dry.     Coloration: Skin is not jaundiced or pale.  Neurological:     General: No focal deficit present.     Mental  Status: He is alert. He is disoriented.  Psychiatric:        Mood and Affect: Mood normal.        Behavior: Behavior normal.     ED Results / Procedures / Treatments   Labs (all labs ordered are listed, but only abnormal results are displayed) Labs Reviewed  CBC WITH DIFFERENTIAL/PLATELET - Abnormal; Notable for the following components:      Result Value   RBC 3.65 (*)    Hemoglobin 11.5 (*)    HCT 36.0 (*)    Platelets 138 (*)    Lymphs Abs 0.6 (*)    Monocytes Absolute 1.2 (*)    All other components within normal limits  CULTURE, BLOOD (ROUTINE X 2)  CULTURE, BLOOD (ROUTINE X 2)  RESP PANEL BY RT-PCR (RSV, FLU A&B, COVID)  [id number]  COMPREHENSIVE METABOLIC PANEL WITH GFR  URINALYSIS, W/ REFLEX TO CULTURE (INFECTION SUSPECTED)  MAGNESIUM  I-STAT CG4 LACTIC ACID, ED  I-STAT CG4 LACTIC ACID, ED  TROPONIN I (HIGH SENSITIVITY)  TROPONIN I (HIGH SENSITIVITY)    EKG EKG Interpretation Date/Time:  Monday May 31 2023 14:33:37 EDT Ventricular Rate:  69 PR Interval:    QRS Duration:  118 QT Interval:  394 QTC Calculation: 422 R Axis:   -19  Text Interpretation: Atrial fibrillation Left ventricular hypertrophy with QRS widening ( R in aVL , Sokolow-Lyon , Cornell product ) ST & T wave abnormality, consider lateral ischemia Abnormal ECG Confirmed by Gloris Manchester ([id number]) on 05/31/2023 2:47:34 PM  Radiology No results found.  Procedures Procedures    Medications Ordered in ED Medications  lactated ringers bolus 500 mL (500 mLs Intravenous New Bag/Given 05/31/23 1425)  acetaminophen (TYLENOL) tablet 650 mg (650 mg Oral Given 05/31/23 1441)    ED Course/ Medical Decision Making/ A&P                                 Medical Decision Making Amount and/or Complexity of Data Reviewed Labs: ordered. Radiology: ordered.  Risk OTC drugs.   This patient presents to the ED for concern of falls, this involves an extensive number of treatment options, and is a complaint that  carries with it a high risk of complications and morbidity.  The differential diagnosis includes acute injuries, infection, dehydration, polypharmacy, CVA, deconditioning   Co morbidities that complicate the patient evaluation  HTN, HLD, GERD, BPH, CAD   Additional history obtained:  Additional history obtained from N/A External records from outside source obtained and reviewed including EMR   Lab Tests:  I Ordered, and personally interpreted labs.  The pertinent results include: No leukocytosis, normal lactate, remaining lab work pending at time of signout.   Imaging Studies ordered:  I ordered imaging studies including x-ray of chest and pelvis, CT of head and cervical spine, MRI brain I independently visualized and interpreted imaging which showed (pending at time of signout) I agree with the radiologist interpretation   Cardiac Monitoring: / EKG:  The patient was maintained on a cardiac monitor.  I personally viewed and interpreted the cardiac monitored which showed an underlying rhythm of: Atrial fibrillation   Problem List / ED Course / Critical interventions / Medication management  Patient presenting for report of multiple mechanical falls.  On arrival in the ED, he is awake and alert.  When asked about his falls, patient does appear to have a poor memory.  He states that he thinks he fell 1 time.  He thinks it was probably his wife who called EMS.  Notably, patient has no complaints of any areas of discomfort.  He has full range of motion in all extremities.  He has a very superficial abrasion to his left elbow but no other exam findings concerning for injury.  Of note, he was found to have low-grade temperature on his arrival in the ED.  He does have a cough present on exam.  This may have contributed to weakness, leading to falls.  Workup was initiated.  EKG shows concern of new onset atrial fibrillation.  Given his A-fib and report of new onset falls, there is concern of  possible CVA.  MRI was ordered.  Patient was moved to room with cardiac monitor.  I attempted to call his wife.  She did not do the following but was unable to hear or understand me.  There were no other family contacts listed.  Workup was pending at time of signout.  Care of patient was signed to oncoming ED provider. I ordered medication including IV fluids for hydration; Tylenol for antipyresis Reevaluation of the patient after these medicines showed that the patient improved I have reviewed the patients home medicines and have made adjustments as needed   Social Determinants of Health:  Lives at home with wife        Final Clinical Impression(s) / ED Diagnoses Final diagnoses:  Fall, initial encounter    Rx / DC Orders ED Discharge Orders     None         Gloris Manchester, MD 05/31/23 1514

## 2023-06-01 ENCOUNTER — Observation Stay (HOSPITAL_BASED_OUTPATIENT_CLINIC_OR_DEPARTMENT_OTHER)

## 2023-06-01 DIAGNOSIS — I2510 Atherosclerotic heart disease of native coronary artery without angina pectoris: Secondary | ICD-10-CM

## 2023-06-01 DIAGNOSIS — I4891 Unspecified atrial fibrillation: Secondary | ICD-10-CM

## 2023-06-01 DIAGNOSIS — I4819 Other persistent atrial fibrillation: Secondary | ICD-10-CM | POA: Diagnosis not present

## 2023-06-01 DIAGNOSIS — J101 Influenza due to other identified influenza virus with other respiratory manifestations: Secondary | ICD-10-CM | POA: Diagnosis not present

## 2023-06-01 DIAGNOSIS — D696 Thrombocytopenia, unspecified: Secondary | ICD-10-CM

## 2023-06-01 DIAGNOSIS — I160 Hypertensive urgency: Secondary | ICD-10-CM

## 2023-06-01 DIAGNOSIS — J209 Acute bronchitis, unspecified: Secondary | ICD-10-CM | POA: Diagnosis not present

## 2023-06-01 DIAGNOSIS — R7989 Other specified abnormal findings of blood chemistry: Secondary | ICD-10-CM | POA: Insufficient documentation

## 2023-06-01 LAB — CBC
HCT: 37.2 % — ABNORMAL LOW (ref 39.0–52.0)
Hemoglobin: 11.5 g/dL — ABNORMAL LOW (ref 13.0–17.0)
MCH: 31.9 pg (ref 26.0–34.0)
MCHC: 30.9 g/dL (ref 30.0–36.0)
MCV: 103.3 fL — ABNORMAL HIGH (ref 80.0–100.0)
Platelets: 118 10*3/uL — ABNORMAL LOW (ref 150–400)
RBC: 3.6 MIL/uL — ABNORMAL LOW (ref 4.22–5.81)
RDW: 15 % (ref 11.5–15.5)
WBC: 8.8 10*3/uL (ref 4.0–10.5)
nRBC: 0 % (ref 0.0–0.2)

## 2023-06-01 LAB — LIPID PANEL
Cholesterol: 107 mg/dL (ref 0–200)
HDL: 45 mg/dL (ref >40)
LDL Cholesterol: 52 mg/dL (ref 0–99)
Total CHOL/HDL Ratio: 2.4 ratio
Triglycerides: 49 mg/dL (ref <150)
VLDL: 10 mg/dL (ref 0–40)

## 2023-06-01 LAB — COMPREHENSIVE METABOLIC PANEL WITH GFR
ALT: 19 U/L (ref 0–44)
AST: 66 U/L — ABNORMAL HIGH (ref 15–41)
Albumin: 3.2 g/dL — ABNORMAL LOW (ref 3.5–5.0)
Alkaline Phosphatase: 55 U/L (ref 38–126)
Anion gap: 10 (ref 5–15)
BUN: 26 mg/dL — ABNORMAL HIGH (ref 8–23)
CO2: 22 mmol/L (ref 22–32)
Calcium: 9 mg/dL (ref 8.9–10.3)
Chloride: 102 mmol/L (ref 98–111)
Creatinine, Ser: 1.23 mg/dL (ref 0.61–1.24)
GFR, Estimated: 55 mL/min — ABNORMAL LOW (ref >60)
Glucose, Bld: 119 mg/dL — ABNORMAL HIGH (ref 70–99)
Potassium: 3.5 mmol/L (ref 3.5–5.1)
Sodium: 134 mmol/L — ABNORMAL LOW (ref 135–145)
Total Bilirubin: 1.1 mg/dL (ref 0.0–1.2)
Total Protein: 5.8 g/dL — ABNORMAL LOW (ref 6.5–8.1)

## 2023-06-01 LAB — ECHOCARDIOGRAM COMPLETE
AR max vel: 3.39 cm2
AV Area VTI: 3 cm2
AV Area mean vel: 2.97 cm2
AV Mean grad: 5.3 mmHg
AV Peak grad: 10.8 mmHg
Ao pk vel: 1.64 m/s
Area-P 1/2: 5.54 cm2
Calc EF: 64.4 %
Height: 68 in
S' Lateral: 2.3 cm
Single Plane A2C EF: 68.3 %
Single Plane A4C EF: 61.3 %
Weight: 2768.98 [oz_av]

## 2023-06-01 LAB — HEMOGLOBIN A1C
Hgb A1c MFr Bld: 5.8 % — ABNORMAL HIGH (ref 4.8–5.6)
Mean Plasma Glucose: 119.76 mg/dL

## 2023-06-01 LAB — TSH: TSH: 0.297 u[IU]/mL — ABNORMAL LOW (ref 0.350–4.500)

## 2023-06-01 LAB — HEPARIN LEVEL (UNFRACTIONATED): Heparin Unfractionated: 0.81 [IU]/mL — ABNORMAL HIGH (ref 0.30–0.70)

## 2023-06-01 LAB — TROPONIN I (HIGH SENSITIVITY)
Troponin I (High Sensitivity): 116 ng/L (ref <18)
Troponin I (High Sensitivity): 128 ng/L (ref <18)

## 2023-06-01 MED ORDER — LACTATED RINGERS IV SOLN: INTRAVENOUS | Status: AC

## 2023-06-01 MED ORDER — HEPARIN (PORCINE) 25000 UT/250ML-% IV SOLN
850.0000 [IU]/h | INTRAVENOUS | Status: DC
  Administered 2023-06-01 – 2023-06-02 (×2): 850 [IU]/h via INTRAVENOUS
  Filled 2023-06-01: qty 250

## 2023-06-01 MED ORDER — HEPARIN (PORCINE) 25000 UT/250ML-% IV SOLN
850.0000 [IU]/h | INTRAVENOUS | Status: DC
  Administered 2023-06-01: 1000 [IU]/h via INTRAVENOUS
  Filled 2023-06-01 (×2): qty 250

## 2023-06-01 MED ORDER — METHYLPREDNISOLONE SODIUM SUCC 40 MG IJ SOLR
40.0000 mg | Freq: Two times a day (BID) | INTRAMUSCULAR | Status: DC
  Administered 2023-06-01 (×2): 40 mg via INTRAVENOUS
  Filled 2023-06-01 (×2): qty 1

## 2023-06-01 MED ORDER — HYDRALAZINE HCL 20 MG/ML IJ SOLN
10.0000 mg | Freq: Once | INTRAMUSCULAR | Status: AC
  Administered 2023-06-01: 10 mg via INTRAVENOUS
  Filled 2023-06-01: qty 1

## 2023-06-01 MED ORDER — HYDRALAZINE HCL 20 MG/ML IJ SOLN
5.0000 mg | INTRAMUSCULAR | Status: DC | PRN
  Administered 2023-06-01 – 2023-06-08 (×7): 5 mg via INTRAVENOUS
  Filled 2023-06-01 (×7): qty 1

## 2023-06-01 MED ORDER — ATORVASTATIN CALCIUM 40 MG PO TABS
40.0000 mg | ORAL_TABLET | Freq: Every day | ORAL | Status: DC
  Administered 2023-06-01 – 2023-06-08 (×8): 40 mg via ORAL
  Filled 2023-06-01 (×8): qty 1

## 2023-06-01 MED ORDER — LISINOPRIL 10 MG PO TABS
40.0000 mg | ORAL_TABLET | Freq: Every day | ORAL | Status: DC
  Administered 2023-06-01 – 2023-06-08 (×8): 40 mg via ORAL
  Filled 2023-06-01: qty 4
  Filled 2023-06-01 (×2): qty 2
  Filled 2023-06-01: qty 4
  Filled 2023-06-01: qty 2
  Filled 2023-06-01: qty 4
  Filled 2023-06-01: qty 2
  Filled 2023-06-01: qty 4

## 2023-06-01 MED ORDER — OSELTAMIVIR PHOSPHATE 75 MG PO CAPS
75.0000 mg | ORAL_CAPSULE | Freq: Once | ORAL | Status: AC
  Administered 2023-06-01: 75 mg via ORAL
  Filled 2023-06-01: qty 1

## 2023-06-01 MED ORDER — IPRATROPIUM-ALBUTEROL 0.5-2.5 (3) MG/3ML IN SOLN
3.0000 mL | RESPIRATORY_TRACT | Status: AC
  Administered 2023-06-01 – 2023-06-02 (×8): 3 mL via RESPIRATORY_TRACT
  Filled 2023-06-01 (×9): qty 3

## 2023-06-01 MED ORDER — PREDNISONE 20 MG PO TABS
40.0000 mg | ORAL_TABLET | Freq: Every day | ORAL | Status: DC
  Administered 2023-06-02 – 2023-06-03 (×2): 40 mg via ORAL
  Filled 2023-06-01 (×2): qty 2

## 2023-06-01 MED ORDER — ASPIRIN 81 MG PO TBEC
81.0000 mg | DELAYED_RELEASE_TABLET | Freq: Every day | ORAL | Status: DC
  Administered 2023-06-01 – 2023-06-08 (×7): 81 mg via ORAL
  Filled 2023-06-01 (×8): qty 1

## 2023-06-01 MED ORDER — METOPROLOL TARTRATE 25 MG PO TABS
25.0000 mg | ORAL_TABLET | Freq: Two times a day (BID) | ORAL | Status: DC
  Administered 2023-06-01 – 2023-06-02 (×3): 25 mg via ORAL
  Filled 2023-06-01 (×3): qty 1

## 2023-06-01 MED ORDER — OSELTAMIVIR PHOSPHATE 30 MG PO CAPS
30.0000 mg | ORAL_CAPSULE | Freq: Two times a day (BID) | ORAL | Status: AC
  Administered 2023-06-01 – 2023-06-05 (×9): 30 mg via ORAL
  Filled 2023-06-01 (×10): qty 1

## 2023-06-01 MED ORDER — HEPARIN BOLUS VIA INFUSION
4000.0000 [IU] | Freq: Once | INTRAVENOUS | Status: AC
  Administered 2023-06-01: 4000 [IU] via INTRAVENOUS
  Filled 2023-06-01: qty 4000

## 2023-06-01 NOTE — Plan of Care (Signed)
  Problem: Education: Goal: Knowledge of General Education information will improve Description: Including pain rating scale, medication(s)/side effects and non-pharmacologic comfort measures Outcome: Progressing   Problem: Activity: Goal: Risk for activity intolerance will decrease Outcome: Progressing   Problem: Nutrition: Goal: Adequate nutrition will be maintained Outcome: Progressing   Problem: Coping: Goal: Level of anxiety will decrease Outcome: Progressing   Problem: Elimination: Goal: Will not experience complications related to urinary retention Outcome: Progressing   Problem: Pain Managment: Goal: General experience of comfort will improve and/or be controlled Outcome: Progressing   Problem: Safety: Goal: Ability to remain free from injury will improve Outcome: Progressing   Problem: Skin Integrity: Goal: Risk for impaired skin integrity will decrease Outcome: Progressing

## 2023-06-01 NOTE — TOC Initial Note (Signed)
 Transition of Care [facility]) - Initial/Assessment Note    Patient Details  Name: [name] MRN: [id number] Date of Birth: [birth year]  [facility]) CM/SW Contact:    Larrie Kass, LCSW Phone Number: 06/01/2023, 4:07 PM  Clinical Narrative:                 CSW spoke with the pt's son to discuss the Medicare notice and also discussed recommendations for SNF placement. The pt's son has agreed to the placement. He stated that the pt's PCP mentioned the pt would benefit from PT services. CSW explained the process, noting that the patient will need insurance authorization. The patient's son instructed the CSW to place the MOON notice in the pt's room for him to pick up tomorrow. CSW will work pt up for SNF placement. TOC to follow.     Expected Discharge Plan: Skilled Nursing Facility Barriers to Discharge: Continued Medical Work up   Patient Goals and CMS Choice Patient states their goals for this hospitalization and ongoing recovery are:: snf to get stonger          Expected Discharge Plan and Services       Living arrangements for the past 2 months: Single Family Home                                      Prior Living Arrangements/Services Living arrangements for the past 2 months: Single Family Home Lives with:: Self, Spouse   Do you feel safe going back to the place where you live?: Yes               Activities of Daily Living   ADL Screening (condition at time of admission) Independently performs ADLs?: No Does the patient have a NEW difficulty with bathing/dressing/toileting/self-feeding that is expected to last >3 days?: No Does the patient have a NEW difficulty with getting in/out of bed, walking, or climbing stairs that is expected to last >3 days?: No Does the patient have a NEW difficulty with communication that is expected to last >3 days?: No Is the patient deaf or have difficulty hearing?: No Does the patient have difficulty  seeing, even when wearing glasses/contacts?: Yes Does the patient have difficulty concentrating, remembering, or making decisions?: No  Permission Sought/Granted                  Emotional Assessment              Admission diagnosis:  Atrial fibrillation (HCC) [I48.91] Influenza [J11.1] Fall, initial encounter [W19.XXXA] Atrial fibrillation, unspecified type [facility]) [I48.91] Patient Active Problem List   Diagnosis Date Noted   Atrial fibrillation (HCC) 05/31/2023   Fall at home, initial encounter 05/31/2023   Elevated troponin 05/31/2023   Influenza A 05/31/2023   Acute bronchitis 05/31/2023   ARF (acute renal failure) (HCC) 05/31/2023   Thrombocytopenia (HCC) 05/31/2023   Pain due to onychomycosis of toenails of both feet 11/07/2019   Subdural hematoma (HCC) 08/16/2012   Hemorrhoids, internal, with bleeding 10/31/2011   Lower GI bleed 10/30/2011   HTN (hypertension) 12/17/2010   Dyslipidemia 12/17/2010   CAD S/P percutaneous coronary angioplasty    Carotid stenosis    PCP:  Tisovec, Adelfa Koh, MD Pharmacy:   [facility] [phone] [place], [place] - [address] AT [facility] [address] & [facility] [address] [place] [address] Phone: [phone] Fax:  [phone]     Social Drivers of Health (SDOH) Social History: SDOH Screenings   Food Insecurity: No Food Insecurity (06/01/2023)  Housing: Low Risk  (06/01/2023)  Transportation Needs: No Transportation Needs (06/01/2023)  Utilities: Not At Risk (06/01/2023)  Social Connections: Unknown (06/01/2023)  Tobacco Use: Medium Risk (05/31/2023)   SDOH Interventions:     Readmission Risk Interventions     No data to display

## 2023-06-01 NOTE — Progress Notes (Signed)
 MD notified that patient has rash on abdomen. RN tried to to put in standing order for hydrocortisone cream for the rash; however, the order was flagged due to patient's known allergy. RN asked MD if there was anything else patient could get for the rash.

## 2023-06-01 NOTE — Progress Notes (Signed)
  Echocardiogram 2D Echocardiogram has been performed.  Ocie Doyne RDCS 06/01/2023, 8:58 AM

## 2023-06-01 NOTE — Progress Notes (Signed)
 PHARMACY - ANTICOAGULATION CONSULT NOTE  Pharmacy Consult for Heparin Indication: atrial fibrillation  No Known Allergies  Patient Measurements:    Vital Signs: Temp: (P) 98.2 F (36.8 C) (04/01 0355) Temp Source: (P) Oral (04/01 0355) BP: (P) 161/Kenneth (04/01 0355) Pulse Rate: (P) 77 (04/01 0355)  Labs: Recent Labs    05/31/23 1410 05/31/23 1622 06/01/23 0016 06/01/23 0409  HGB 11.5*  --   --  11.5*  HCT 36.0*  --   --  37.2*  PLT 138*  --   --  118*  CREATININE 1.32*  --   --   --   TROPONINIHS 100* 135* 116*  --     CrCl cannot be calculated (Unknown ideal weight.).   Medical History: Past Medical History:  Diagnosis Date   Acute GI bleeding 10/30/2011   Anginal pain (HCC) 12/2010   Arthritis    "knees and left shoulder is completely shot"   BPH (benign prostatic hyperplasia)    CAD (coronary artery disease)    RCA occluded (Cath [place] 1980s);  LHC 12/22/10: dLM 30%, prox to mid LAD 30%, oOM 95%, AV branches supplied collats to PDA, RCA occluded, EF 65%.  He underwent placement of a Promus DES to the OM.   Carotid stenosis    0 - 39% bilateral   Diverticulosis    Exertional dyspnea 10/30/2011   GERD (gastroesophageal reflux disease)    Glaucoma    Gout    Hyperlipidemia    Hypertension    Pneumonia 1950's    Medications:  Infusions:   Assessment: 89 yo [name] with Afib, hx CAD. Pharmacy consulted to dose IV heparin. No on anticoagulation PTA. Baseline labs: CBC: Hg slightly low 11.5, pltc slightly low 138, Scr slightly elevated 1.32   Goal of Therapy:  Heparin level 0.3-0.7 units/ml Monitor platelets by anticoagulation protocol: Yes   Plan:  Heparin IV bolus 4000 units IV x`1 Heparin IV infusion at 1000 units/hr Check heparin level 8h after infusion started Daily heparin level & CBC while on heparin  Junita Push PharmD 06/01/2023,4:51 AM

## 2023-06-01 NOTE — Care Management Obs Status (Signed)
 MEDICARE OBSERVATION STATUS NOTIFICATION   Patient Details  Name: [name] MRN: [id number] Date of Birth: 07/29/1930   Medicare Observation Status Notification Given:  Yes    Larrie Kass, LCSW 06/01/2023, 4:06 PM

## 2023-06-01 NOTE — Evaluation (Signed)
 Physical Therapy Evaluation Patient Details Name: [name] MRN: [id number] DOB: 08-26-1930 Today's Date: 06/01/2023  History of Present Illness  89 yo male admitted with Afib, falls, flu (+). Hx of CAD, BPH, gout, TURP  Clinical Impression  On eval, pt required Min A for mobility. He was able to stand and perform a stand pivot with use of RW. Pt presents with general weakness, decreased activity tolerance, and impaired gait and balance. Pt remains at risk for falls when mobilizing. No family was present during session. At this time, feel patient will benefit from continued inpatient follow up therapy, <3 hours/day--unless family can provide current level of assist. Will plan to follow and progress activity as safely able.       If plan is discharge home, recommend the following: A little help with walking and/or transfers;A little help with bathing/dressing/bathroom;Assistance with cooking/housework;Assist for transportation;Help with stairs or ramp for entrance   Can travel by private vehicle        Equipment Recommendations None recommended by PT  Recommendations for Other Services       Functional Status Assessment Patient has had a recent decline in their functional status and demonstrates the ability to make significant improvements in function in a reasonable and predictable amount of time.     Precautions / Restrictions Precautions Precautions: Fall Restrictions Weight Bearing Restrictions Per Provider Order: No      Mobility  Bed Mobility Overal bed mobility: Needs Assistance Bed Mobility: Supine to Sit     Supine to sit: Contact guard, HOB elevated, Used rails     General bed mobility comments: Cues provided. Increased time.    Transfers Overall transfer level: Needs assistance Equipment used: Rolling walker (2 wheels) Transfers: Sit to/from Stand, Bed to chair/wheelchair/BSC Sit to Stand: Min assist, From elevated surface   Step pivot transfers: Min  assist       General transfer comment: Pt attempted multiple times to rise unassisted but was unable. Cues for safety, technqiue, hand placement. With Min A and elevated bed, pt eventually able to stand. Unsteady. Assist to manage RW and steady pt during pivot transfer. O2 94% on RA, HR 69 bpm.    Ambulation/Gait                  Stairs            Wheelchair Mobility     Tilt Bed    Modified Rankin (Stroke Patients Only)       Balance Overall balance assessment: Needs assistance         Standing balance support: Bilateral upper extremity supported, During functional activity, Reliant on assistive device for balance Standing balance-Leahy Scale: Poor                               Pertinent Vitals/Pain Pain Assessment Pain Assessment: No/denies pain    Home Living Family/patient expects to be discharged to:: Private residence Living Arrangements: Spouse/significant other   Type of Home: House           Home Equipment: Agricultural consultant (2 wheels) Additional Comments: unsure of accuracy of info provided    Prior Function Prior Level of Function : Independent/Modified Independent;Patient poor historian/Family not available             Mobility Comments: uses RW for ambulation. per chart review, limited amb distances ADLs Comments: pt reports Mod I?     Extremity/Trunk Assessment  Upper Extremity Assessment Upper Extremity Assessment: Defer to OT evaluation    Lower Extremity Assessment Lower Extremity Assessment: Generalized weakness    Cervical / Trunk Assessment Cervical / Trunk Assessment: Kyphotic  Communication   Communication Communication: No apparent difficulties    Cognition Arousal: Alert Behavior During Therapy: WFL for tasks assessed/performed   PT - Cognitive impairments: No family/caregiver present to determine baseline                                 Cueing Cueing Techniques: Verbal  cues     General Comments      Exercises     Assessment/Plan    PT Assessment Patient needs continued PT services  PT Problem List Decreased strength;Decreased range of motion;Decreased activity tolerance;Decreased balance;Decreased mobility;Decreased knowledge of use of DME       PT Treatment Interventions DME instruction;Gait training;Functional mobility training;Therapeutic activities;Therapeutic exercise;Patient/family education;Balance training    PT Goals (Current goals can be found in the Care Plan section)  Acute Rehab PT Goals Patient Stated Goal: none stated PT Goal Formulation: Patient unable to participate in goal setting Time For Goal Achievement: 06/15/23 Potential to Achieve Goals: Fair    Frequency Min 2X/week     Co-evaluation               AM-PAC PT "6 Clicks" Mobility  Outcome Measure Help needed turning from your back to your side while in a flat bed without using bedrails?: A Little Help needed moving from lying on your back to sitting on the side of a flat bed without using bedrails?: A Little Help needed moving to and from a bed to a chair (including a wheelchair)?: A Little Help needed standing up from a chair using your arms (e.g., wheelchair or bedside chair)?: A Little Help needed to walk in hospital room?: A Little Help needed climbing 3-5 steps with a railing? : Total 6 Click Score: 16    End of Session Equipment Utilized During Treatment: Gait belt Activity Tolerance: Patient tolerated treatment well;Patient limited by fatigue Patient left: in chair;with call bell/phone within reach;with chair alarm set   PT Visit Diagnosis: Muscle weakness (generalized) (M62.81);History of falling (Z91.81);Difficulty in walking, not elsewhere classified (R26.2)    Time: 1610-9604 PT Time Calculation (min) (ACUTE ONLY): 24 min   Charges:   PT Evaluation $PT Eval Low Complexity: 1 Low PT Treatments $Therapeutic Activity: 8-22 mins PT General  Charges $$ ACUTE PT VISIT: 1 Visit           Faye Ramsay, PT Acute Rehabilitation  Office: [phone]

## 2023-06-01 NOTE — Progress Notes (Signed)
 PHARMACY - ANTICOAGULATION CONSULT NOTE  Pharmacy Consult for heparin Indication: atrial fibrillation  No Known Allergies  Patient Measurements: Height: 5\' 8"  (172.7 cm) Weight: 78.5 kg (173 lb 1 oz) IBW/kg (Calculated) : 68.4 Heparin dosing weight = total weight  Vital Signs: Temp: 97.6 F (36.4 C) (04/01 1055) Temp Source: Oral (04/01 1055) BP: 127/63 (04/01 1055) Pulse Rate: 64 (04/01 1055)  Labs: Recent Labs    05/31/23 1410 05/31/23 1622 06/01/23 0016 06/01/23 0409  HGB 11.5*  --   --  11.5*  HCT 36.0*  --   --  37.2*  PLT 138*  --   --  118*  CREATININE 1.32*  --   --  1.23  TROPONINIHS 100* 135* 116* 128*    Estimated Creatinine Clearance: 37.8 mL/min (by C-G formula based on SCr of 1.23 mg/dL).   Medical History: Past Medical History:  Diagnosis Date   Acute GI bleeding 10/30/2011   Anginal pain (HCC) 12/2010   Arthritis    "knees and left shoulder is completely shot"   BPH (benign prostatic hyperplasia)    CAD (coronary artery disease)    RCA occluded (Cath [place] 1980s);  LHC 12/22/10: dLM 30%, prox to mid LAD 30%, oOM 95%, AV branches supplied collats to PDA, RCA occluded, EF 65%.  He underwent placement of a Promus DES to the OM.   Carotid stenosis    0 - 39% bilateral   Diverticulosis    Exertional dyspnea 10/30/2011   GERD (gastroesophageal reflux disease)    Glaucoma    Gout    Hyperlipidemia    Hypertension    Pneumonia 1950's    Medications: No anticoagulants PTA  Assessment: Pt is a 73 yoM presenting with falls. PMH significant for GIB in 2013, CAD, HTN. Pharmacy consulted to dose heparin for afib. Cardiology following.   Today, 06/01/23 Heparin level = 0.81 is supratherapeutic on heparin infusion of 1000 units/hr CBC: Hgb low but stable, Plt low Confirmed with RN - no signs of bleeding SCr slightly improved  Goal of Therapy:  Heparin level 0.3-0.7 units/ml Monitor platelets by anticoagulation protocol: Yes   Plan:  Reduce  rate of heparin infusion to 850 units/hr Check heparin level in 8 hours CBC, heparin level daily Monitor for signs of bleeding  Cindi Carbon, PharmD 06/01/2023,11:47 AM

## 2023-06-01 NOTE — Progress Notes (Signed)
 89 year old man PMH CAD, PCI, presented after a fall at home Progress Note   Patient: [name] ZOX:[id number] DOB: [birth year] DOA: 05/31/2023     0 DOS: the patient was seen and examined on 06/01/2023   Brief hospital course: 89 year old man presented after a fall at home.  PMH CAD, PCI.  Evaluation revealed influenza A, atrial fibrillation rate controlled.  Patient admitted for flu and new A-fib.  Consultants Cardiology   Procedures/Events None  Assessment and Plan: Acute bronchitis with influenza A infection No hypoxia.  Continue Tamiflu, prednisone.  A-fib  Elevated troponin New onset.  Echocardiogram reassuring.  Agree with cardiology evaluation and recommendations of no anticoagulation at this time given history of falls and previous subdural hematoma. TSH is modestly depressed.  Will check T4.  No obvious signs of hyperthyroidism at this time. Troponins elevated, EKG nonacute, echo reassuring.  Suspect demand ischemia secondary to flu.  Low TSH Plan as above  Hypertensive urgency  Blood pressure better now.  Continue lisinopril, metoprolol.  Fall at home [facility] Therapy consult  Elevated creatinine on admission 1.32 Consider AKI or CKD, last values from 2014. Down to 1.23 today.  BUN still elevated.  Continue hydration and repeat BMP in AM.  Macrocytic anemia  Thrombocytopenia Check B12 and folate Low platelets likely from flu.  CBC in AM.  Hyperlipidemia -- statin      Subjective:  Feels fine, breathing okay  Physical Exam: Vitals:   06/01/23 0911 06/01/23 1055 06/01/23 1321 06/01/23 1700  BP: (!) 163/67 127/63    Pulse: 82 64    Resp:  19    Temp:  97.6 F (36.4 C)    TempSrc:  Oral    SpO2:  96% 98% 93%  Weight:      Height:       Physical Exam Vitals reviewed.  Constitutional:      General: He is not in acute distress.    Appearance: He is not ill-appearing or toxic-appearing.  Cardiovascular:     Rate and Rhythm: Normal rate and regular  rhythm.     Heart sounds: No murmur heard. Pulmonary:     Effort: Pulmonary effort is normal. No respiratory distress.     Breath sounds: No wheezing, rhonchi or rales.  Neurological:     Mental Status: He is alert.  Psychiatric:        Mood and Affect: Mood normal.        Behavior: Behavior normal.     Data Reviewed: Troponins flat 101, 135, 116, 128 CMP notable for AST of 66 HDL 52 Platelets 118  Family Communication: none  Disposition: Status is: Observation      Time spent: 20 minutes  Author: Brendia Sacks, MD 06/01/2023 5:52 PM  For on call review [email or web address].

## 2023-06-01 NOTE — Plan of Care (Signed)
  Problem: Education: Goal: Knowledge of General Education information will improve Description: Including pain rating scale, medication(s)/side effects and non-pharmacologic comfort measures Outcome: Progressing   Problem: Clinical Measurements: Goal: Diagnostic test results will improve Outcome: Progressing   

## 2023-06-01 NOTE — Consult Note (Addendum)
 Cardiology Consultation   Patient ID: [name] MRN: [id number]; DOB: 08/22/1930  Admit date: 05/31/2023 Date of Consult: 06/01/2023  PCP:  Gaspar Garbe, MD   [facility] Providers Cardiologist:  Rollene Rotunda, MD   {  Patient Profile:   [name] is a 89 y.o. male with a hx of HTN, CAD with RCA occlusion and DES to OM 2012, dyslipidemia, BPH,  who is being seen 06/01/2023 for the evaluation of A fib and elevated troponin at the request of Dr Irene Limbo.  History of Present Illness:   Mr. Espinola with above PMH who presented to ER yesterday for multiple falls at home. He was not able to provide good history due to poor memory. Per ER and admission notes, there is limited history obtained so far.  Upon encounter, he states he is doing well, had some cough and SOB on and off for a while. He is surprised that he has the Flu. He states he does not go anywhere these days, mostly just sitting in the chair and watch TV during the day. He uses walker to ambulate but needs help with ADLs and felt he rarely walks these days due to decondition. He denied ever having any heart palpitation, irregular heart beat sensation, chest pain, dizziness, or syncope. He states he probably fells at lot at home but does not always remembers his falls. He states he takes all his medication for blood pressure and heart disease. He denied tobacco use. He asked that his son [name] to be called for update and wants his wife to be brought to the hospital.   Called his son [name], who called back later, confirms that patient falls a lot and has low functional status, walking from one room to another is the maximum activity he would do these days. He is planning on coming by with his mother. He agrees with conservative approach when it comes to managing his dad.   He was noted hypertensive with BP up to 222/75 at ED with low grade fever 100.3. Diagnostic at ER from 05/31/23 showed Cr 1.32, BUN 29, GFR 51. Hgb 11.5.  PLT 138k. Flu A +. UA + protein, ketone, and Hgb. Hs trop 100>135>116>128. TSH 0.297. Blood culture NTD.  CXR, CTH, CT C spine revealed no acute findings. EKG showed ventricular rare controlled A fib. He was admitted to hospitalist for Flu A infection, given steroid due to wheezing on exam, started on heparin gtt for anticoagulation due to new onset of A fib and CHA2DS2VASc score of 4. Echo is pending. Cardiology is consulted today for further evaluation of A fib and elevated troponin.   Per chart review, patient remotely followed Dr Antoine Poche, has a history of remote RCA occlusion documented in [place] in the 80s and it was medically managed. He underwent LHC on 12/22/10: dLM 30%, prox to mid LAD 30%, OM 95%, AV branches supplied collaterals to the PDA, RCA occluded, EF 65%. He required placement of a Promus DES to the OM. He had been doing well since from cardiac standpoint. He was last seen in the office 05/23/19 by Drema Pry, was continued on medical therapy with ASA 81mg , hydrochlorothiazide 25mg  daily, lisinopril 40mg  daily, metoprolol 25mg  BID, and simvastatin 80mg  daily for CAD/HTN. He has no known A fib in the past. There has been no recent Echo or ischemic evaluation over the past 10 years. [place] Exercise Treadmill test was requested on 2017 but he never completed it due to inability to walk  on the treadmill. No further workup done as he had no symptoms.    Past Medical History:  Diagnosis Date   Acute GI bleeding 10/30/2011   Anginal pain (HCC) 12/2010   Arthritis    "knees and left shoulder is completely shot"   BPH (benign prostatic hyperplasia)    CAD (coronary artery disease)    RCA occluded (Cath [place] 1980s);  LHC 12/22/10: dLM 30%, prox to mid LAD 30%, oOM 95%, AV branches supplied collats to PDA, RCA occluded, EF 65%.  He underwent placement of a Promus DES to the OM.   Carotid stenosis    0 - 39% bilateral   Diverticulosis    Exertional dyspnea 10/30/2011   GERD  (gastroesophageal reflux disease)    Glaucoma    Gout    Hyperlipidemia    Hypertension    Pneumonia 1950's    Past Surgical History:  Procedure Laterality Date   COLONOSCOPY     CORONARY ANGIOPLASTY WITH STENT PLACEMENT  12/2010   "1"   CYSTECTOMY     right cheek   EYE MUSCLE SURGERY  1953   right   TRANSURETHRAL RESECTION OF PROSTATE  ~ 2007     Home Medications:  Prior to Admission medications   Medication Sig Start Date End Date Taking? Authorizing Provider  allopurinol (ZYLOPRIM) 300 MG tablet Take 300 mg by mouth Daily.  09/11/10   [provider]  aspirin 81 MG tablet Take 81 mg by mouth daily.    [provider]  docusate sodium (COLACE) 100 MG capsule Take 100 mg by mouth daily as needed. For constipation    [provider]  finasteride (PROSCAR) 5 MG tablet Take 5 mg by mouth Daily.  09/12/10   [provider]  hydrochlorothiazide (HYDRODIURIL) 25 MG tablet Take 12.5-25 mg by mouth daily.    [provider]  ibuprofen (ADVIL,MOTRIN) 200 MG tablet Take 200 mg by mouth every 6 (six) hours as needed for pain.    [provider]  latanoprost (XALATAN) 0.005 % ophthalmic solution Place 1 drop into the left eye at bedtime. 04/14/19   [provider]  lisinopril (PRINIVIL,ZESTRIL) 40 MG tablet Take 1 tablet by mouth Daily.     [provider]  metoprolol tartrate (LOPRESSOR) 25 MG tablet TAKE 1 BY MOUTH TWICE DAILY 03/12/15   Rollene Rotunda, MD  nitroGLYCERIN (NITROSTAT) 0.4 MG SL tablet Place 1 tablet (0.4 mg total) under the tongue every 5 (five) minutes as needed. 04/07/16   Rollene Rotunda, MD  ranitidine (ZANTAC) 150 MG tablet Take 150 mg by mouth daily as needed. For heartburn    [provider]  simvastatin (ZOCOR) 80 MG tablet Take 80 mg by mouth at bedtime.    [provider]  Tamsulosin HCl (FLOMAX) 0.4 MG CAPS Take 0.4 mg by mouth Daily.     [provider]  timolol  (TIMOPTIC) 0.5 % ophthalmic solution Place 1 drop into the left eye 2 (two) times daily. 04/05/18   [provider]    Inpatient Medications: Scheduled Meds:  aspirin EC  81 mg Oral Daily   atorvastatin  40 mg Oral Daily   ipratropium-albuterol  3 mL Nebulization Q4H   lisinopril  40 mg Oral Daily   methylPREDNISolone (SOLU-MEDROL) injection  40 mg Intravenous BID   metoprolol tartrate  25 mg Oral BID   oseltamivir  30 mg Oral BID   Continuous Infusions:  heparin 1,000 Units/hr (06/01/23 0617)   PRN Meds:  hydrALAZINE  Allergies:   No Known Allergies  Social History:   Social History   Socioeconomic History   Marital status: Married    Spouse name: Not on file   Number of children: 3   Years of education: Not on file   Highest education level: Not on file  Occupational History   Occupation: Retired  Tobacco Use   Smoking status: Former    Current packs/day: 0.00    Average packs/day: 3.0 packs/day for 14.0 years (42.0 ttl pk-yrs)    Types: Cigarettes    Start date: 12/11/1952    Quit date: 12/12/1966    Years since quitting: 56.5   Smokeless tobacco: Never  Vaping Use   Vaping status: Never Used  Substance and Sexual Activity   Alcohol use: Yes    Comment: 10/30/2011 "stopped all alcohol 2007; used to drink too much"   Drug use: No   Sexual activity: Never  Other Topics Concern   Not on file  Social History Narrative   Not on file   Social Drivers of Health   Financial Resource Strain: Not on file  Food Insecurity: No Food Insecurity (06/01/2023)   Hunger Vital Sign    Worried About Running Out of Food in the Last Year: Never true    Ran Out of Food in the Last Year: Never true  Transportation Needs: No Transportation Needs (06/01/2023)   PRAPARE - Administrator, Civil Service (Medical): No    Lack of Transportation (Non-Medical): No  Physical Activity: Not on file  Stress: Not on file  Social Connections: Unknown (06/01/2023)   Social  Connection and Isolation Panel [NHANES]    Frequency of Communication with Friends and Family: Three times a week    Frequency of Social Gatherings with Friends and Family: Patient unable to answer    Attends Religious Services: Patient unable to answer    Active Member of Clubs or Organizations: Patient unable to answer    Attends Banker Meetings: Patient unable to answer    Marital Status: Patient unable to answer  Intimate Partner Violence: Not At Risk (06/01/2023)   Humiliation, Afraid, Rape, and Kick questionnaire    Fear of Current or Ex-Partner: No    Emotionally Abused: No    Physically Abused: No    Sexually Abused: No    Family History:    Family History  Problem Relation Age of Onset   Prostate cancer Father 9   Cancer Mother        Stomach   Colon cancer Neg Hx      ROS:  Constitutional: Denied fever, chills, malaise, night sweats Eyes: Denied vision change or loss Ears/Nose/Mouth/Throat: cough Cardiovascular: denied chest pain/pressure Respiratory: SOB and cough  Gastrointestinal: Denied nausea, vomiting, abdominal pain, diarrhea Genital/Urinary: Denied dysuria, hematuria, urinary frequency/urgency Musculoskeletal:global weakness  Skin: Denied rash, wound Neuro: memory loss  Psych: Denied history of depression/anxiety  Endocrine: Denied history of diabetes      Physical Exam/Data:   Vitals:   06/01/23 0552 06/01/23 0553 06/01/23 0659 06/01/23 0753  BP:  (!) 162/69 (!) 164/67   Pulse:  86    Resp:  20    Temp:  98 F (36.7 C)    TempSrc:  Oral    SpO2:  94%  96%  Weight: 78.5 kg     Height:  5\' 8"  (1.727 m)      Intake/Output Summary (Last 24 hours) at 06/01/2023 0849 Last data filed at 06/01/2023  9147 Gross per 24 hour  Intake 1600.87 ml  Output 550 ml  Net 1050.87 ml      06/01/2023    5:52 AM 04/28/2018    3:15 PM 04/13/2017    9:28 AM  Last 3 Weights  Weight (lbs) 173 lb 1 oz 185 lb 9.6 oz 180 lb  Weight (kg) 78.5 kg 84.188 kg  81.647 kg     Body mass index is 26.31 kg/m.   Vitals:  Vitals:   06/01/23 0659 06/01/23 0753  BP: (!) 164/67   Pulse:    Resp:    Temp:    SpO2:  96%   General Appearance: In no apparent distress, laying in bed HEENT: Normocephalic, atraumatic.  Neck: Supple, trachea midline, no JVDs Cardiovascular: Irregularly irregular,  normal S1-S2,  no murmur Respiratory: Resting breathing unlabored, lungs sounds clear to auscultation bilaterally, no use of accessory muscles. On 2LNC. Speaks full sentence. Occasional moist cough  Gastrointestinal: Bowel sounds positive, abdomen soft, non-tender Extremities: Able to move all extremities in bed without difficulty, no edema of BLE Musculoskeletal: Normal muscle bulk and tone Skin: Intact, warm, dry. Large ecchymosis of R arm/elbow noted  Neurologic: Alert, oriented to person, place and time. No cognitive deficit, Memory loss. No gross focal deficit  Psychiatric: Normal affect. Mood is appropriate.     EKG:  The EKG was personally reviewed and demonstrates:    EKG from 05/31/23 showed A fib with ventricular rate of 69bpm, TWI of lateral leads, LVH, artifacts   EKG from 06/01/23 showed A fib with ventricular rate of 62bpm, TWI of lateral leads, LVH, artifacts   Telemetry:  Telemetry was personally reviewed and demonstrates:    A fib with ventricular rate of 70s, frequent PVCs  Relevant CV Studies:  Echo pending   LHC on 12/23/10:  Left main coronary artery had a tapering 30% distal stenosis.   Left anterior descending artery was calcified proximally.  There were 30% multiple lesions in the proximal and mid vessel.  The first diagonal branch was a small vessel and normal.  Second diagonal branch was a medium-sized vessel and normal.  The LAD wrapped the apex.   The circumflex coronary artery was nondominant.   There was calcification in the proximal vessel.   There was a very large first obtuse marginal branch which had a  95% ostial stenosis.  This was heavily involved with the calcification.  The AV groove branch supplied collaterals to the PDA, posterior lateral branch.  The distal right coronary artery did not fill.   The right coronary artery was 100% occluded distally which is known since 1999.   RAO ventriculography.  RAO ventriculography was normal.  EF was 65%. There is no gradient across the aortic valve and no MR.  An LV pressure was 157/17.  Aortic pressure is 157/73.  Laboratory Data:  High Sensitivity Troponin:   Recent Labs  Lab 05/31/23 1410 05/31/23 1622 06/01/23 0016 06/01/23 0409  TROPONINIHS 100* 135* 116* 128*     Chemistry Recent Labs  Lab 05/31/23 1410 06/01/23 0409  NA 137 134*  K 3.7 3.5  CL 102 102  CO2 25 22  GLUCOSE 127* 119*  BUN 29* 26*  CREATININE 1.32* 1.23  CALCIUM 9.3 9.0  MG 1.7  --   GFRNONAA 51* 55*  ANIONGAP 10 10    Recent Labs  Lab 05/31/23 1410 06/01/23 0409  PROT 6.2* 5.8*  ALBUMIN 3.5 3.2*  AST 43* 66*  ALT 16 19  ALKPHOS  62 55  BILITOT 0.8 1.1   Lipids No results for input(s): "CHOL", "TRIG", "HDL", "LABVLDL", "LDLCALC", "CHOLHDL" in the last 168 hours.  Hematology Recent Labs  Lab 05/31/23 1410 06/01/23 0409  WBC 7.3 8.8  RBC 3.65* 3.60*  HGB 11.5* 11.5*  HCT 36.0* 37.2*  MCV 98.6 103.3*  MCH 31.5 31.9  MCHC 31.9 30.9  RDW 14.9 15.0  PLT 138* 118*   Thyroid  Recent Labs  Lab 06/01/23 0016  TSH 0.297*    BNPNo results for input(s): "BNP", "PROBNP" in the last 168 hours.  DDimer No results for input(s): "DDIMER" in the last 168 hours.   Radiology/Studies:  CT CERVICAL SPINE WO CONTRAST Result Date: 05/31/2023 CLINICAL DATA:  Polytrauma, blunt.  Multiple falls. EXAM: CT CERVICAL SPINE WITHOUT CONTRAST TECHNIQUE: Multidetector CT imaging of the cervical spine was performed without intravenous contrast. Multiplanar CT image reconstructions were also generated. RADIATION DOSE REDUCTION: This exam was performed  according to the departmental dose-optimization program which includes automated exposure control, adjustment of the mA and/or kV according to patient size and/or use of iterative reconstruction technique. COMPARISON:  None Available. FINDINGS: Alignment: Normal Skull base and vertebrae: No acute fracture. No primary bone lesion or focal pathologic process. Soft tissues and spinal canal: No prevertebral fluid or swelling. No visible canal hematoma. Disc levels: Diffuse advanced degenerative disc disease with fusion across the C5-6 and C6-7 disc spaces. Advanced bilateral degenerative facet disease with fusion diffusely. Upper chest: No acute findings Other: None IMPRESSION: Diffuse advanced degenerative disc disease and facet disease. No acute bony abnormality. Electronically Signed   By: Charlett Nose M.D.   On: 05/31/2023 17:23   CT HEAD WO CONTRAST Result Date: 05/31/2023 CLINICAL DATA:  Head trauma, moderate-severe.  Multiple falls. EXAM: CT HEAD WITHOUT CONTRAST TECHNIQUE: Contiguous axial images were obtained from the base of the skull through the vertex without intravenous contrast. RADIATION DOSE REDUCTION: This exam was performed according to the departmental dose-optimization program which includes automated exposure control, adjustment of the mA and/or kV according to patient size and/or use of iterative reconstruction technique. COMPARISON:  None Available. FINDINGS: Brain: There is atrophy and chronic small vessel disease changes. No acute intracranial abnormality. Specifically, no hemorrhage, hydrocephalus, mass lesion, acute infarction, or significant intracranial injury. Vascular: No hyperdense vessel or unexpected calcification. Skull: No acute calvarial abnormality. Sinuses/Orbits: No acute findings Other: None IMPRESSION: Atrophy, chronic microvascular disease. No acute intracranial abnormality. Electronically Signed   By: Charlett Nose M.D.   On: 05/31/2023 17:21   DG Pelvis Portable Result  Date: 05/31/2023 CLINICAL DATA:  Multiple mechanical falls EXAM: PORTABLE PELVIS 1-2 VIEWS COMPARISON:  None Available. FINDINGS: Supine frontal view of the pelvis includes both hips. No acute displaced fracture, subluxation, or dislocation. Mild symmetrical bilateral hip osteoarthritis. Severe lower lumbar degenerative changes greatest at L4-5 and L5-S1. Soft tissues are unremarkable. IMPRESSION: 1. No acute pelvic fracture. 2. Degenerative changes of the lower lumbar spine and bilateral hips. Electronically Signed   By: Sharlet Salina M.D.   On: 05/31/2023 15:40   DG Chest Port 1 View Result Date: 05/31/2023 CLINICAL DATA:  Questionable sepsis EXAM: PORTABLE CHEST 1 VIEW COMPARISON:  Right shoulder x-ray 06/20/2011. FINDINGS: Single frontal view of the chest demonstrates an unremarkable cardiac silhouette. Chronic elevation of the right hemidiaphragm. No acute airspace disease, effusion, or pneumothorax. No acute bony abnormalities. IMPRESSION: 1. No acute intrathoracic process. Electronically Signed   By: Sharlet Salina M.D.   On: 05/31/2023 15:39  Assessment and Plan:   A fib with controlled ventricular rate, new diagnosis - no known hx in the past, unknown chronicity,  in the setting of Flu A infection,  asymptomatic, ventricular rate controlled, on PTA metoprolol 25mg  BID, will continue for rate control  - CHA2DS2-VASc Score = 3 , ideally recommend anticoagulation for CVA prophylaxis, however, given his history of frequent falls, SDH noted on CTH in 2014, memory deficit, advanced age, HAS BLED score is 5,  he is at high risk of bleeding/trauma, favor no anticoagulation at this time, discussed this matter with patient and his son per his request, defer further discussion to MD with patient and family to make mutual decision on anticoagulation  - Echo pending, will follow - TSH low, need to rule out hyperthyroidism, defer further workup to primary team  - Keep Mag >2 and K >4 please   CAD with  CTO of RCA and OM DES 2012 Elevated troponin  - Hs trop 100 >135 >116 > 128 - EKG with non-specific lateral T wave abnormalities  - Echo pending - suspect demand ischemic from HTN urgency, Flu A infection - given his lacking of angina symptoms, advanced age, poor memory and low baseline functional status, favor conservative medical management for CAD  - continue PTA ASA 81mg , simvastatin 80mg , metoprolol 25mg  BID, and lisinopril 40mg  daily  - will check lipid panel and A1C for optimizing medical therapy   HTN urgency  - he states he was compliant with his medications - continue PTA metoprolol 25mg  BID, lisinopril 40mg , would stop PTA hydrochlorothiazide 25mg , start amlodipine 5mg  daily if BP needs   Acute hypoxic respiratory failure  Flu A Falls AKI versus CKD  Anemia  HLD - per primary team     Risk Assessment/Risk Scores:    CHA2DS2-VASc Score = 3  This indicates a 3.2% annual risk of stroke. The patient's score is based upon: CHF History: 0 HTN History: 1 Diabetes History: 0 Stroke History: 0 Vascular Disease History: 0 Age Score: 2 Gender Score: 0     For questions or updates, please contact [facility] Please consult [email or web address] for contact info under    Signed, Cyndi Bender, NP  06/01/2023 8:49 AM  Patient seen and examined with Cyndi Bender NP.  Agree as above, with the following exceptions and changes as noted below.  Pleasant 89 year old gentleman currently admitted for influenza A and bronchitis found to have atrial fibrillation and an elevated troponin.  Patient is alone in the room and family not present.  The patient is high risk for bleeding given multiple falls and prior subdural hematoma, and a suboptimal candidate for anticoagulation in the setting of atrial fibrillation.  I will discuss this further with the patient's son and his wife on rounds tomorrow, this has been addressed preliminarily by NP as noted above. Gen: NAD, CV: iRRR, no murmurs,  Lungs: Coarse bilaterally, Abd: soft, Extrem: Warm, well perfused, no edema, Neuro/Psych: alert and oriented x 3, normal mood and affect. All available labs, radiology testing, previous records reviewed.  Patient will need time to convalesce from bronchitis and the flu which is likely driving his demand ischemia as well as his new diagnosis of atrial fibrillation.  Follow-up echocardiogram which is pending.  Can continue metoprolol at home dosing 25 mg twice daily for now, patient appears hemodynamically stable and not septic.  Would recommend medical management of elevated troponin given above-mentioned frailty and lack of angina, agree with continuing aspirin 81 mg daily, statin.  For elevated blood pressure patient is taking lisinopril 40 mg daily, agree as above reasonable to consider amlodipine.  Parke Poisson, MD 06/01/23 3:50 PM

## 2023-06-01 NOTE — Hospital Course (Addendum)
 89 year old man presented after a fall at home.  PMH CAD, PCI.  Evaluation revealed influenza A, atrial fibrillation rate controlled.  Patient admitted for flu and new A-fib. Continue with Tamiflu and steroid, initially received heparin drip but stopped due to coffee-ground emesis underwent endoscopy-Multiple superficial duodenal ulcers-managed with PPI. Felt to be a poor candidate for anticoagulation due to his risk of falls. Patient had 4.3-second pause, A-fib with slow ventricular response rate transferred to stepdown metoprolol discontinued following meals heart rate is stabilized.  He also had delirium in the setting of postanesthesia hospitalization-which subsequently resolved He remains weak and deconditioned and planning for skilled nursing facility.  Consultants Cardiology  Gastroenterology  Procedures/Events EGD 4/4> multiple superficial duodenal ulcers without stigmata likely secondary to NSAIDs, gastric biopsies taken to rule out H. pylori 200 e. incidentally cbd stricture.>GI advised PPI 40 twice daily x 8 weeks then daily avoid NSAIDs follow-up biopsy result and okay to resume anticoagulation in 48 hours.  Subjective: Seen and examined this morning He is alert awake resting comfortably communicative mentation better Blood pressure high improving after hydralazine Overnight BP hypertensive meds adjusted Labs with improved creatinine 1.1  Assessment and plan:  Acute bronchitis with influenza A infection: Resp status stable. Completed Tamiflu. Taper off prednisone   New onset A-fib  Elevated troponin-from demand ischemia 4.3-second pause/A-fib with slow ventricular response rate with heart rate in 30s on 06/04/23 PM: PTA on metoprolol 25 twice daily.Echo reassuring.  Seen by Cardiology> metoprolol increased-but subsequently discontinued 4/4 evening due to pauses and bradycardia-heart rate now maintained after beta-blocker washout. Seen by cardiology no further recommendation.   TSH depressed but normal FT4-will need follow-up in 3 to 4 weeks. Not a ideal candidate for anticoagulation for long-term per cardiology due to fall risk.  Acute metabolic /toxic encephalopathy: Patient agitated and confused 4/4 night - multifactorial in the setting of advanced age, complex co-morbidities, and likely from anesthesia for EGD.At baseline he is aaox3 and no memory issues-sometime forgets day of the week.   Mentation has improved.  Keep mittens off for SNF. UA W/O UTI  Coffee-ground emesis ABLA Multiple superficial duodenal ulcers Leukocytosis 18k-resolving: Seen by GI- heparin has been discontinued 4/3> underwent EGD with finding as above with multiple superficial duodenal ulcers hiatal hernia.  Cannot do PPI twice daily x 8 weeks then daily. S/ p 1 unit PRBC and hb up in 8 Recent Labs  Lab 06/04/23 1155 06/04/23 1412 06/04/23 1923 06/05/23 0246 06/06/23 0244  HGB 8.8* 9.5* 7.7* 7.5* 8.0*  HCT 28.8* 28.0* 24.9* 23.7* 25.0*    Macrocytic anemia  Thrombocytopenia: B12 folate level stable.Monitor CBC-platelet trending up.   Low TSH: Normal free T4,monitor as outpatient  Hypokalemia: Replaced.  Hypertensive urgency:  BP borderline controlled-hydralazine was added 4/7.  Continue lisinopril. off metoprolol due to bradycardia. Monitor BP and adjust meds   Fall at home PMH  of SDH: Continue PT OT and planning for SNF at discharge   AKI on CKD 3a Elevated creatinine on admission 1.32: last values from 2014 likely has baseline CKD.Level fluctuating from 1.2-1.4 here- suspect CKD stage IIIa - creat peaked to 1.7 indicating some component of AKI. Given gentle IV fluid hydration  and creat down in 1.1 Recent Labs    05/31/23 1410 06/01/23 0409 06/02/23 0445 06/03/23 0104 06/04/23 0348 06/04/23 1412 06/05/23 0246 06/06/23 0244 06/07/23 0254  BUN 29* 26* 50* 88* 87* 86* 73* 60* 49*  CREATININE 1.32* 1.23 1.44* 1.41* 1.20 1.70* 1.41* 1.36* 1.18  CO2 25  22 24 21*  20*  --  22 24 23   K 3.7 3.5 3.1* 4.2 3.9 3.7 3.8 3.5 3.4*    Hyperlipidemia: Continue statin  Deconditioning/debility: Planning for discharge to skilled nursing facility once off mitten x 24 hours

## 2023-06-02 DIAGNOSIS — I2510 Atherosclerotic heart disease of native coronary artery without angina pectoris: Secondary | ICD-10-CM | POA: Diagnosis not present

## 2023-06-02 DIAGNOSIS — I160 Hypertensive urgency: Secondary | ICD-10-CM | POA: Diagnosis not present

## 2023-06-02 DIAGNOSIS — I4819 Other persistent atrial fibrillation: Secondary | ICD-10-CM | POA: Diagnosis not present

## 2023-06-02 DIAGNOSIS — I4891 Unspecified atrial fibrillation: Secondary | ICD-10-CM | POA: Diagnosis not present

## 2023-06-02 LAB — BASIC METABOLIC PANEL WITH GFR
Anion gap: 9 (ref 5–15)
BUN: 50 mg/dL — ABNORMAL HIGH (ref 8–23)
CO2: 24 mmol/L (ref 22–32)
Calcium: 8.7 mg/dL — ABNORMAL LOW (ref 8.9–10.3)
Chloride: 100 mmol/L (ref 98–111)
Creatinine, Ser: 1.44 mg/dL — ABNORMAL HIGH (ref 0.61–1.24)
GFR, Estimated: 46 mL/min — ABNORMAL LOW (ref >60)
Glucose, Bld: 141 mg/dL — ABNORMAL HIGH (ref 70–99)
Potassium: 3.1 mmol/L — ABNORMAL LOW (ref 3.5–5.1)
Sodium: 133 mmol/L — ABNORMAL LOW (ref 135–145)

## 2023-06-02 LAB — T4, FREE: Free T4: 1.01 ng/dL (ref 0.61–1.12)

## 2023-06-02 LAB — HEPARIN LEVEL (UNFRACTIONATED)
Heparin Unfractionated: 0.62 [IU]/mL (ref 0.30–0.70)
Heparin Unfractionated: 0.64 [IU]/mL (ref 0.30–0.70)

## 2023-06-02 LAB — CBC
HCT: 31.5 % — ABNORMAL LOW (ref 39.0–52.0)
Hemoglobin: 10.3 g/dL — ABNORMAL LOW (ref 13.0–17.0)
MCH: 31.6 pg (ref 26.0–34.0)
MCHC: 32.7 g/dL (ref 30.0–36.0)
MCV: 96.6 fL (ref 80.0–100.0)
Platelets: 132 10*3/uL — ABNORMAL LOW (ref 150–400)
RBC: 3.26 MIL/uL — ABNORMAL LOW (ref 4.22–5.81)
RDW: 14.7 % (ref 11.5–15.5)
WBC: 13.8 10*3/uL — ABNORMAL HIGH (ref 4.0–10.5)
nRBC: 0 % (ref 0.0–0.2)

## 2023-06-02 LAB — VITAMIN B12: Vitamin B-12: 626 pg/mL (ref 180–914)

## 2023-06-02 LAB — FOLATE: Folate: >40 ng/mL (ref >5.9)

## 2023-06-02 MED ORDER — METOPROLOL TARTRATE 25 MG PO TABS
37.5000 mg | ORAL_TABLET | Freq: Two times a day (BID) | ORAL | Status: DC
  Administered 2023-06-02 – 2023-06-04 (×2): 37.5 mg via ORAL
  Filled 2023-06-02 (×3): qty 2

## 2023-06-02 MED ORDER — HALOPERIDOL LACTATE 5 MG/ML IJ SOLN: 1.0000 mg | Freq: Once | INTRAMUSCULAR | Status: DC

## 2023-06-02 MED ORDER — LATANOPROST 0.005 % OP SOLN
1.0000 [drp] | Freq: Every day | OPHTHALMIC | Status: DC
  Administered 2023-06-02 – 2023-06-07 (×6): 1 [drp] via OPHTHALMIC
  Filled 2023-06-02: qty 2.5

## 2023-06-02 MED ORDER — LATANOPROST 0.005 % OP SOLN
1.0000 [drp] | Freq: Every day | OPHTHALMIC | Status: AC
  Administered 2023-06-02: 1 [drp] via OPHTHALMIC
  Filled 2023-06-02: qty 2.5

## 2023-06-02 MED ORDER — POTASSIUM CHLORIDE CRYS ER 20 MEQ PO TBCR
40.0000 meq | EXTENDED_RELEASE_TABLET | Freq: Once | ORAL | Status: AC
  Administered 2023-06-02: 40 meq via ORAL
  Filled 2023-06-02: qty 2

## 2023-06-02 MED ORDER — TIMOLOL MALEATE 0.5 % OP SOLN
1.0000 [drp] | Freq: Two times a day (BID) | OPHTHALMIC | Status: DC
Start: 2023-06-02 — End: 2023-06-08
  Administered 2023-06-02 – 2023-06-08 (×12): 1 [drp] via OPHTHALMIC
  Filled 2023-06-02: qty 5

## 2023-06-02 MED ORDER — MELATONIN 5 MG PO TABS
5.0000 mg | ORAL_TABLET | Freq: Every evening | ORAL | Status: AC | PRN
  Administered 2023-06-02 – 2023-06-05 (×3): 5 mg via ORAL
  Filled 2023-06-02 (×4): qty 1

## 2023-06-02 MED ORDER — PANTOPRAZOLE SODIUM 40 MG IV SOLR
40.0000 mg | Freq: Two times a day (BID) | INTRAVENOUS | Status: DC
  Administered 2023-06-03 – 2023-06-07 (×10): 40 mg via INTRAVENOUS
  Filled 2023-06-02 (×10): qty 10

## 2023-06-02 MED ORDER — HEPARIN (PORCINE) 25000 UT/250ML-% IV SOLN
850.0000 [IU]/h | INTRAVENOUS | Status: DC
  Administered 2023-06-02: 850 [IU]/h via INTRAVENOUS

## 2023-06-02 NOTE — Evaluation (Signed)
 Occupational Therapy Evaluation Patient Details Name: [name] MRN: [id number] DOB: 10-Aug-1930 Today's Date: 06/02/2023   History of Present Illness   Mr. Magnussen is a 89 yr old male admitted with a recent fall and was found to have flu, acute renal failure, and hypertensive urgency. PMH: CAD, BPH, gout, TURP     Clinical Impressions The pt is currently presenting below his baseline level of functioning for self-care management, as he is limited by the below listed deficits (see OT problem list). He currently requires assist for functional tasks, including dressing, toileting, and sit to stand using a RW. During the session today, he was noted to be with unsteadiness in standing, mild deconditioning, and he also reported feelings of generalized weakness. Without further OT services, he is at high risk for further falls, restricted ADL participation, and progressive weakness. Patient will benefit from continued inpatient follow up therapy, <3 hours/day.        If plan is discharge home, recommend the following:   Assist for transportation;Assistance with cooking/housework;Help with stairs or ramp for entrance;A lot of help with bathing/dressing/bathroom;A little help with walking and/or transfers     Functional Status Assessment   Patient has had a recent decline in their functional status and demonstrates the ability to make significant improvements in function in a reasonable and predictable amount of time.     Equipment Recommendations   Other (comment) (defer to next level of care)     Recommendations for Other Services         Precautions/Restrictions   Precautions Precautions: Fall Restrictions Weight Bearing Restrictions Per Provider Order: No     Mobility Bed Mobility Overal bed mobility: Needs Assistance Bed Mobility: Supine to Sit, Sit to Supine     Supine to sit: HOB elevated, Used rails, Contact guard Sit to supine: Contact guard assist         Transfers Overall transfer level: Needs assistance Equipment used: Rolling walker (2 wheels) Transfers: Sit to/from Stand Sit to Stand: Min assist, From elevated surface                  ADL either performed or assessed with clinical judgement   ADL Overall ADL's : Needs assistance/impaired Eating/Feeding: Set up;Sitting   Grooming: Sitting;Set up Grooming Details (indicate cue type and reason): simulated EOB         Upper Body Dressing : Minimal assistance;Sitting   Lower Body Dressing: Maximal assistance Lower Body Dressing Details (indicate cue type and reason): for sock management seated EOB                     Vision   Additional Comments: He correctly read the time depicted on the wall clock.            Pertinent Vitals/Pain Pain Assessment Pain Assessment: No/denies pain     Extremity/Trunk Assessment Upper Extremity Assessment Upper Extremity Assessment: Right hand dominant;RUE deficits/detail RUE Deficits / Details: Chronic shoulder AROM limitations, with AROM for shoulder flexion being <90 degrees. Functional grip strength bilaterally   Lower Extremity Assessment Lower Extremity Assessment: Generalized weakness       Communication Communication Communication: No apparent difficulties Factors Affecting Communication: Hearing impaired   Cognition Arousal: Alert Behavior During Therapy: WFL for tasks assessed/performed               OT - Cognition Comments: Oriented x4, able to follow 1-2 step commands consistently  Home Living Family/patient expects to be discharged to:: Private residence Living Arrangements: Spouse/significant other Available Help at Discharge: Other (Comment) (Has a supportive son locally) Type of Home: House Home Access: Stairs to enter [employer] of Steps: 3-4   Home Layout: One level     Bathroom Shower/Tub: Tub/shower unit         Home Equipment:  Agricultural consultant (2 wheels);Cane - single point          Prior Functioning/Environment Prior Level of Function : Independent/Modified Independent             Mobility Comments:  (He uses a RW for household ambulation. He reported going outside the home minimally.) ADLs Comments:  (He reported being modified independent to independent with ADLs, he does not drive, and his spouse performs most of the cooking and cleaning. He takes spongebaths.)    OT Problem List: Decreased strength;Decreased range of motion;Impaired balance (sitting and/or standing);Decreased knowledge of use of DME or AE   OT Treatment/Interventions: Self-care/ADL training;Energy conservation;Balance training;Patient/family education;Therapeutic activities;Therapeutic exercise      OT Goals(Current goals can be found in the care plan section)   Acute Rehab OT Goals Patient Stated Goal: to get better OT Goal Formulation: With patient Time For Goal Achievement: 06/16/23 Potential to Achieve Goals: Good ADL Goals Pt Will Perform Upper Body Dressing: with set-up;sitting Pt Will Perform Lower Body Dressing: with supervision;sit to/from stand;sitting/lateral leans Pt Will Transfer to Toilet: with supervision;ambulating Pt Will Perform Toileting - Clothing Manipulation and hygiene: with supervision;sit to/from stand   OT Frequency:  Min 1X/week       AM-PAC OT "6 Clicks" Daily Activity     Outcome Measure Help from another person eating meals?: None Help from another person taking care of personal grooming?: A Little Help from another person toileting, which includes using toliet, bedpan, or urinal?: A Lot Help from another person bathing (including washing, rinsing, drying)?: A Lot Help from another person to put on and taking off regular upper body clothing?: A Little Help from another person to put on and taking off regular lower body clothing?: A Lot 6 Click Score: 16   End of Session Equipment Utilized  During Treatment: Rolling walker (2 wheels) Nurse Communication: Mobility status  Activity Tolerance: Other (comment) (Fair+ tolerance) Patient left: in bed;with call bell/phone within reach;with bed alarm set  OT Visit Diagnosis: Unsteadiness on feet (R26.81);Muscle weakness (generalized) (M62.81);History of falling (Z91.81)                Time: 1610-9604 OT Time Calculation (min): 22 min Charges:  OT General Charges $OT Visit: 1 Visit OT Evaluation $OT Eval Moderate Complexity: 1 Mod    Chidubem Chaires L Nikolus Marczak, OTR/L 06/02/2023, 5:29 PM

## 2023-06-02 NOTE — Progress Notes (Signed)
    Patient Name: [name]           DOB: [birth year]  MRN: [id number]      Admission Date: 05/31/2023  Attending Provider: Lanae Boast, MD  Primary Diagnosis: Atrial fibrillation [facility])   Level of care: Telemetry    CROSS COVER NOTE   Date of Service   06/02/2023   Fatih Stalvey, 89 y.o. male, was admitted on 05/31/2023 for Atrial fibrillation (HCC).    HPI/Events of Note   Upper GI Bleed??- Notified of pt having "dark stool and coffee ground emesis." Hx lower GI bleed, GERD, hemorrhoids. No bright red rectal bleeding. No hematemesis. Hemodynamically stable.  Pt currently on heparin gtt for new onset afib. Per cardiology, patient high risk for bleeding/trauma and is not an ideal candidate for long- term oral anticoagulation. Heparin gtt has been stopped at this time.  Prior hgb 10.3. Will recheck.  Secure chat sent to [facility]. Day team to follow on consult.   Bedside Assessment:  Patient is awake, A/O x2, with no associated distress.  Denies discomfort or pain at this time. Denies chest pain, SOB, abdominal pain, N/V.   Respiratory: Bilaterally clear, no wheezing, no crackles. Normal effort. No accessory muscle use.  Cardiovascular: Regular rate and rhythm. No extremity edema.  Abdomen: Abdomen is soft and nontender.  Positive bowel sounds in all quadrants.    Addendum: No further episodes of melena or coffee-ground emesis.   Interventions/ Plan   CBC Type and screen FOBT IV protonix        Anthoney Harada, DNP, ACNPC- AG [facility] [place]

## 2023-06-02 NOTE — [place] FL2 (Signed)
 [place] MEDICAID FL2 LEVEL OF CARE FORM     IDENTIFICATION  Patient Name: [name] Birthdate: [birth year] Sex: male Admission Date (Current Location): 05/31/2023  [facility] and [place] Number:  Producer, television/film/video and Address:  [facility],  [address], [place] [address]      Provider Number: [id number]  Attending Physician Name and Address:  Lanae Boast, MD  Relative Name and Phone Number:  Arvid, [place] [phone])  [phone] (Mobile    Current Level of Care: Hospital Recommended Level of Care: Skilled Nursing Facility Prior Approval Number:    Date Approved/Denied:   PASRR Number: [id number]  Discharge Plan: SNF    Current Diagnoses: Patient Active Problem List   Diagnosis Date Noted   Low TSH level 06/01/2023   Atrial fibrillation (HCC) 05/31/2023   Fall at home, initial encounter 05/31/2023   Elevated troponin 05/31/2023   Influenza A 05/31/2023   Acute bronchitis 05/31/2023   ARF (acute renal failure) (HCC) 05/31/2023   Thrombocytopenia (HCC) 05/31/2023   Pain due to onychomycosis of toenails of both feet 11/07/2019   Subdural hematoma (HCC) 08/16/2012   Hemorrhoids, internal, with bleeding 10/31/2011   Lower GI bleed 10/30/2011   HTN (hypertension) 12/17/2010   Dyslipidemia 12/17/2010   CAD S/P percutaneous coronary angioplasty    Carotid stenosis     Orientation RESPIRATION BLADDER Height & Weight     Self  Normal Incontinent Weight: 173 lb 1 oz (78.5 kg) Height:  5\' 8"  (172.7 cm)  BEHAVIORAL SYMPTOMS/MOOD NEUROLOGICAL BOWEL NUTRITION STATUS      Continent Diet (heart)  AMBULATORY STATUS COMMUNICATION OF NEEDS Skin   Limited Assist Verbally Other (Comment) (Irriant Deratitis groin)                       Personal Care Assistance Level of Assistance  Bathing, Feeding, Dressing Bathing Assistance: Limited assistance Feeding assistance: Independent Dressing Assistance: Limited assistance     Functional Limitations Info   Sight, Hearing, Speech Sight Info: Adequate Hearing Info: Adequate Speech Info: Adequate    SPECIAL CARE FACTORS FREQUENCY  PT (By licensed PT), OT (By licensed OT)     PT Frequency: 5 x a week OT Frequency: 5 x a week            Contractures Contractures Info: Not present    Additional Factors Info  Code Status, Allergies Code Status Info: full Allergies Info: Clobetasol Propionate           Current Medications (06/02/2023):  This is the current hospital active medication list Current Facility-Administered Medications  Medication Dose Route Frequency Provider Last Rate Last Admin   aspirin EC tablet 81 mg  81 mg Oral Daily Eduard Clos, MD   81 mg at 06/02/23 0824   atorvastatin (LIPITOR) tablet 40 mg  40 mg Oral Daily Eduard Clos, MD   40 mg at 06/02/23 0824   heparin ADULT infusion 100 units/mL (25000 units/272mL)  850 Units/hr Intravenous Continuous Weston Brass A, MD 8.5 mL/hr at 06/02/23 0513 850 Units/hr at 06/02/23 0513   hydrALAZINE (APRESOLINE) injection 5 mg  5 mg Intravenous Q4H PRN Eduard Clos, MD   5 mg at 06/01/23 0659   ipratropium-albuterol (DUONEB) 0.5-2.5 (3) MG/3ML nebulizer solution 3 mL  3 mL Nebulization Q4H Eduard Clos, MD   3 mL at 06/02/23 0759   lactated ringers infusion   Intravenous Continuous Standley Brooking, MD 75 mL/hr at 06/02/23 3244 New Bag at  06/02/23 0823   lisinopril (ZESTRIL) tablet 40 mg  40 mg Oral Daily Eduard Clos, MD   40 mg at 06/02/23 6213   metoprolol tartrate (LOPRESSOR) tablet 25 mg  25 mg Oral BID Eduard Clos, MD   25 mg at 06/02/23 0865   oseltamivir (TAMIFLU) capsule 30 mg  30 mg Oral BID Eduard Clos, MD   30 mg at 06/02/23 0825   predniSONE (DELTASONE) tablet 40 mg  40 mg Oral Q breakfast Standley Brooking, MD   40 mg at 06/02/23 0825     Discharge Medications: Please see discharge summary for a list of discharge medications.  Relevant Imaging  Results:  Relevant Lab Results:   Additional Information [id number]  Valentina Shaggy Deontrae Drinkard, LCSW

## 2023-06-02 NOTE — TOC Progression Note (Signed)
 Transition of Care [facility]) - Progression Note    Patient Details  Name: [name] MRN: [id number] Date of Birth: November 07, 1930  Transition of Care [facility]) CM/SW Contact  Larrie Kass, LCSW Phone Number: 06/02/2023, 1:27 PM  Clinical Narrative:     Bed offers emailed to pt's son for review. TOC to follow.   Expected Discharge Plan: Skilled Nursing Facility Barriers to Discharge: Continued Medical Work up  Expected Discharge Plan and Services       Living arrangements for the past 2 months: Single Family Home                                       Social Determinants of Health (SDOH) Interventions SDOH Screenings   Food Insecurity: No Food Insecurity (06/01/2023)  Housing: Low Risk  (06/01/2023)  Transportation Needs: No Transportation Needs (06/01/2023)  Utilities: Not At Risk (06/01/2023)  Social Connections: Unknown (06/01/2023)  Tobacco Use: Medium Risk (05/31/2023)    Readmission Risk Interventions     No data to display

## 2023-06-02 NOTE — Progress Notes (Addendum)
 PHARMACY - ANTICOAGULATION CONSULT NOTE  Pharmacy Consult for heparin Indication: atrial fibrillation  Allergies  Allergen Reactions   Clobetasol Propionate Rash    Patient Measurements: Height: 5\' 8"  (172.7 cm) Weight: 78.5 kg (173 lb 1 oz) IBW/kg (Calculated) : 68.4 Heparin dosing weight = total weight  Vital Signs: Temp: 97.5 F (36.4 C) (04/01 2050) Temp Source: Oral (04/01 2050) BP: 129/60 (04/01 2159) Pulse Rate: 75 (04/01 2159)  Labs: Recent Labs    05/31/23 1410 05/31/23 1622 06/01/23 0016 06/01/23 0409 06/01/23 1332 06/01/23 2337  HGB 11.5*  --   --  11.5*  --   --   HCT 36.0*  --   --  37.2*  --   --   PLT 138*  --   --  118*  --   --   HEPARINUNFRC  --   --   --   --  0.81* 0.64  CREATININE 1.32*  --   --  1.23  --   --   TROPONINIHS 100* 135* 116* 128*  --   --     Estimated Creatinine Clearance: 37.8 mL/min (by C-G formula based on SCr of 1.23 mg/dL).   Medical History: Past Medical History:  Diagnosis Date   Acute GI bleeding 10/30/2011   Anginal pain (HCC) 12/2010   Arthritis    "knees and left shoulder is completely shot"   BPH (benign prostatic hyperplasia)    CAD (coronary artery disease)    RCA occluded (Cath [place] 1980s);  LHC 12/22/10: dLM 30%, prox to mid LAD 30%, oOM 95%, AV branches supplied collats to PDA, RCA occluded, EF 65%.  He underwent placement of a Promus DES to the OM.   Carotid stenosis    0 - 39% bilateral   Diverticulosis    Exertional dyspnea 10/30/2011   GERD (gastroesophageal reflux disease)    Glaucoma    Gout    Hyperlipidemia    Hypertension    Pneumonia 1950's    Medications: No anticoagulants PTA  Assessment: Pt is a 55 yoM presenting with falls. PMH significant for GIB in 2013, CAD, HTN. Pharmacy consulted to dose heparin for afib. Cardiology following.   Today, 06/02/23 Heparin level = 0.64- therapeutic on heparin infusion of 850 units/hr CBC: Hgb low but stable, Plt low Confirmed with RN - no  signs of bleeding or infusion related concerns SCr slightly improved  Goal of Therapy:  Heparin level 0.3-0.7 units/ml Monitor platelets by anticoagulation protocol: Yes   Plan:  Continue heparin infusion at 850 units/hr CBC, heparin level daily Monitor for signs of bleeding  Junita Push, PharmD 06/02/2023,12:20 AM  Addendum: heparin level 0.62- remains therapeutic  CBC: hg 10.3/pltc 132 both low but relatively stable RN reports no bleeding or infusion issues overnight.   Plan: -Continue IV heparin 850 units/hr -F/U daily labs tomorrow.  Junita Push, PharmD, BCPS 06/02/2023@5 :25 AM

## 2023-06-02 NOTE — Progress Notes (Signed)
 Around 2030: pt was very aggressive, yelling and confused.  Around 2130: pt calmed down and went back to bed. Around 2340: pt had large black stool and black coffee ground emesis. Notify pharmacy and On call d/t heparin drip. Hold heparin drip per order.  BP was 139/74 @ 2045 and 96/61 @ 2350.  Pt is currently resting in bed and will continue to monitor.

## 2023-06-02 NOTE — Progress Notes (Signed)
 PROGRESS NOTE [name]  UJW:[id number] DOB: August 23, 1930 DOA: 05/31/2023 PCP: Gaspar Garbe, MD  Brief Narrative/Hospital Course: 89 year old man presented after a fall at home.  PMH CAD, PCI.  Evaluation revealed influenza A, atrial fibrillation rate controlled.  Patient admitted for flu and new A-fib.  Consultants Cardiology   Procedures/Events None  Subjective: Seen and examined this morning Resting comfortably Overnight afebrile BP stable on room air. Labs reviewed hypokalemia 3.1 creatinine trended up 1.4 B12 folate stable CBC with mild leukocytosis chronic anemia. TSH low at 0.2 A1c 5.8  Assessment and plan:  Acute bronchitis with influenza A infection: Respiratory status stable on Tamiflu prednisone continue same to complete the course and a steroid taper off  New onset A-fib  Elevated troponin-from demand ischemia: Rate controlled.  PTA on metoprolol 25 twice daily.  Echo reassuring.  Input appreciated from cardiology TSH depressed but normal FT4, continue metoprolol, aspirin 81.  Patient high risk for bleeding/trauma not a ideal candidate for anticoagulation for long-term.  She has been admitted on heparin drip, plan to continue x 48 hours. Cardiology managing.   Low TSH Normal free T4, monitor as outpatient  Hypokalemia: Replace potassium.   Hypertensive urgency  BP stable continue metoprolol lisinopril per cardiology    Fall at home [facility] SDH Continue PT OT> recommending SNF at discharge   Elevated creatinine on admission 1.32 Consider AKI or CKD, last values from 2014.  Level fluctuating from 1.2-1.4 suspect CKD stage IIIb.  Continue to monitor. She had been on ivf and creat trending up Recent Labs    05/31/23 1410 06/01/23 0409 06/02/23 0445  BUN 29* 26* 50*  CREATININE 1.32* 1.23 1.44*  CO2 25 22 24   K 3.7 3.5 3.1*    Macrocytic anemia  Thrombocytopenia Stable check B12 and folate Continue to monitor labs platelet improving    Hyperlipidemia: Continue statin   DVT prophylaxis:  Code Status:   Code Status: Full Code Family Communication: plan of care discussed with patient at bedside. Patient status is: Remains hospitalized because of severity of illness Level of care: Telemetry   Dispo: The patient is from: home            Anticipated disposition: SNF once ok w/ cardio  Objective: Vitals last 24 hrs: Vitals:   06/02/23 0325 06/02/23 0759 06/02/23 0830 06/02/23 1224  BP:   (!) 136/94 137/64  Pulse:    67  Resp:    16  Temp:    97.6 F (36.4 C)  TempSrc:      SpO2: 92% 96%  96%  Weight:      Height:       Weight change:   Physical Examination: General exam: alert awake, older than stated age HEENT:Oral mucosa moist, Ear/Nose WNL grossly Respiratory system: Bilaterally diminished BS, no use of accessory muscle Cardiovascular system: S1 & S2 +. Gastrointestinal system: Abdomen soft, NT,ND,BS+ Nervous System: Alert, awake,following commands. Extremities: LE edema neg, moving arms, warm legs Skin: No rashes,warm. MSK: Normal muscle bulk/tone.   Medications reviewed:  Scheduled Meds:  aspirin EC  81 mg Oral Daily   atorvastatin  40 mg Oral Daily   latanoprost  1 drop Left Eye QHS   lisinopril  40 mg Oral Daily   metoprolol tartrate  25 mg Oral BID   oseltamivir  30 mg Oral BID   predniSONE  40 mg Oral Q breakfast   timolol  1 drop Left Eye BID   Continuous Infusions:  heparin 850 Units/hr (06/02/23 0513)  lactated ringers 75 mL/hr at 06/02/23 5409     Diet Order             Diet Heart Room service appropriate? Yes; Fluid consistency: Thin  Diet effective now                  Intake/Output Summary (Last 24 hours) at 06/02/2023 1249 Last data filed at 06/02/2023 0825 Gross per 24 hour  Intake 931.19 ml  Output 900 ml  Net 31.19 ml   Net IO Since Admission: 1,202.06 mL [06/02/23 1249]  Wt Readings from Last 3 Encounters:  06/01/23 78.5 kg  04/28/18 84.2 kg  04/13/17 81.6 kg      Unresulted Labs (From admission, onward)     Start     Ordered   06/03/23 0500  CBC  Tomorrow morning,   R        06/02/23 0847   06/02/23 0500  T3  Tomorrow morning,   R        06/01/23 1751          Data Reviewed: I have personally reviewed following labs and imaging studies ( see epic result tab) CBC: Recent Labs  Lab 05/31/23 1410 06/01/23 0409 06/02/23 0445  WBC 7.3 8.8 13.8*  NEUTROABS 5.5  --   --   HGB 11.5* 11.5* 10.3*  HCT 36.0* 37.2* 31.5*  MCV 98.6 103.3* 96.6  PLT 138* 118* 132*   CMP: Recent Labs  Lab 05/31/23 1410 06/01/23 0409 06/02/23 0445  NA 137 134* 133*  K 3.7 3.5 3.1*  CL 102 102 100  CO2 25 22 24   GLUCOSE 127* 119* 141*  BUN 29* 26* 50*  CREATININE 1.32* 1.23 1.44*  CALCIUM 9.3 9.0 8.7*  MG 1.7  --   --    GFR: Estimated Creatinine Clearance: 32.3 mL/min (A) (by C-G formula based on SCr of 1.44 mg/dL (H)). Recent Labs  Lab 05/31/23 1410 06/01/23 0409  AST 43* 66*  ALT 16 19  ALKPHOS 62 55  BILITOT 0.8 1.1  PROT 6.2* 5.8*  ALBUMIN 3.5 3.2*   No results for input(s): "LIPASE", "AMYLASE" in the last 168 hours. No results for input(s): "AMMONIA" in the last 168 hours. Coagulation Profile: No results for input(s): "INR", "PROTIME" in the last 168 hours. No results for input(s): "PROBNP" in the last 168 hours.  Recent Labs    06/01/23 0409  HGBA1C 5.8*   No results for input(s): "GLUCAP" in the last 168 hours. Recent Labs    06/01/23 0409  CHOL 107  HDL 45  LDLCALC 52  TRIG 49  CHOLHDL 2.4   Recent Labs    06/01/23 0016 06/02/23 0445  TSH 0.297*  --   FREET4  --  1.01   Sepsis Labs: Recent Labs  Lab 05/31/23 1418  LATICACIDVEN 1.6   Recent Results (from the past 240 hours)  Blood Culture (routine x 2)     Status: None (Preliminary result)   Collection Time: 05/31/23  2:10 PM   Specimen: BLOOD LEFT ARM  Result Value Ref Range Status   Specimen Description   Final    BLOOD LEFT ARM Performed at [facility], [address], [place], [place] [address]    Special Requests   Final    BOTTLES DRAWN AEROBIC AND ANAEROBIC Blood Culture results may not be optimal due to an inadequate volume of blood received in culture bottles Performed at [facility], [address]  [place], [place] [address]    Culture   Final    NO GROWTH 2 DAYS Performed at [facility], [address] [place], [place] [address]    Report Status PENDING  Incomplete  Blood Culture (routine x 2)     Status: None (Preliminary result)   Collection Time: 05/31/23  2:12 PM   Specimen: BLOOD  Result Value Ref Range Status   Specimen Description   Final    BLOOD LEFT ANTECUBITAL Performed at [facility], [address] [place], [place] [address]    Special Requests   Final    BOTTLES DRAWN AEROBIC ONLY Blood Culture results may not be optimal due to an inadequate volume of blood received in culture bottles Performed at [facility], [address], [place], [place] [address]    Culture   Final    NO GROWTH 2 DAYS Performed at [facility], [address], [place], [place] [address]    Report Status PENDING  Incomplete  Resp panel by RT-PCR (RSV, Flu A&B, Covid) Anterior Nasal Swab     Status: Abnormal   Collection Time: 05/31/23  2:28 PM   Specimen: Anterior Nasal Swab  Result Value Ref Range Status   SARS Coronavirus 2 by RT PCR NEGATIVE NEGATIVE Final    Comment: (NOTE) SARS-CoV-2 target nucleic acids are NOT DETECTED.  The SARS-CoV-2 RNA is generally detectable in upper respiratory specimens during the acute phase of infection. The lowest concentration of SARS-CoV-2 viral copies this assay can detect is 138 copies/mL. A negative result does not preclude SARS-Cov-2 infection and should not be used as the sole basis for treatment or other patient management decisions. A negative result may occur with  improper specimen collection/handling,  submission of specimen other than nasopharyngeal swab, presence of viral mutation(s) within the areas targeted by this assay, and inadequate number of viral copies(<138 copies/mL). A negative result must be combined with clinical observations, patient history, and epidemiological information. The expected result is Negative.  Fact Sheet for Patients:  BloggerCourse.com  Fact Sheet for Healthcare Providers:  SeriousBroker.it  This test is no t yet approved or cleared by the [place] FDA and  has been authorized for detection and/or diagnosis of SARS-CoV-2 by FDA under an Emergency Use Authorization (EUA). This EUA will remain  in effect (meaning this test can be used) for the duration of the COVID-19 declaration under Section 564(b)(1) of the Act, 21 U.S.C.section 360bbb-3(b)(1), unless the authorization is terminated  or revoked sooner.       Influenza A by PCR POSITIVE (A) NEGATIVE Final   Influenza B by PCR NEGATIVE NEGATIVE Final    Comment: (NOTE) The Xpert Xpress SARS-CoV-2/FLU/RSV plus assay is intended as an aid in the diagnosis of influenza from Nasopharyngeal swab specimens and should not be used as a sole basis for treatment. Nasal washings and aspirates are unacceptable for Xpert Xpress SARS-CoV-2/FLU/RSV testing.  Fact Sheet for Patients: BloggerCourse.com  Fact Sheet for Healthcare Providers: SeriousBroker.it  This test is not yet approved or cleared by the [place] FDA and has been authorized for detection and/or diagnosis of SARS-CoV-2 by FDA under an Emergency Use Authorization (EUA). This EUA will remain in effect (meaning this test can be used) for the duration of the COVID-19 declaration under Section 564(b)(1) of the Act, 21 U.S.C. section 360bbb-3(b)(1), unless the authorization is terminated or revoked.     Resp Syncytial Virus by PCR  NEGATIVE NEGATIVE Final  Comment: (NOTE) Fact Sheet for Patients: BloggerCourse.com  Fact Sheet for Healthcare Providers: SeriousBroker.it  This test is not yet approved or cleared by the [place] FDA and has been authorized for detection and/or diagnosis of SARS-CoV-2 by FDA under an Emergency Use Authorization (EUA). This EUA will remain in effect (meaning this test can be used) for the duration of the COVID-19 declaration under Section 564(b)(1) of the Act, 21 U.S.C. section 360bbb-3(b)(1), unless the authorization is terminated or revoked.  Performed at [facility], [address] [place], [place] [address]    Antimicrobials/Microbiology: Anti-infectives (From admission, onward)    Start     Dose/Rate Route Frequency Ordered Stop   06/01/23 2200  oseltamivir (TAMIFLU) capsule 30 mg        30 mg Oral 2 times daily 06/01/23 0502 06/06/23 0959   06/01/23 0515  oseltamivir (TAMIFLU) capsule 75 mg        75 mg Oral  Once 06/01/23 0500 06/01/23 0603         Component Value Date/Time   SDES  05/31/2023 1412    BLOOD LEFT ANTECUBITAL Performed at [facility], [address] [place], [place] [address]    SPECREQUEST  05/31/2023 1412    BOTTLES DRAWN AEROBIC ONLY Blood Culture results may not be optimal due to an inadequate volume of blood received in culture bottles Performed at [facility], [address], [place], [place] [address]    CULT  05/31/2023 1412    NO GROWTH 2 DAYS Performed at [facility], [address], [place], [place] [address]    REPTSTATUS PENDING 05/31/2023 1412     Radiology Studies: ECHOCARDIOGRAM COMPLETE Result Date: 06/01/2023    ECHOCARDIOGRAM REPORT   Patient Name:   [name] Date of Exam: 06/01/2023 Medical Rec #:  [id number]   Height:       68.0 in Accession #:    [id number]  Weight:       173.1 lb Date of Birth:   01-20-1931    BSA:          1.922 m Patient Age:    89 years    BP:           164/67 mmHg Patient Gender: M           HR:           90 bpm. Exam Location:  Inpatient Procedure: 2D Echo, Cardiac Doppler and Color Doppler (Both Spectral and Color            Flow Doppler were utilized during procedure). Indications:    Atrial fibrillation  History:        Patient has no prior history of Echocardiogram examinations.                 CAD, Arrythmias:Atrial Fibrillation; Risk Factors:Dyslipidemia                 and Hypertension. Carotid stenosis.  Sonographer:    Vern Claude Referring Phys: [id number] Eduard Clos  Sonographer Comments: Image acquisition challenging due to respiratory motion. IMPRESSIONS  1. There is increased flow velocity across the LV outflow tract, due to hyperdynamic left ventricular contractility. There does not appear to be true dynamic LV outflow obstruction. Left ventricular ejection fraction, by estimation, is 70 to 75%. The left ventricle has hyperdynamic function. The left ventricle has no regional wall motion abnormalities. Left ventricular diastolic function could not be evaluated.  2.  Right ventricular systolic function is normal. The right ventricular size is normal. Tricuspid regurgitation signal is inadequate for assessing PA pressure.  3. Left atrial size was mild to moderately dilated.  4. The mitral valve is normal in structure. No evidence of mitral valve regurgitation.  5. The aortic valve is normal in structure. There is mild calcification of the aortic valve. Aortic valve regurgitation is mild. Aortic valve sclerosis is present, with no evidence of aortic valve stenosis. FINDINGS  Left Ventricle: There is increased flow velocity across the LV outflow tract, due to hyperdynamic left ventricular contractility. There does not appear to be true dynamic LV outflow obstruction. Left ventricular ejection fraction, by estimation, is 70 to 75%. The left ventricle has hyperdynamic  function. The left ventricle has no regional wall motion abnormalities. The left ventricular internal cavity size was normal in size. There is borderline concentric left ventricular hypertrophy. Left ventricular diastolic function could not be evaluated due to atrial fibrillation. Left ventricular diastolic function could not be evaluated. Right Ventricle: The right ventricular size is normal. No increase in right ventricular wall thickness. Right ventricular systolic function is normal. Tricuspid regurgitation signal is inadequate for assessing PA pressure. Left Atrium: Left atrial size was mild to moderately dilated. Right Atrium: Right atrial size was normal in size. Pericardium: There is no evidence of pericardial effusion. Mitral Valve: The mitral valve is normal in structure. No evidence of mitral valve regurgitation. MV peak gradient, 6.8 mmHg. The mean mitral valve gradient is 3.0 mmHg. Tricuspid Valve: The tricuspid valve is normal in structure. Tricuspid valve regurgitation is not demonstrated. Aortic Valve: The aortic valve is normal in structure. There is mild calcification of the aortic valve. Aortic valve regurgitation is mild. Aortic valve sclerosis is present, with no evidence of aortic valve stenosis. Aortic valve mean gradient measures 5.3 mmHg. Aortic valve peak gradient measures 10.8 mmHg. Aortic valve area, by VTI measures 3.00 cm. Pulmonic Valve: The pulmonic valve was not well visualized. Pulmonic valve regurgitation is not visualized. Aorta: The aortic root was not well visualized. Venous: The inferior vena cava was not well visualized. IAS/Shunts: No atrial level shunt detected by color flow Doppler.  LEFT VENTRICLE PLAX 2D LVIDd:         3.50 cm      Diastology LVIDs:         2.30 cm      LV e' medial:    10.00 cm/s LV PW:         1.20 cm      LV E/e' medial:  10.8 LV IVS:        1.10 cm      LV e' lateral:   12.80 cm/s LVOT diam:     1.92 cm      LV E/e' lateral: 8.4 LV SV:         71 LV  SV Index:   37 LVOT Area:     2.90 cm  LV Volumes (MOD) LV vol d, MOD A2C: 168.0 ml LV vol d, MOD A4C: 168.0 ml LV vol s, MOD A2C: 53.2 ml LV vol s, MOD A4C: 65.0 ml LV SV MOD A2C:     114.8 ml LV SV MOD A4C:     168.0 ml LV SV MOD BP:      108.2 ml RIGHT VENTRICLE RV Basal diam:  3.00 cm RV Mid diam:    2.50 cm RV S prime:     16.60 cm/s TAPSE (M-mode): 2.0 cm LEFT ATRIUM  Index        RIGHT ATRIUM           Index LA diam:        3.60 cm 1.87 cm/m   RA Area:     10.60 cm LA Vol (A2C):   61.2 ml 31.84 ml/m  RA Volume:   18.40 ml  9.57 ml/m LA Vol (A4C):   99.9 ml 51.98 ml/m LA Biplane Vol: 85.3 ml 44.38 ml/m  AORTIC VALVE                     PULMONIC VALVE AV Area (Vmax):    3.39 cm      PV Vmax:       1.42 m/s AV Area (Vmean):   2.97 cm      PV Peak grad:  8.1 mmHg AV Area (VTI):     3.00 cm AV Vmax:           164.00 cm/s AV Vmean:          101.033 cm/s AV VTI:            0.235 m AV Peak Grad:      10.8 mmHg AV Mean Grad:      5.3 mmHg LVOT Vmax:         192.00 cm/s LVOT Vmean:        103.500 cm/s LVOT VTI:          0.244 m LVOT/AV VTI ratio: 1.04  AORTA Ao Root diam: 2.70 cm Ao Asc diam:  2.60 cm MITRAL VALVE MV Area (PHT): 5.54 cm     SHUNTS MV Peak grad:  6.8 mmHg     Systemic VTI:  0.24 m MV Mean grad:  3.0 mmHg     Systemic Diam: 1.92 cm MV Vmax:       1.30 m/s MV Vmean:      79.5 cm/s MV Decel Time: 137 msec MV E velocity: 108.00 cm/s Rachelle Hora Croitoru MD Electronically signed by Thurmon Fair MD Signature Date/Time: 06/01/2023/9:08:30 AM    Final    CT CERVICAL SPINE WO CONTRAST Result Date: 05/31/2023 CLINICAL DATA:  Polytrauma, blunt.  Multiple falls. EXAM: CT CERVICAL SPINE WITHOUT CONTRAST TECHNIQUE: Multidetector CT imaging of the cervical spine was performed without intravenous contrast. Multiplanar CT image reconstructions were also generated. RADIATION DOSE REDUCTION: This exam was performed according to the departmental dose-optimization program which includes automated exposure  control, adjustment of the mA and/or kV according to patient size and/or use of iterative reconstruction technique. COMPARISON:  None Available. FINDINGS: Alignment: Normal Skull base and vertebrae: No acute fracture. No primary bone lesion or focal pathologic process. Soft tissues and spinal canal: No prevertebral fluid or swelling. No visible canal hematoma. Disc levels: Diffuse advanced degenerative disc disease with fusion across the C5-6 and C6-7 disc spaces. Advanced bilateral degenerative facet disease with fusion diffusely. Upper chest: No acute findings Other: None IMPRESSION: Diffuse advanced degenerative disc disease and facet disease. No acute bony abnormality. Electronically Signed   By: Charlett Nose M.D.   On: 05/31/2023 17:23   CT HEAD WO CONTRAST Result Date: 05/31/2023 CLINICAL DATA:  Head trauma, moderate-severe.  Multiple falls. EXAM: CT HEAD WITHOUT CONTRAST TECHNIQUE: Contiguous axial images were obtained from the base of the skull through the vertex without intravenous contrast. RADIATION DOSE REDUCTION: This exam was performed according to the departmental dose-optimization program which includes automated exposure control, adjustment of the mA and/or kV according to patient size and/or  use of iterative reconstruction technique. COMPARISON:  None Available. FINDINGS: Brain: There is atrophy and chronic small vessel disease changes. No acute intracranial abnormality. Specifically, no hemorrhage, hydrocephalus, mass lesion, acute infarction, or significant intracranial injury. Vascular: No hyperdense vessel or unexpected calcification. Skull: No acute calvarial abnormality. Sinuses/Orbits: No acute findings Other: None IMPRESSION: Atrophy, chronic microvascular disease. No acute intracranial abnormality. Electronically Signed   By: Charlett Nose M.D.   On: 05/31/2023 17:21   DG Pelvis Portable Result Date: 05/31/2023 CLINICAL DATA:  Multiple mechanical falls EXAM: PORTABLE PELVIS 1-2 VIEWS  COMPARISON:  None Available. FINDINGS: Supine frontal view of the pelvis includes both hips. No acute displaced fracture, subluxation, or dislocation. Mild symmetrical bilateral hip osteoarthritis. Severe lower lumbar degenerative changes greatest at L4-5 and L5-S1. Soft tissues are unremarkable. IMPRESSION: 1. No acute pelvic fracture. 2. Degenerative changes of the lower lumbar spine and bilateral hips. Electronically Signed   By: Sharlet Salina M.D.   On: 05/31/2023 15:40   DG Chest Port 1 View Result Date: 05/31/2023 CLINICAL DATA:  Questionable sepsis EXAM: PORTABLE CHEST 1 VIEW COMPARISON:  Right shoulder x-ray 06/20/2011. FINDINGS: Single frontal view of the chest demonstrates an unremarkable cardiac silhouette. Chronic elevation of the right hemidiaphragm. No acute airspace disease, effusion, or pneumothorax. No acute bony abnormalities. IMPRESSION: 1. No acute intrathoracic process. Electronically Signed   By: Sharlet Salina M.D.   On: 05/31/2023 15:39     LOS: 0 days   Total time spent in review of labs and imaging, patient evaluation, formulation of plan, documentation and communication with patient/family: 35 minutes  Lanae Boast, MD [facility] 06/02/2023, 12:49 PM

## 2023-06-02 NOTE — Progress Notes (Signed)
 Patient Name: [name] Date of Encounter: 06/02/2023 [facility] Cardiologist: Rollene Rotunda, MD   Interval Summary  .    Feeling off today but clinically appears better .  Vital Signs .    Vitals:   06/02/23 0325 06/02/23 0759 06/02/23 0830 06/02/23 1224  BP:   (!) 136/94 137/64  Pulse:    67  Resp:    16  Temp:    97.6 F (36.4 C)  TempSrc:      SpO2: 92% 96%  96%  Weight:      Height:        Intake/Output Summary (Last 24 hours) at 06/02/2023 1511 Last data filed at 06/02/2023 1306 Gross per 24 hour  Intake 1017 ml  Output 900 ml  Net 117 ml      06/01/2023    5:52 AM 04/28/2018    3:15 PM 04/13/2017    9:28 AM  Last 3 Weights  Weight (lbs) 173 lb 1 oz 185 lb 9.6 oz 180 lb  Weight (kg) 78.5 kg 84.188 kg 81.647 kg      Telemetry/ECG    Afib with reasonable CVR - Personally Reviewed  Physical Exam .   GEN: No acute distress.   Neck: No JVD Cardiac: RRR, no murmurs, rubs, or gallops.  Respiratory: Clear to auscultation bilaterally. GI: Soft, nontender, non-distended  MS: No edema  Assessment & Plan .     A fib with controlled ventricular rate, new diagnosis - no known hx in the past, unknown chronicity,  in the setting of Flu A infection,  asymptomatic, ventricular rate controlled, on PTA metoprolol 25mg  BID, will continue for rate control  - CHA2DS2-VASc Score = 3 , ideally recommend anticoagulation for CVA prophylaxis, however, given his history of frequent falls, SDH noted on CTH in 2014, memory deficit, advanced age, HAS BLED score is 5,  he is at high risk of bleeding/trauma, favor no anticoagulation at this time, discussed this matter with patient and his son per his request. -I spoke to the patient's son today. We decided to continue IV heparin while he in the hospital but will discontinue upon discharge and will not start oral OAC due to concern of falls even while working with rehab at a SNF. With no anticoagulation, he would not be a  candidate for outpatient DCCV. With respiratory issues in hospital, would not be a good candidate for TEE/DCCV in hospital. - echo shows hyperdynamic function, would encourage oral intake and will uptitrate metoprolol to 37.5 mg BID. - TSH low, need to rule out hyperthyroidism, defer further workup to primary team  - Keep Mag >2 and K >4 please    CAD with CTO of RCA and OM DES 2012 Elevated troponin  - Hs trop 100 >135 >116 > 128 - EKG with non-specific lateral T wave abnormalities  - Echo wnl - suspect demand ischemic from HTN urgency, Flu A infection - given his lacking of angina symptoms, advanced age, poor memory and low baseline functional status, favor conservative medical management for CAD  - continue PTA ASA 81mg , simvastatin 80mg , metoprolol 37.5 mg BID, and lisinopril 40mg  daily  - will check lipid panel and A1C for optimizing medical therapy    HTN urgency  - he states he was compliant with his medications - continue PTA metoprolol lisinopril 40mg , would stop PTA hydrochlorothiazide 25mg , start amlodipine 5mg  daily if BP needs . Uptitrate metoprolol to 37.5 mg Bid for benefit of hyperdynamic LV function.   Flu  A Falls AKI versus CKD  Anemia  HLD - per primary team   No further inpatient recommendations from cardiology. F/u has been arranged 4/22. Will sign off, please reengage if needed prior to hospital dc.   For questions or updates, please contact [facility] Please consult [email or web address] for contact info under        Signed, Parke Poisson, MD

## 2023-06-02 NOTE — Progress Notes (Signed)
 Received report from departing RN.  Agree with assessments.

## 2023-06-03 DIAGNOSIS — T4145XA Adverse effect of unspecified anesthetic, initial encounter: Secondary | ICD-10-CM | POA: Diagnosis present

## 2023-06-03 DIAGNOSIS — R296 Repeated falls: Secondary | ICD-10-CM | POA: Diagnosis present

## 2023-06-03 DIAGNOSIS — K921 Melena: Secondary | ICD-10-CM

## 2023-06-03 DIAGNOSIS — Y92009 Unspecified place in unspecified non-institutional (private) residence as the place of occurrence of the external cause: Secondary | ICD-10-CM | POA: Diagnosis not present

## 2023-06-03 DIAGNOSIS — I2489 Other forms of acute ischemic heart disease: Secondary | ICD-10-CM | POA: Diagnosis present

## 2023-06-03 DIAGNOSIS — K920 Hematemesis: Secondary | ICD-10-CM

## 2023-06-03 DIAGNOSIS — E785 Hyperlipidemia, unspecified: Secondary | ICD-10-CM | POA: Diagnosis present

## 2023-06-03 DIAGNOSIS — I10 Essential (primary) hypertension: Secondary | ICD-10-CM | POA: Diagnosis not present

## 2023-06-03 DIAGNOSIS — N1831 Chronic kidney disease, stage 3a: Secondary | ICD-10-CM | POA: Diagnosis present

## 2023-06-03 DIAGNOSIS — Z791 Long term (current) use of non-steroidal anti-inflammatories (NSAID): Secondary | ICD-10-CM

## 2023-06-03 DIAGNOSIS — D62 Acute posthemorrhagic anemia: Secondary | ICD-10-CM

## 2023-06-03 DIAGNOSIS — I4891 Unspecified atrial fibrillation: Secondary | ICD-10-CM | POA: Diagnosis present

## 2023-06-03 DIAGNOSIS — E876 Hypokalemia: Secondary | ICD-10-CM | POA: Diagnosis not present

## 2023-06-03 DIAGNOSIS — R7401 Elevation of levels of liver transaminase levels: Secondary | ICD-10-CM

## 2023-06-03 DIAGNOSIS — I129 Hypertensive chronic kidney disease with stage 1 through stage 4 chronic kidney disease, or unspecified chronic kidney disease: Secondary | ICD-10-CM | POA: Diagnosis present

## 2023-06-03 DIAGNOSIS — J101 Influenza due to other identified influenza virus with other respiratory manifestations: Secondary | ICD-10-CM | POA: Diagnosis present

## 2023-06-03 DIAGNOSIS — Z955 Presence of coronary angioplasty implant and graft: Secondary | ICD-10-CM | POA: Diagnosis not present

## 2023-06-03 DIAGNOSIS — N400 Enlarged prostate without lower urinary tract symptoms: Secondary | ICD-10-CM | POA: Diagnosis present

## 2023-06-03 DIAGNOSIS — D696 Thrombocytopenia, unspecified: Secondary | ICD-10-CM | POA: Diagnosis present

## 2023-06-03 DIAGNOSIS — K264 Chronic or unspecified duodenal ulcer with hemorrhage: Secondary | ICD-10-CM | POA: Diagnosis not present

## 2023-06-03 DIAGNOSIS — K269 Duodenal ulcer, unspecified as acute or chronic, without hemorrhage or perforation: Secondary | ICD-10-CM | POA: Diagnosis not present

## 2023-06-03 DIAGNOSIS — K219 Gastro-esophageal reflux disease without esophagitis: Secondary | ICD-10-CM | POA: Diagnosis present

## 2023-06-03 DIAGNOSIS — E871 Hypo-osmolality and hyponatremia: Secondary | ICD-10-CM

## 2023-06-03 DIAGNOSIS — W010XXA Fall on same level from slipping, tripping and stumbling without subsequent striking against object, initial encounter: Secondary | ICD-10-CM | POA: Diagnosis present

## 2023-06-03 DIAGNOSIS — K222 Esophageal obstruction: Secondary | ICD-10-CM | POA: Diagnosis not present

## 2023-06-03 DIAGNOSIS — I160 Hypertensive urgency: Secondary | ICD-10-CM | POA: Diagnosis present

## 2023-06-03 DIAGNOSIS — G928 Other toxic encephalopathy: Secondary | ICD-10-CM | POA: Diagnosis not present

## 2023-06-03 DIAGNOSIS — Z79899 Other long term (current) drug therapy: Secondary | ICD-10-CM | POA: Diagnosis not present

## 2023-06-03 DIAGNOSIS — J189 Pneumonia, unspecified organism: Secondary | ICD-10-CM | POA: Diagnosis not present

## 2023-06-03 DIAGNOSIS — Z87891 Personal history of nicotine dependence: Secondary | ICD-10-CM | POA: Diagnosis not present

## 2023-06-03 DIAGNOSIS — I2510 Atherosclerotic heart disease of native coronary artery without angina pectoris: Secondary | ICD-10-CM | POA: Diagnosis present

## 2023-06-03 DIAGNOSIS — Z1152 Encounter for screening for COVID-19: Secondary | ICD-10-CM | POA: Diagnosis not present

## 2023-06-03 DIAGNOSIS — M7989 Other specified soft tissue disorders: Secondary | ICD-10-CM | POA: Diagnosis not present

## 2023-06-03 DIAGNOSIS — N179 Acute kidney failure, unspecified: Secondary | ICD-10-CM

## 2023-06-03 DIAGNOSIS — K3189 Other diseases of stomach and duodenum: Secondary | ICD-10-CM | POA: Diagnosis not present

## 2023-06-03 DIAGNOSIS — I4819 Other persistent atrial fibrillation: Secondary | ICD-10-CM | POA: Diagnosis present

## 2023-06-03 DIAGNOSIS — D539 Nutritional anemia, unspecified: Secondary | ICD-10-CM | POA: Diagnosis present

## 2023-06-03 DIAGNOSIS — I480 Paroxysmal atrial fibrillation: Secondary | ICD-10-CM | POA: Diagnosis not present

## 2023-06-03 LAB — CBC
HCT: 31 % — ABNORMAL LOW (ref 39.0–52.0)
Hemoglobin: 9.8 g/dL — ABNORMAL LOW (ref 13.0–17.0)
MCH: 31.9 pg (ref 26.0–34.0)
MCHC: 31.6 g/dL (ref 30.0–36.0)
MCV: 101 fL — ABNORMAL HIGH (ref 80.0–100.0)
Platelets: 171 10*3/uL (ref 150–400)
RBC: 3.07 MIL/uL — ABNORMAL LOW (ref 4.22–5.81)
RDW: 14.9 % (ref 11.5–15.5)
WBC: 18.3 10*3/uL — ABNORMAL HIGH (ref 4.0–10.5)
nRBC: 0 % (ref 0.0–0.2)

## 2023-06-03 LAB — BASIC METABOLIC PANEL WITH GFR
Anion gap: 8 (ref 5–15)
BUN: 88 mg/dL — ABNORMAL HIGH (ref 8–23)
CO2: 21 mmol/L — ABNORMAL LOW (ref 22–32)
Calcium: 8.6 mg/dL — ABNORMAL LOW (ref 8.9–10.3)
Chloride: 101 mmol/L (ref 98–111)
Creatinine, Ser: 1.41 mg/dL — ABNORMAL HIGH (ref 0.61–1.24)
GFR, Estimated: 47 mL/min — ABNORMAL LOW (ref >60)
Glucose, Bld: 135 mg/dL — ABNORMAL HIGH (ref 70–99)
Potassium: 4.2 mmol/L (ref 3.5–5.1)
Sodium: 130 mmol/L — ABNORMAL LOW (ref 135–145)

## 2023-06-03 LAB — T3: T3, Total: 73 ng/dL (ref 71–180)

## 2023-06-03 LAB — OCCULT BLOOD X 1 CARD TO LAB, STOOL: Fecal Occult Bld: POSITIVE — AB

## 2023-06-03 MED ORDER — TAMSULOSIN HCL 0.4 MG PO CAPS
0.4000 mg | ORAL_CAPSULE | Freq: Every day | ORAL | Status: DC
  Administered 2023-06-03 – 2023-06-04 (×2): 0.4 mg via ORAL
  Filled 2023-06-03 (×2): qty 1

## 2023-06-03 MED ORDER — FAMOTIDINE 20 MG PO TABS
20.0000 mg | ORAL_TABLET | Freq: Every day | ORAL | Status: DC
  Administered 2023-06-03 – 2023-06-07 (×5): 20 mg via ORAL
  Filled 2023-06-03 (×6): qty 1

## 2023-06-03 MED ORDER — FLUTICASONE PROPIONATE 50 MCG/ACT NA SUSP: 1.0000 | Freq: Two times a day (BID) | NASAL | Status: DC | PRN

## 2023-06-03 MED ORDER — PREDNISONE 20 MG PO TABS
20.0000 mg | ORAL_TABLET | Freq: Every day | ORAL | Status: DC
  Administered 2023-06-04 – 2023-06-06 (×3): 20 mg via ORAL
  Filled 2023-06-03 (×3): qty 1

## 2023-06-03 NOTE — Progress Notes (Signed)
 PROGRESS NOTE [name]  ZOX:[id number] DOB: 23-Oct-1930 DOA: 05/31/2023 PCP: Gaspar Garbe, MD  Brief Narrative/Hospital Course: 89 year old man presented after a fall at home.  PMH CAD, PCI.  Evaluation revealed influenza A, atrial fibrillation rate controlled.  Patient admitted for flu and new A-fib.  Consultants Cardiology  Gastroenterology  Procedures/Events None  Subjective: Patient seen and examined this morning Resting comfortably no chest pain or shortness of breath Overnight patient had coffee-ground emesis dark stool some drop in hemoglobin GI was consulted  Vitals are stable  Labs shows creatinine stable 1.4 hemoglobin 9.8 WBC of 18.3 TSH low at 0.2 A1c 5.8  Assessment and plan:  Acute bronchitis with influenza A infection: Respiratory status stable.Cont on Tamiflu,  prednisone taper  New onset A-fib  Elevated troponin-from demand ischemia: Rate controlled.PTA on metoprolol 25 twice daily.  Echo reassuring.  Cardiology following input appreciated  TSH depressed but normal FT4 Plan is to cont metoprolol, aspirin 81. Patient high risk for bleeding/trauma not a ideal candidate for anticoagulation for long-term-heparin held overnight due to concern for GI bleeding  Coffee-ground emesis ABLA Leukocytosis: Overnight had coffee-ground emesis concern for GI bleeding heparin discontinued.  Trend hemoglobin.  GI has been consulted, continue Protonix q12hr Recent Labs  Lab 05/31/23 1410 06/01/23 0409 06/02/23 0445 06/03/23 0104  HGB 11.5* 11.5* 10.3* 9.8*  HCT 36.0* 37.2* 31.5* 31.0*    Macrocytic anemia  Thrombocytopenia Stable check B12 and folate Continue to monitor labs platelet improving  Low TSH Normal free T4, monitor as outpatient  Hypokalemia: Resolved  Hypertensive urgency  BP stable currently on metoprolol, lisinopril    Fall at home [facility] SDH Continue PT OT and planning for SNF at discharge   Elevated creatinine on admission 1.32: last  values from 2014.  Level fluctuating from 1.2-1.4 suspect CKD stage IIIb.  Continue to monitor-remains stable.  Monitor. She had been on ivf and creat trending up Recent Labs    05/31/23 1410 06/01/23 0409 06/02/23 0445 06/03/23 0104  BUN 29* 26* 50* 88*  CREATININE 1.32* 1.23 1.44* 1.41*  CO2 25 22 24  21*  K 3.7 3.5 3.1* 4.2    Hyperlipidemia: Continue statin   DVT prophylaxis:  Code Status:   Code Status: Full Code Family Communication: plan of care discussed with patient at bedside. Patient status is: Remains hospitalized because of severity of illness Level of care: Telemetry   Dispo: The patient is from: home            Anticipated disposition: SNF once ok w/ cardio 1-2 days  Objective: Vitals last 24 hrs: Vitals:   06/02/23 2349 06/03/23 0106 06/03/23 0444 06/03/23 0812  BP: 96/61 (!) 100/56 115/64 117/60  Pulse: 62 (!) 59 (!) 59 (!) 57  Resp: (!) 22 19 20    Temp:  (!) 97.5 F (36.4 C) 97.7 F (36.5 C)   TempSrc:  Oral Oral   SpO2: 96% 97% 95%   Weight:      Height:       Weight change:   Physical Examination: General exam: alert awake,  HEENT:Oral mucosa moist, Ear/Nose WNL grossly Respiratory system: Bilaterally clear BS,no use of accessory muscle Cardiovascular system: S1 & S2 +, No JVD. Gastrointestinal system: Abdomen soft,NT,ND, BS+ Nervous System: Alert, awake, moving all extremities,and following commands. Extremities: LE edema neg,distal peripheral pulses palpable and warm.  Skin: No rashes,no icterus. MSK: Normal muscle bulk,tone, power   Medications reviewed:  Scheduled Meds:  aspirin EC  81 mg Oral Daily  atorvastatin  40 mg Oral Daily   latanoprost  1 drop Left Eye QHS   lisinopril  40 mg Oral Daily   metoprolol tartrate  37.5 mg Oral BID   oseltamivir  30 mg Oral BID   pantoprazole (PROTONIX) IV  40 mg Intravenous Q12H   [START ON 06/04/2023] predniSONE  20 mg Oral Q breakfast   timolol  1 drop Left Eye BID  Continuous Infusions:    Diet Order             Diet NPO time specified Except for: Sips with Meds  Diet effective now                  Intake/Output Summary (Last 24 hours) at 06/03/2023 1118 Last data filed at 06/03/2023 0300 Gross per 24 hour  Intake 646.41 ml  Output 900 ml  Net -253.59 ml   Net IO Since Admission: 948.47 mL [06/03/23 1118]  Wt Readings from Last 3 Encounters:  06/01/23 78.5 kg  04/28/18 84.2 kg  04/13/17 81.6 kg     Unresulted Labs (From admission, onward)     Start     Ordered   06/03/23 0500  CBC  Daily,   R      06/02/23 1405          Data Reviewed: I have personally reviewed following labs and imaging studies ( see epic result tab) CBC: Recent Labs  Lab 05/31/23 1410 06/01/23 0409 06/02/23 0445 06/03/23 0104  WBC 7.3 8.8 13.8* 18.3*  NEUTROABS 5.5  --   --   --   HGB 11.5* 11.5* 10.3* 9.8*  HCT 36.0* 37.2* 31.5* 31.0*  MCV 98.6 103.3* 96.6 101.0*  PLT 138* 118* 132* 171   CMP: Recent Labs  Lab 05/31/23 1410 06/01/23 0409 06/02/23 0445 06/03/23 0104  NA 137 134* 133* 130*  K 3.7 3.5 3.1* 4.2  CL 102 102 100 101  CO2 25 22 24  21*  GLUCOSE 127* 119* 141* 135*  BUN 29* 26* 50* 88*  CREATININE 1.32* 1.23 1.44* 1.41*  CALCIUM 9.3 9.0 8.7* 8.6*  MG 1.7  --   --   --    GFR: Estimated Creatinine Clearance: 33 mL/min (A) (by C-G formula based on SCr of 1.41 mg/dL (H)). Recent Labs  Lab 05/31/23 1410 06/01/23 0409  AST 43* 66*  ALT 16 19  ALKPHOS 62 55  BILITOT 0.8 1.1  PROT 6.2* 5.8*  ALBUMIN 3.5 3.2*   No results for input(s): "LIPASE", "AMYLASE" in the last 168 hours. No results for input(s): "AMMONIA" in the last 168 hours. Coagulation Profile: No results for input(s): "INR", "PROTIME" in the last 168 hours. No results for input(s): "PROBNP" in the last 168 hours.  Recent Labs    06/01/23 0409  HGBA1C 5.8*   No results for input(s): "GLUCAP" in the last 168 hours. Recent Labs    06/01/23 0409  CHOL 107  HDL 45  LDLCALC 52  TRIG 49   CHOLHDL 2.4   Recent Labs    06/01/23 0016 06/02/23 0445  TSH 0.297*  --   FREET4  --  1.01   Sepsis Labs: Recent Labs  Lab 05/31/23 1418  LATICACIDVEN 1.6   Recent Results (from the past 240 hours)  Blood Culture (routine x 2)     Status: None (Preliminary result)   Collection Time: 05/31/23  2:10 PM   Specimen: BLOOD LEFT ARM  Result Value Ref Range Status   Specimen Description  Final    BLOOD LEFT ARM Performed at [facility], [address] [place], [place] [address]    Special Requests   Final    BOTTLES DRAWN AEROBIC AND ANAEROBIC Blood Culture results may not be optimal due to an inadequate volume of blood received in culture bottles Performed at [facility], [address] [place], [place] [address]    Culture   Final    NO GROWTH 3 DAYS Performed at [facility], [address], [place], [place] [address]    Report Status PENDING  Incomplete  Blood Culture (routine x 2)     Status: None (Preliminary result)   Collection Time: 05/31/23  2:12 PM   Specimen: BLOOD  Result Value Ref Range Status   Specimen Description   Final    BLOOD LEFT ANTECUBITAL Performed at [facility], [address], [place], [place] [address]    Special Requests   Final    BOTTLES DRAWN AEROBIC ONLY Blood Culture results may not be optimal due to an inadequate volume of blood received in culture bottles Performed at [facility], [address] [place], [place] [address]    Culture   Final    NO GROWTH 3 DAYS Performed at [facility], [address] [place], [place] [address]    Report Status PENDING  Incomplete  Resp panel by RT-PCR (RSV, Flu A&B, Covid) Anterior Nasal Swab     Status: Abnormal   Collection Time: 05/31/23  2:28 PM   Specimen: Anterior Nasal Swab  Result Value Ref Range Status   SARS Coronavirus 2 by RT PCR NEGATIVE NEGATIVE Final    Comment: (NOTE) SARS-CoV-2 target nucleic  acids are NOT DETECTED.  The SARS-CoV-2 RNA is generally detectable in upper respiratory specimens during the acute phase of infection. The lowest concentration of SARS-CoV-2 viral copies this assay can detect is 138 copies/mL. A negative result does not preclude SARS-Cov-2 infection and should not be used as the sole basis for treatment or other patient management decisions. A negative result may occur with  improper specimen collection/handling, submission of specimen other than nasopharyngeal swab, presence of viral mutation(s) within the areas targeted by this assay, and inadequate number of viral copies(<138 copies/mL). A negative result must be combined with clinical observations, patient history, and epidemiological information. The expected result is Negative.  Fact Sheet for Patients:  BloggerCourse.com  Fact Sheet for Healthcare Providers:  SeriousBroker.it  This test is no t yet approved or cleared by the [place] FDA and  has been authorized for detection and/or diagnosis of SARS-CoV-2 by FDA under an Emergency Use Authorization (EUA). This EUA will remain  in effect (meaning this test can be used) for the duration of the COVID-19 declaration under Section 564(b)(1) of the Act, 21 U.S.C.section 360bbb-3(b)(1), unless the authorization is terminated  or revoked sooner.       Influenza A by PCR POSITIVE (A) NEGATIVE Final   Influenza B by PCR NEGATIVE NEGATIVE Final    Comment: (NOTE) The Xpert Xpress SARS-CoV-2/FLU/RSV plus assay is intended as an aid in the diagnosis of influenza from Nasopharyngeal swab specimens and should not be used as a sole basis for treatment. Nasal washings and aspirates are unacceptable for Xpert Xpress SARS-CoV-2/FLU/RSV testing.  Fact Sheet for Patients: BloggerCourse.com  Fact Sheet for Healthcare  Providers: SeriousBroker.it  This test is not yet approved or cleared by the [place] FDA and has been  authorized for detection and/or diagnosis of SARS-CoV-2 by FDA under an Emergency Use Authorization (EUA). This EUA will remain in effect (meaning this test can be used) for the duration of the COVID-19 declaration under Section 564(b)(1) of the Act, 21 U.S.C. section 360bbb-3(b)(1), unless the authorization is terminated or revoked.     Resp Syncytial Virus by PCR NEGATIVE NEGATIVE Final    Comment: (NOTE) Fact Sheet for Patients: BloggerCourse.com  Fact Sheet for Healthcare Providers: SeriousBroker.it  This test is not yet approved or cleared by the [place] FDA and has been authorized for detection and/or diagnosis of SARS-CoV-2 by FDA under an Emergency Use Authorization (EUA). This EUA will remain in effect (meaning this test can be used) for the duration of the COVID-19 declaration under Section 564(b)(1) of the Act, 21 U.S.C. section 360bbb-3(b)(1), unless the authorization is terminated or revoked.  Performed at [facility], [address], [place], [place] [address]    Antimicrobials/Microbiology: Anti-infectives (From admission, onward)    Start     Dose/Rate Route Frequency Ordered Stop   06/01/23 2200  oseltamivir (TAMIFLU) capsule 30 mg        30 mg Oral 2 times daily 06/01/23 0502 06/06/23 0959   06/01/23 0515  oseltamivir (TAMIFLU) capsule 75 mg        75 mg Oral  Once 06/01/23 0500 06/01/23 0603         Component Value Date/Time   SDES  05/31/2023 1412    BLOOD LEFT ANTECUBITAL Performed at [facility], [address] [place], [place] [address]    SPECREQUEST  05/31/2023 1412    BOTTLES DRAWN AEROBIC ONLY Blood Culture results may not be optimal due to an inadequate volume of blood received in culture bottles Performed at  [facility], [address], [place], [place] [address]    CULT  05/31/2023 1412    NO GROWTH 3 DAYS Performed at [facility], [address] [place], [place] [address]    REPTSTATUS PENDING 05/31/2023 [phone]     Radiology Studies: No results found.    LOS: 0 days   Total time spent in review of labs and imaging, patient evaluation, formulation of plan, documentation and communication with patient/family: 35 minutes  Lanae Boast, MD [facility] 06/03/2023, 11:18 AM

## 2023-06-03 NOTE — Progress Notes (Signed)
 Physical Therapy Treatment Patient Details Name: [name] MRN: [id number] DOB: 01/22/1931 Today's Date: 06/03/2023   History of Present Illness 89 yo male admitted with Afib, falls, flu (+). Hx of CAD, BPH, gout, TURP    PT Comments  Pt in recliner with son present today. Pt is agreeable to complete session. Able to stand from recliner with 2WW at [facility], initiates mobility away from the recliner with need for inc cues related to walker/body position. He tends to push walker too far in front of him. Pt sits on bed, declines to amb further distance due to apprehension and has presence of SOB. Pt rests and returns to the recliner where he completes LAQ and marching x15 BLE in reciprocal pattern, cued for technique on LLE as bicycle movement of combined hip flexion and knee extension increases L knee pain. Isolated movements well tolerated. Son educated on technique adjustment. Pt demonstrates progression towards decreased assistance level during mobility.     If plan is discharge home, recommend the following: A little help with walking and/or transfers;A little help with bathing/dressing/bathroom;Assistance with cooking/housework;Assist for transportation;Help with stairs or ramp for entrance   Can travel by private vehicle     Yes  Equipment Recommendations  None recommended by PT    Recommendations for Other Services       Precautions / Restrictions Precautions Precautions: Fall Recall of Precautions/Restrictions: Intact Restrictions Weight Bearing Restrictions Per Provider Order: No     Mobility  Bed Mobility                    Transfers Overall transfer level: Needs assistance Equipment used: Rolling walker (2 wheels) Transfers: Sit to/from Stand Sit to Stand: Contact guard assist           General transfer comment: Pt in recliner when PT arrives. initiates mobility but not beyond the bed, declines to progress further today, agreeable to return to recliner     Ambulation/Gait Ambulation/Gait assistance: Contact guard assist Gait Distance (Feet): 6 Feet Assistive device: Rolling walker (2 wheels) Gait Pattern/deviations: Step-to pattern, Wide base of support, Decreased stride length, Trunk flexed Gait velocity: dec     General Gait Details: Pt pushes walker ahead of him and requires cues to maintain body position in walker. able to complete directional changes with VCs for sequencing   Stairs             Wheelchair Mobility     Tilt Bed    Modified Rankin (Stroke Patients Only)       Balance Overall balance assessment: Needs assistance Sitting-balance support: Bilateral upper extremity supported, Feet supported Sitting balance-Leahy Scale: Good     Standing balance support: Bilateral upper extremity supported, During functional activity, Reliant on assistive device for balance Standing balance-Leahy Scale: Poor                              Communication Communication Communication: No apparent difficulties Factors Affecting Communication: Hearing impaired  Cognition Arousal: Alert Behavior During Therapy: WFL for tasks assessed/performed   PT - Cognitive impairments: No apparent impairments                         Following commands: Intact      Cueing Cueing Techniques: Verbal cues  Exercises General Exercises - Lower Extremity Long Arc Quad: AROM, 15 reps, Both, Seated Hip Flexion/Marching: AROM, Both, 15 reps, Seated  General Comments General comments (skin integrity, edema, etc.): inc knee pain with combined hip flexion and knee extension, cued to isolate movements with HEP      Pertinent Vitals/Pain Pain Assessment Pain Assessment: Faces Faces Pain Scale: Hurts even more Pain Location: L knee Pain Descriptors / Indicators: Sharp, Grimacing Pain Intervention(s): Limited activity within patient's tolerance, Monitored during session, Repositioned    Home Living                           Prior Function            PT Goals (current goals can now be found in the care plan section) Acute Rehab PT Goals Patient Stated Goal: none stated PT Goal Formulation: Patient unable to participate in goal setting Time For Goal Achievement: 06/15/23 Potential to Achieve Goals: Fair Progress towards PT goals: Progressing toward goals    Frequency    Min 2X/week      PT Plan      Co-evaluation              AM-PAC PT "6 Clicks" Mobility   Outcome Measure  Help needed turning from your back to your side while in a flat bed without using bedrails?: A Little Help needed moving from lying on your back to sitting on the side of a flat bed without using bedrails?: A Little Help needed moving to and from a bed to a chair (including a wheelchair)?: A Little Help needed standing up from a chair using your arms (e.g., wheelchair or bedside chair)?: A Little Help needed to walk in hospital room?: A Little Help needed climbing 3-5 steps with a railing? : Total 6 Click Score: 16    End of Session Equipment Utilized During Treatment: Gait belt Activity Tolerance: Patient tolerated treatment well;Patient limited by fatigue Patient left: in chair;with call bell/phone within reach;with chair alarm set;with family/visitor present Nurse Communication: Mobility status PT Visit Diagnosis: Muscle weakness (generalized) (M62.81);History of falling (Z91.81);Difficulty in walking, not elsewhere classified (R26.2)     Time: 1610-9604 PT Time Calculation (min) (ACUTE ONLY): 25 min  Charges:    $Gait Training: 8-22 mins $Therapeutic Exercise: 8-22 mins PT General Charges $$ ACUTE PT VISIT: 1 Visit                     Madaline Guthrie, PT Acute Rehabilitation Services Office: [phone] 06/03/2023    Evelena Peat 06/03/2023, 3:14 PM

## 2023-06-03 NOTE — Consult Note (Addendum)
 Referring Provider: Anthoney Harada, NP Primary Care Physician:  Gaspar Garbe, MD Primary Gastroenterologist:  Dr. Lynett Fish gastroenterology  Reason for Consultation: Coffee-ground emesis, melena   HPI: [name] is a 89 y.o. male with a past medical history of arthritis, hypertension, hyperlipidemia, coronary artery disease s/p DES 12/2010, carotid stenosis, gout, glaucoma, subdural hematoma, GERD, diverticulosis, lower GI bleed when on Plavix and ASA (likely hemorrhoidal) 10/2011 and colon polyps.   He was admitted 05/30/2023 after falling at home. He was hypertensive in the ED and EKG showed new onset atrial fibrillation with controlled rate. Influenza A was positive. WBC 7.3.  Hemoglobin 11.5 (Hg 12.2 on 05/12/2023).  Hematocrit 36.  Platelets 138.  BUN 29 ( BUN 30 on 05/12/2023).  Creatinine 1.32 (Cr 1.2 on 05/12/2023).  Total bili 0.8.  Alk phos 62.  AST 43.  ALT 16.  Troponin 100 -> 135 -> 116.  TSH 0.297.  Blood cultures no growth at 48 hours.  SARS coronavirus negative.  Moderate urine hemoglobin. Head CT, C-spine x-ray, chest and pelvic x-rays were unrevealing.  He was evaluated by cardiology and placed on IV Heparin.  He developed coffee ground emesis x 1 and passed one melenic stool yesterday without further recurrence. Hemoglobin level dropped from 10.3-9.8 and Heparin was discontinued. He received IV Protonix. FOBT positive. A GI consult was requested for further evaluation for suspected upper GI bleed.  He has a history of GERD for which she takes Famotidine 20 mg daily at home.  No prior history of upper GI bleed or ulcers.  He denies ever having an EGD.  He has occasional heartburn approximately once or twice weekly without dysphagia.  No upper or lower abdominal pain.  He typically passes 1-2 normal formed brown bowel movements daily at home.  No bright red blood per the rectum.  His most recent colonoscopy was 10/2010 which showed severe diverticulosis in the descending and  sigmoid colon hemorrhoids.  Colonoscopy 04/2005 identified a 14 mm hyperplastic polyp removed from the colon.  Mother with history of gastric cancer.  No known family history of colorectal cancer.  He takes ASA 81 mg once daily.  He takes Ibuprofen 200 mg 1 tab daily for arthritis.  He denies having any cough or shortness of breath.  No alcohol since the early 1990s.  Remote tobacco use, quit smoking cigarettes in the 1960s.  ECHO/03/2023: There is increased flow velocity across the LV outflow tract, due to hyperdynamic left ventricular contractility. There does not appear to be true dynamic LV outflow obstruction. Left ventricular ejection fraction, by estimation, is 70 to 75%. The left ventricle has hyperdynamic function. The left ventricle has no regional wall motion abnormalities. Left ventricular diastolic function could not be evaluated. 1. Right ventricular systolic function is normal. The right ventricular size is normal. Tricuspid regurgitation signal is inadequate for assessing PA pressure. 2. 3. Left atrial size was mild to moderately dilated. 4. The mitral valve is normal in structure. No evidence of mitral valve regurgitation. The aortic valve is normal in structure. There is mild calcification of the aortic valve. Aortic valve regurgitation is mild. Aortic valve sclerosis is present, with no evidence of aortic valve stenosis.   GI PROCEDURES:  Colonoscopy 04/22/2005: 14 mm polyp removed at the hepatic flexure Left-sided diverticulosis COLON, HEPATIC FLEXURE POLYP: HYPERPLASTIC POLYP. NO   ADENOMATOUS CHANGE OR MALIGNANCY IDENTIFIED.   Colonoscopy 10/03/2010: Severe diverticulosis to the descending and sigmoid colon Internal hemorrhoids No further colonoscopies recommended due to  age  Past Medical History:  Diagnosis Date   Acute GI bleeding 10/30/2011   Anginal pain (HCC) 12/2010   Arthritis    "knees and left shoulder is completely shot"   BPH (benign prostatic hyperplasia)    CAD  (coronary artery disease)    RCA occluded (Cath [place] 1980s);  LHC 12/22/10: dLM 30%, prox to mid LAD 30%, oOM 95%, AV branches supplied collats to PDA, RCA occluded, EF 65%.  He underwent placement of a Promus DES to the OM.   Carotid stenosis    0 - 39% bilateral   Diverticulosis    Exertional dyspnea 10/30/2011   GERD (gastroesophageal reflux disease)    Glaucoma    Gout    Hyperlipidemia    Hypertension    Pneumonia 1950's    Past Surgical History:  Procedure Laterality Date   COLONOSCOPY     CORONARY ANGIOPLASTY WITH STENT PLACEMENT  12/2010   "1"   CYSTECTOMY     right cheek   EYE MUSCLE SURGERY  1953   right   TRANSURETHRAL RESECTION OF PROSTATE  ~ 2007    Prior to Admission medications   Medication Sig Start Date End Date Taking? Authorizing Provider  allopurinol (ZYLOPRIM) 300 MG tablet Take 300 mg by mouth Daily.  09/11/10  Yes [provider]  aspirin 81 MG tablet Take 81 mg by mouth daily.   Yes [provider]  docusate sodium (COLACE) 100 MG capsule Take 100 mg by mouth daily as needed (for constipation).   Yes [provider]  famotidine (PEPCID) 20 MG tablet Take 20 mg by mouth at bedtime. 04/03/21  Yes [provider]  finasteride (PROSCAR) 5 MG tablet Take 5 mg by mouth Daily.  09/12/10  Yes [provider]  fluticasone (FLONASE) 50 MCG/ACT nasal spray Place 1 spray into both nostrils 2 (two) times daily as needed for allergies or rhinitis.   Yes [provider]  hydrochlorothiazide (HYDRODIURIL) 25 MG tablet Take 25 mg by mouth daily.   Yes [provider]  latanoprost (XALATAN) 0.005 % ophthalmic solution Place 1 drop into the left eye at bedtime. 04/14/19  Yes [provider]  lisinopril (PRINIVIL,ZESTRIL) 40 MG tablet Take 40 mg by mouth Daily.   Yes [provider]  metoprolol tartrate (LOPRESSOR) 25 MG tablet TAKE 1 BY MOUTH TWICE DAILY Patient taking differently: Take 25 mg by  mouth 2 (two) times daily. 03/12/15  Yes Rollene Rotunda, MD  simvastatin (ZOCOR) 80 MG tablet Take 80 mg by mouth at bedtime.   Yes [provider]  tamsulosin (FLOMAX) 0.4 MG CAPS capsule Take 0.4 mg by mouth daily.   Yes [provider]  timolol (TIMOPTIC) 0.5 % ophthalmic solution Place 1 drop into the left eye 2 (two) times daily. 04/05/18  Yes [provider]  nitroGLYCERIN (NITROSTAT) 0.4 MG SL tablet Place 1 tablet (0.4 mg total) under the tongue every 5 (five) minutes as needed. Patient not taking: Reported on 06/01/2023 04/07/16   Rollene Rotunda, MD    Current Facility-Administered Medications  Medication Dose Route Frequency Provider Last Rate Last Admin   aspirin EC tablet 81 mg  81 mg Oral Daily Eduard Clos, MD   81 mg at 06/02/23 1610   atorvastatin (LIPITOR) tablet 40 mg  40 mg Oral Daily Eduard Clos, MD   40 mg at 06/02/23 9604   hydrALAZINE (APRESOLINE) injection 5 mg  5 mg Intravenous Q4H PRN Eduard Clos, MD  5 mg at 06/01/23 0659   latanoprost (XALATAN) 0.005 % ophthalmic solution 1 drop  1 drop Left Eye QHS Kc, Dayna Barker, MD   1 drop at 06/02/23 2102   lisinopril (ZESTRIL) tablet 40 mg  40 mg Oral Daily Eduard Clos, MD   40 mg at 06/02/23 1610   melatonin tablet 5 mg  5 mg Oral QHS PRN Anthoney Harada, NP   5 mg at 06/02/23 2108   metoprolol tartrate (LOPRESSOR) tablet 37.5 mg  37.5 mg Oral BID Weston Brass A, MD   37.5 mg at 06/02/23 2108   oseltamivir (TAMIFLU) capsule 30 mg  30 mg Oral BID Eduard Clos, MD   30 mg at 06/02/23 2109   pantoprazole (PROTONIX) injection 40 mg  40 mg Intravenous Q12H Anthoney Harada, NP   40 mg at 06/03/23 0011   predniSONE (DELTASONE) tablet 40 mg  40 mg Oral Q breakfast Standley Brooking, MD   40 mg at 06/02/23 0825   timolol (TIMOPTIC) 0.5 % ophthalmic solution 1 drop  1 drop Left Eye BID Lanae Boast, MD   1 drop at 06/02/23 2109    Allergies as of 05/31/2023   (No Known  Allergies)    Family History  Problem Relation Age of Onset   Prostate cancer Father 32   Cancer Mother        Stomach   Colon cancer Neg Hx     Social History   Socioeconomic History   Marital status: Married    Spouse name: Not on file   Number of children: 3   Years of education: Not on file   Highest education level: Not on file  Occupational History   Occupation: Retired  Tobacco Use   Smoking status: Former    Current packs/day: 0.00    Average packs/day: 3.0 packs/day for 14.0 years (42.0 ttl pk-yrs)    Types: Cigarettes    Start date: 12/11/1952    Quit date: 12/12/1966    Years since quitting: 56.5   Smokeless tobacco: Never  Vaping Use   Vaping status: Never Used  Substance and Sexual Activity   Alcohol use: Yes    Comment: 10/30/2011 "stopped all alcohol 2007; used to drink too much"   Drug use: No   Sexual activity: Never  Other Topics Concern   Not on file  Social History Narrative   Not on file   Social Drivers of Health   Financial Resource Strain: Not on file  Food Insecurity: No Food Insecurity (06/01/2023)   Hunger Vital Sign    Worried About Running Out of Food in the Last Year: Never true    Ran Out of Food in the Last Year: Never true  Transportation Needs: No Transportation Needs (06/01/2023)   PRAPARE - Administrator, Civil Service (Medical): No    Lack of Transportation (Non-Medical): No  Physical Activity: Not on file  Stress: Not on file  Social Connections: Unknown (06/01/2023)   Social Connection and Isolation Panel [NHANES]    Frequency of Communication with Friends and Family: Three times a week    Frequency of Social Gatherings with Friends and Family: Patient unable to answer    Attends Religious Services: Patient unable to answer    Active Member of Clubs or Organizations: Patient unable to answer    Attends Banker Meetings: Patient unable to answer    Marital Status: Patient unable to answer  Intimate  Partner Violence: Not At Risk (06/01/2023)  Humiliation, Afraid, Rape, and Kick questionnaire    Fear of Current or Ex-Partner: No    Emotionally Abused: No    Physically Abused: No    Sexually Abused: No    Review of Systems: Gen: Denies fever, sweats or chills. No weight loss.  CV: Denies chest pain, palpitations or edema. Resp: Denies cough, shortness of breath of hemoptysis.  GI: See HPI.   GU : Denies urinary burning, blood in urine, increased urinary frequency or incontinence. MS: Denies joint pain, muscles aches or weakness. Derm: Denies rash, itchiness, skin lesions or unhealing ulcers. Psych: No anxiety or depression. Heme: Denies easy bruising, bleeding. Neuro:  Denies headaches, dizziness or paresthesias. Endo:  Denies any problems with DM, thyroid or adrenal function.  Physical Exam: Vital signs in last 24 hours: Temp:  [97.5 F (36.4 C)-97.7 F (36.5 C)] 97.7 F (36.5 C) (04/03 0444) Pulse Rate:  [59-93] 59 (04/03 0444) Resp:  [16-22] 20 (04/03 0444) BP: (96-139)/(56-94) 115/64 (04/03 0444) SpO2:  [95 %-97 %] 95 % (04/03 0444) Last BM Date : 06/02/23 General:  Alert 89 year old male in no acute distress. Head:  Normocephalic and atraumatic. Eyes: Right eye deformity secondary to injury at the age of 45.  Conjunctiva pink. Ears:  Normal auditory acuity. Nose:  No deformity, discharge or lesions. Mouth:  Dentition intact. No ulcers or lesions.  Neck:  Supple. No lymphadenopathy or thyromegaly.  Lungs: Breath sounds coarse with few scattered wheezes, decreased in the bases.  On room air. Heart: Regular rhythm, no murmurs. Abdomen: Soft, nondistended.  Positive bowel sounds to all 4 quadrants. Rectal: Deferred. Musculoskeletal:  Symmetrical without gross deformities.  Pulses:  Normal pulses noted. Extremities: Upper extremity edema and ecchymosis, RUE > LUE. Neurologic:  Alert and  oriented x 4.  Speech is clear.  Moves all extremities equally. Skin: Area of  ecchymosis to the RUE, scattered ecchymosis to the LUE. Psych:  Alert and cooperative. Normal mood and affect.  Intake/Output from previous day: 04/02 0701 - 04/03 0700 In: 766.4 [P.O.:600; I.V.:166.4] Out: 1400 [Urine:1400] Intake/Output this shift: No intake/output data recorded.  Lab Results: Recent Labs    06/01/23 0409 06/02/23 0445 06/03/23 0104  WBC 8.8 13.8* 18.3*  HGB 11.5* 10.3* 9.8*  HCT 37.2* 31.5* 31.0*  PLT 118* 132* 171   BMET Recent Labs    06/01/23 0409 06/02/23 0445 06/03/23 0104  NA 134* 133* 130*  K 3.5 3.1* 4.2  CL 102 100 101  CO2 22 24 21*  GLUCOSE 119* 141* 135*  BUN 26* 50* 88*  CREATININE 1.23 1.44* 1.41*  CALCIUM 9.0 8.7* 8.6*   LFT Recent Labs    06/01/23 0409  PROT 5.8*  ALBUMIN 3.2*  AST 66*  ALT 19  ALKPHOS 55  BILITOT 1.1   PT/INR No results for input(s): "LABPROT", "INR" in the last 72 hours. Hepatitis Panel No results for input(s): "HEPBSAG", "HCVAB", "HEPAIGM", "HEPBIGM" in the last 72 hours.    Studies/Results: ECHOCARDIOGRAM COMPLETE Result Date: 06/01/2023    ECHOCARDIOGRAM REPORT   Patient Name:   NAYSON TRAWEEK Date of Exam: 06/01/2023 Medical Rec #:  [id number]   Height:       68.0 in Accession #:    [id number]  Weight:       173.1 lb Date of Birth:  [birth year]    BSA:          1.922 m Patient Age:    89 years    BP:  164/67 mmHg Patient Gender: M           HR:           90 bpm. Exam Location:  Inpatient Procedure: 2D Echo, Cardiac Doppler and Color Doppler (Both Spectral and Color            Flow Doppler were utilized during procedure). Indications:    Atrial fibrillation  History:        Patient has no prior history of Echocardiogram examinations.                 CAD, Arrythmias:Atrial Fibrillation; Risk Factors:Dyslipidemia                 and Hypertension. Carotid stenosis.  Sonographer:    Vern Claude Referring Phys: [id number] Eduard Clos  Sonographer Comments: Image acquisition challenging due to respiratory  motion. IMPRESSIONS  1. There is increased flow velocity across the LV outflow tract, due to hyperdynamic left ventricular contractility. There does not appear to be true dynamic LV outflow obstruction. Left ventricular ejection fraction, by estimation, is 70 to 75%. The left ventricle has hyperdynamic function. The left ventricle has no regional wall motion abnormalities. Left ventricular diastolic function could not be evaluated.  2. Right ventricular systolic function is normal. The right ventricular size is normal. Tricuspid regurgitation signal is inadequate for assessing PA pressure.  3. Left atrial size was mild to moderately dilated.  4. The mitral valve is normal in structure. No evidence of mitral valve regurgitation.  5. The aortic valve is normal in structure. There is mild calcification of the aortic valve. Aortic valve regurgitation is mild. Aortic valve sclerosis is present, with no evidence of aortic valve stenosis. FINDINGS  Left Ventricle: There is increased flow velocity across the LV outflow tract, due to hyperdynamic left ventricular contractility. There does not appear to be true dynamic LV outflow obstruction. Left ventricular ejection fraction, by estimation, is 70 to 75%. The left ventricle has hyperdynamic function. The left ventricle has no regional wall motion abnormalities. The left ventricular internal cavity size was normal in size. There is borderline concentric left ventricular hypertrophy. Left ventricular diastolic function could not be evaluated due to atrial fibrillation. Left ventricular diastolic function could not be evaluated. Right Ventricle: The right ventricular size is normal. No increase in right ventricular wall thickness. Right ventricular systolic function is normal. Tricuspid regurgitation signal is inadequate for assessing PA pressure. Left Atrium: Left atrial size was mild to moderately dilated. Right Atrium: Right atrial size was normal in size. Pericardium: There  is no evidence of pericardial effusion. Mitral Valve: The mitral valve is normal in structure. No evidence of mitral valve regurgitation. MV peak gradient, 6.8 mmHg. The mean mitral valve gradient is 3.0 mmHg. Tricuspid Valve: The tricuspid valve is normal in structure. Tricuspid valve regurgitation is not demonstrated. Aortic Valve: The aortic valve is normal in structure. There is mild calcification of the aortic valve. Aortic valve regurgitation is mild. Aortic valve sclerosis is present, with no evidence of aortic valve stenosis. Aortic valve mean gradient measures 5.3 mmHg. Aortic valve peak gradient measures 10.8 mmHg. Aortic valve area, by VTI measures 3.00 cm. Pulmonic Valve: The pulmonic valve was not well visualized. Pulmonic valve regurgitation is not visualized. Aorta: The aortic root was not well visualized. Venous: The inferior vena cava was not well visualized. IAS/Shunts: No atrial level shunt detected by color flow Doppler.  LEFT VENTRICLE PLAX 2D LVIDd:  3.50 cm      Diastology LVIDs:         2.30 cm      LV e' medial:    10.00 cm/s LV PW:         1.20 cm      LV E/e' medial:  10.8 LV IVS:        1.10 cm      LV e' lateral:   12.80 cm/s LVOT diam:     1.92 cm      LV E/e' lateral: 8.4 LV SV:         71 LV SV Index:   37 LVOT Area:     2.90 cm  LV Volumes (MOD) LV vol d, MOD A2C: 168.0 ml LV vol d, MOD A4C: 168.0 ml LV vol s, MOD A2C: 53.2 ml LV vol s, MOD A4C: 65.0 ml LV SV MOD A2C:     114.8 ml LV SV MOD A4C:     168.0 ml LV SV MOD BP:      108.2 ml RIGHT VENTRICLE RV Basal diam:  3.00 cm RV Mid diam:    2.50 cm RV S prime:     16.60 cm/s TAPSE (M-mode): 2.0 cm LEFT ATRIUM             Index        RIGHT ATRIUM           Index LA diam:        3.60 cm 1.87 cm/m   RA Area:     10.60 cm LA Vol (A2C):   61.2 ml 31.84 ml/m  RA Volume:   18.40 ml  9.57 ml/m LA Vol (A4C):   99.9 ml 51.98 ml/m LA Biplane Vol: 85.3 ml 44.38 ml/m  AORTIC VALVE                     PULMONIC VALVE AV Area (Vmax):     3.39 cm      PV Vmax:       1.42 m/s AV Area (Vmean):   2.97 cm      PV Peak grad:  8.1 mmHg AV Area (VTI):     3.00 cm AV Vmax:           164.00 cm/s AV Vmean:          101.033 cm/s AV VTI:            0.235 m AV Peak Grad:      10.8 mmHg AV Mean Grad:      5.3 mmHg LVOT Vmax:         192.00 cm/s LVOT Vmean:        103.500 cm/s LVOT VTI:          0.244 m LVOT/AV VTI ratio: 1.04  AORTA Ao Root diam: 2.70 cm Ao Asc diam:  2.60 cm MITRAL VALVE MV Area (PHT): 5.54 cm     SHUNTS MV Peak grad:  6.8 mmHg     Systemic VTI:  0.24 m MV Mean grad:  3.0 mmHg     Systemic Diam: 1.92 cm MV Vmax:       1.30 m/s MV Vmean:      79.5 cm/s MV Decel Time: 137 msec MV E velocity: 108.00 cm/s Rachelle Hora Croitoru MD Electronically signed by Thurmon Fair MD Signature Date/Time: 06/01/2023/9:08:30 AM    Final     IMPRESSION/PLAN:  89 year old male admitted 05/31/2023 secondary to fall at home diagnosed with acute  bronchitis with influenza A -Management per the medical service  Coffee-ground emesis/melena x 1 on 4/2 -NPO  -Continue Pantoprazole IV twice daily -Continue to hold Heparin -EGD when respiratory and cardiac status stable, defer further endoscopic recommendations to Dr. Marina Goodell  GERD. Chronic NSAID use. On Famotidine 20mg  every day at home.  -See plan above  Anemia, secondary to UGI bleed. Admission hemoglobin 11.5 -> 10.3 -> 9.8.  Vitamin B12 level 626.  Folate > 40. -Transfuse for hemoglobin less than 8 -CBC and iron panel in a.m.  AKI, Cr 1.32 -> 1.41.  Hyponatremia. Na+ 130.  Mildly elevated AST level 43 -> 66. -Hepatic panel in a.m.  Atrial fibrillation, controlled rate, new onset. CHADS2 score of 3. Heparin discontinued secondary to CGE/melena.   History of CAD s/p stent placement.  On ASA 81 mg daily. LV EF 70 - 75% per ECHO 06/01/2023.  Hypertensive urgency  Low TSH, no prior history of hyperthyroidism  Arnaldo Natal  06/03/2023, 9:34 AM   GI ATTENDING  History, laboratories,  x-rays, prior colonoscopy report all personally reviewed.  Patient personally seen and examined.  Agree with comprehensive consultation note as outlined above.  Delightful 89 year old male with multiple medical problems as outlined.  The patient is sitting comfortably in his bedside chair when I visited him in the room.  No complaints.  No bowel movements.  We are asked to see him for hematemesis and melena.  Associated drop in hemoglobin and rising BUN (all consistent with acute upper GI bleed).  This while on newly started anticoagulation for atrial fibrillation.  Uses NSAIDs at home.  I suspect NSAID induced ulcer with bleeding in the face of anticoagulation.  Now on PPI.  Off anticoagulation.  Multiple other issues as noted.  Optimally, would like to perform upper endoscopy to confirm diagnosis and assist with risk stratification of anticoagulation moving forward, if this is important.  Continue PPI.  Correct hyponatremia.  Patient is high risk due to his age and comorbidities.The nature of the procedure, as well as the risks, benefits, and alternatives were carefully and thoroughly reviewed with the patient. Ample time for discussion and questions allowed. The patient understood, was satisfied, and agreed to proceed.  We will reassess him in the morning.  He understands.  Wilhemina Bonito. Eda Keys., M.D. [facility] Division of Gastroenterology

## 2023-06-03 NOTE — TOC Progression Note (Addendum)
 Transition of Care [facility]) - Progression Note    Patient Details  Name: [name] MRN: [id number] Date of Birth: 01-31-1931  Transition of [facility] CM/SW Contact  Larrie Kass, LCSW Phone Number: 06/03/2023, 9:36 AM  Clinical Narrative:     Pt's son has [facility] for his father SNF placement. CSW to star insurance authorization. TOC to follow.  ADDEN 11:28am Pt's insurance Berkley Harvey was approved auth ID: [id number],  06/03/2023-06/07/2023. TOC to follow.   Expected Discharge Plan: Skilled Nursing Facility Barriers to Discharge: Continued Medical Work up  Expected Discharge Plan and Services       Living arrangements for the past 2 months: Single Family Home                                       Social Determinants of Health (SDOH) Interventions SDOH Screenings   Food Insecurity: No Food Insecurity (06/01/2023)  Housing: Low Risk  (06/01/2023)  Transportation Needs: No Transportation Needs (06/01/2023)  Utilities: Not At Risk (06/01/2023)  Social Connections: Unknown (06/01/2023)  Tobacco Use: Medium Risk (05/31/2023)    Readmission Risk Interventions     No data to display

## 2023-06-04 ENCOUNTER — Encounter (HOSPITAL_COMMUNITY): Payer: Self-pay | Admitting: Internal Medicine

## 2023-06-04 ENCOUNTER — Inpatient Hospital Stay (HOSPITAL_COMMUNITY): Admitting: Anesthesiology

## 2023-06-04 ENCOUNTER — Encounter (HOSPITAL_COMMUNITY)
Admission: EM | Disposition: A | Discharge status: Skilled Nursing Facility | Payer: Self-pay | Source: Home / Self Care | Attending: Internal Medicine

## 2023-06-04 DIAGNOSIS — K219 Gastro-esophageal reflux disease without esophagitis: Secondary | ICD-10-CM | POA: Diagnosis not present

## 2023-06-04 DIAGNOSIS — K269 Duodenal ulcer, unspecified as acute or chronic, without hemorrhage or perforation: Secondary | ICD-10-CM | POA: Diagnosis not present

## 2023-06-04 DIAGNOSIS — K222 Esophageal obstruction: Secondary | ICD-10-CM

## 2023-06-04 DIAGNOSIS — I10 Essential (primary) hypertension: Secondary | ICD-10-CM | POA: Diagnosis not present

## 2023-06-04 DIAGNOSIS — K921 Melena: Secondary | ICD-10-CM | POA: Diagnosis not present

## 2023-06-04 DIAGNOSIS — I2510 Atherosclerotic heart disease of native coronary artery without angina pectoris: Secondary | ICD-10-CM | POA: Diagnosis not present

## 2023-06-04 DIAGNOSIS — E871 Hypo-osmolality and hyponatremia: Secondary | ICD-10-CM

## 2023-06-04 DIAGNOSIS — K3189 Other diseases of stomach and duodenum: Secondary | ICD-10-CM | POA: Diagnosis not present

## 2023-06-04 DIAGNOSIS — K920 Hematemesis: Secondary | ICD-10-CM | POA: Diagnosis not present

## 2023-06-04 DIAGNOSIS — D62 Acute posthemorrhagic anemia: Secondary | ICD-10-CM | POA: Diagnosis not present

## 2023-06-04 DIAGNOSIS — J189 Pneumonia, unspecified organism: Secondary | ICD-10-CM | POA: Diagnosis not present

## 2023-06-04 DIAGNOSIS — I4819 Other persistent atrial fibrillation: Secondary | ICD-10-CM | POA: Diagnosis not present

## 2023-06-04 HISTORY — PX: BIOPSY OF SKIN SUBCUTANEOUS TISSUE AND/OR MUCOUS MEMBRANE: SHX6741

## 2023-06-04 HISTORY — PX: ESOPHAGOGASTRODUODENOSCOPY: SHX5428

## 2023-06-04 LAB — BASIC METABOLIC PANEL WITH GFR
Anion gap: 10 (ref 5–15)
BUN: 87 mg/dL — ABNORMAL HIGH (ref 8–23)
CO2: 20 mmol/L — ABNORMAL LOW (ref 22–32)
Calcium: 9.1 mg/dL (ref 8.9–10.3)
Chloride: 103 mmol/L (ref 98–111)
Creatinine, Ser: 1.2 mg/dL (ref 0.61–1.24)
GFR, Estimated: 57 mL/min — ABNORMAL LOW (ref >60)
Glucose, Bld: 105 mg/dL — ABNORMAL HIGH (ref 70–99)
Potassium: 3.9 mmol/L (ref 3.5–5.1)
Sodium: 133 mmol/L — ABNORMAL LOW (ref 135–145)

## 2023-06-04 LAB — CBC
HCT: 26.1 % — ABNORMAL LOW (ref 39.0–52.0)
Hemoglobin: 8 g/dL — ABNORMAL LOW (ref 13.0–17.0)
MCH: 31.4 pg (ref 26.0–34.0)
MCHC: 30.7 g/dL (ref 30.0–36.0)
MCV: 102.4 fL — ABNORMAL HIGH (ref 80.0–100.0)
Platelets: 141 10*3/uL — ABNORMAL LOW (ref 150–400)
RBC: 2.55 MIL/uL — ABNORMAL LOW (ref 4.22–5.81)
RDW: 14.7 % (ref 11.5–15.5)
WBC: 11.6 10*3/uL — ABNORMAL HIGH (ref 4.0–10.5)
nRBC: 0 % (ref 0.0–0.2)

## 2023-06-04 LAB — POCT I-STAT, CHEM 8
BUN: 86 mg/dL — ABNORMAL HIGH (ref 8–23)
Calcium, Ion: 1.36 mmol/L (ref 1.15–1.40)
Chloride: 103 mmol/L (ref 98–111)
Creatinine, Ser: 1.7 mg/dL — ABNORMAL HIGH (ref 0.61–1.24)
Glucose, Bld: 84 mg/dL (ref 70–99)
HCT: 28 % — ABNORMAL LOW (ref 39.0–52.0)
Hemoglobin: 9.5 g/dL — ABNORMAL LOW (ref 13.0–17.0)
Potassium: 3.7 mmol/L (ref 3.5–5.1)
Sodium: 137 mmol/L (ref 135–145)
TCO2: 23 mmol/L (ref 22–32)

## 2023-06-04 LAB — PREPARE RBC (CROSSMATCH)

## 2023-06-04 LAB — HEMOGLOBIN AND HEMATOCRIT, BLOOD
HCT: 28.8 % — ABNORMAL LOW (ref 39.0–52.0)
HCT: 24.9 % — ABNORMAL LOW (ref 39.0–52.0)
Hemoglobin: 8.8 g/dL — ABNORMAL LOW (ref 13.0–17.0)
Hemoglobin: 7.7 g/dL — ABNORMAL LOW (ref 13.0–17.0)

## 2023-06-04 LAB — MRSA NEXT GEN BY PCR, NASAL: MRSA by PCR Next Gen: NOT DETECTED

## 2023-06-04 SURGERY — EGD (ESOPHAGOGASTRODUODENOSCOPY)
Anesthesia: Monitor Anesthesia Care

## 2023-06-04 MED ORDER — HALOPERIDOL LACTATE 5 MG/ML IJ SOLN
2.0000 mg | Freq: Four times a day (QID) | INTRAMUSCULAR | Status: DC | PRN
  Administered 2023-06-04: 2 mg via INTRAVENOUS
  Filled 2023-06-04: qty 1

## 2023-06-04 MED ORDER — PHENYLEPHRINE 80 MCG/ML (10ML) SYRINGE FOR IV PUSH (FOR BLOOD PRESSURE SUPPORT)
PREFILLED_SYRINGE | INTRAVENOUS | Status: DC | PRN
  Administered 2023-06-04 (×2): 160 ug via INTRAVENOUS

## 2023-06-04 MED ORDER — PROPOFOL 10 MG/ML IV BOLUS
INTRAVENOUS | Status: DC | PRN
  Administered 2023-06-04: 20 mg via INTRAVENOUS

## 2023-06-04 MED ORDER — ORAL CARE MOUTH RINSE: 15.0000 mL | OROMUCOSAL | Status: DC | PRN

## 2023-06-04 MED ORDER — PROPOFOL 500 MG/50ML IV EMUL
INTRAVENOUS | Status: DC | PRN
  Administered 2023-06-04: 150 ug/kg/min via INTRAVENOUS

## 2023-06-04 MED ORDER — SODIUM CHLORIDE 0.9 % IV SOLN
INTRAVENOUS | Status: AC | PRN
  Administered 2023-06-04: 500 mL via INTRAMUSCULAR

## 2023-06-04 MED ORDER — CHLORHEXIDINE GLUCONATE CLOTH 2 % EX PADS
6.0000 | MEDICATED_PAD | Freq: Every day | CUTANEOUS | Status: DC
  Administered 2023-06-04 – 2023-06-07 (×4): 6 via TOPICAL

## 2023-06-04 MED ORDER — LACTATED RINGERS IV SOLN: INTRAVENOUS | Status: AC

## 2023-06-04 MED ORDER — LIDOCAINE 2% (20 MG/ML) 5 ML SYRINGE
INTRAMUSCULAR | Status: DC | PRN
  Administered 2023-06-04: 40 mg via INTRAVENOUS

## 2023-06-04 NOTE — Transfer of Care (Signed)
 Immediate Anesthesia Transfer of Care Note  Patient: [name]  Procedure(s) Performed: EGD (ESOPHAGOGASTRODUODENOSCOPY) BIOPSY, SKIN, SUBCUTANEOUS TISSUE, OR MUCOUS MEMBRANE  Patient Location: Endoscopy Unit  Anesthesia Type:MAC  Level of Consciousness: drowsy  Airway & Oxygen Therapy: Patient Spontanous Breathing and Patient connected to nasal cannula oxygen  Post-op Assessment: Report given to RN and Post -op Vital signs reviewed and stable  Post vital signs: Reviewed and stable  Last Vitals:  Vitals Value Taken Time  BP    Temp    Pulse    Resp    SpO2      Last Pain:  Vitals:   06/04/23 1414  TempSrc: Temporal  PainSc: 0-No pain         Complications: No notable events documented.

## 2023-06-04 NOTE — Plan of Care (Signed)

## 2023-06-04 NOTE — Progress Notes (Signed)
 PROGRESS NOTE [name]  YQI:[id number] DOB: 1930/10/16 DOA: 05/31/2023 PCP: Gaspar Garbe, MD  Brief Narrative/Hospital Course: 89 year old man presented after a fall at home.  PMH CAD, PCI.  Evaluation revealed influenza A, atrial fibrillation rate controlled.  Patient admitted for flu and new A-fib.  Consultants Cardiology  Gastroenterology  Procedures/Events None  Subjective: Seen and examined  On bedside chair no complaint resting comfortably  Has not had a bowel movement  today since overnight Labs showed hemoglobin  down to 8.0 g  Assessment and plan:  Acute bronchitis with influenza A infection: Respiratory status stable.Cont on Tamiflu,  prednisone tapering off.  New onset A-fib  Elevated troponin-from demand ischemia: Rate controlled.PTA on metoprolol 25 twice daily.  Echo reassuring.  Cardiology following input appreciated  TSH depressed but normal FT4 Plan is to cont metoprolol, aspirin 81. Patient high risk for bleeding/trauma not a ideal candidate for anticoagulation for long-term-heparin has been discontinued due to anemia and GI bleeding   Coffee-ground emesis ABLA Leukocytosis: No bowel movement  now.GI following closely heparin has been discontinued 4/3. Trend hemoglobin, continue PPI twice daily.  Leukocytosis downtrending Continue to monitor hemoglobin transfuse if less than 8 gm.  GI planning for scope today Recent Labs  Lab 05/31/23 1410 06/01/23 0409 06/02/23 0445 06/03/23 0104 06/04/23 0348  HGB 11.5* 11.5* 10.3* 9.8* 8.0*  HCT 36.0* 37.2* 31.5* 31.0* 26.1*    Macrocytic anemia  Thrombocytopenia B12 folate level stable.  Monitor counts.   Low TSH Normal free T4, monitor as outpatient  Hypokalemia: Resolved  Hypertensive urgency  BP stable currently on metoprolol, lisinopril -continue with caution   Fall at home [facility] Continue PT OT and planning for SNF at discharge   Elevated creatinine on admission 1.32: last values from  2014.  Level fluctuating from 1.2-1.4 suspect CKD stage IIIb.  Creatinine 1.2 this morning. Off ivf Recent Labs    05/31/23 1410 06/01/23 0409 06/02/23 0445 06/03/23 0104 06/04/23 0348  BUN 29* 26* 50* 88* 87*  CREATININE 1.32* 1.23 1.44* 1.41* 1.20  CO2 25 22 24  21* 20*  K 3.7 3.5 3.1* 4.2 3.9    Hyperlipidemia: Continue statin   DVT prophylaxis:  Code Status:   Code Status: Full Code Family Communication: plan of care discussed with patient at bedside. Patient status is: Remains hospitalized because of severity of illness Level of care: Telemetry   Dispo: The patient is from: home            Anticipated disposition: SNF once ok w/ cardio 1-2 days  Objective: Vitals last 24 hrs: Vitals:   06/03/23 1326 06/03/23 2156 06/04/23 0601 06/04/23 0912  BP: 122/72 135/74 (!) 147/53 (!) 119/59  Pulse: 66 (!) 59 65 60  Resp: 18 18 20    Temp: 97.6 F (36.4 C) 97.6 F (36.4 C) 97.7 F (36.5 C)   TempSrc: Oral Oral Oral   SpO2: 99% 95% 98%   Weight:      Height:       Weight change:   Physical Examination: General exam: alert awake, oriented pleasant HEENT:Oral mucosa moist, Ear/Nose WNL grossly Respiratory system: Bilaterally clear BS,no use of accessory muscle Cardiovascular system: S1 & S2 +, No JVD. Gastrointestinal system: Abdomen soft,NT,ND, BS+ Nervous System: Alert, awake, moving all extremities,and following commands. Extremities: LE edema neg,distal peripheral pulses palpable and warm.  Skin: No rashes,no icterus. MSK: Normal muscle bulk,tone, power   Medications reviewed:  Scheduled Meds:  aspirin EC  81 mg Oral Daily  atorvastatin  40 mg Oral Daily   famotidine  20 mg Oral QHS   latanoprost  1 drop Left Eye QHS   lisinopril  40 mg Oral Daily   metoprolol tartrate  37.5 mg Oral BID   oseltamivir  30 mg Oral BID   pantoprazole (PROTONIX) IV  40 mg Intravenous Q12H   predniSONE  20 mg Oral Q breakfast   tamsulosin  0.4 mg Oral Daily   timolol  1 drop  Left Eye BID  Continuous Infusions:   Diet Order             Diet NPO time specified  Diet effective midnight                  Intake/Output Summary (Last 24 hours) at 06/04/2023 1126 Last data filed at 06/04/2023 0900 Gross per 24 hour  Intake 580 ml  Output --  Net 580 ml   Net IO Since Admission: 1,528.47 mL [06/04/23 1126]  Wt Readings from Last 3 Encounters:  06/01/23 78.5 kg  04/28/18 84.2 kg  04/13/17 81.6 kg     Unresulted Labs (From admission, onward)     Start     Ordered   06/04/23 1125  Hemoglobin and hematocrit, blood  Now then every 8 hours,   R (with TIMED occurrences)      06/04/23 1124   06/04/23 0500  Basic metabolic panel with GFR  Daily,   R      06/03/23 1118   06/03/23 0500  CBC  Daily,   R      06/02/23 1405          Data Reviewed: I have personally reviewed following labs and imaging studies ( see epic result tab) CBC: Recent Labs  Lab 05/31/23 1410 06/01/23 0409 06/02/23 0445 06/03/23 0104 06/04/23 0348  WBC 7.3 8.8 13.8* 18.3* 11.6*  NEUTROABS 5.5  --   --   --   --   HGB 11.5* 11.5* 10.3* 9.8* 8.0*  HCT 36.0* 37.2* 31.5* 31.0* 26.1*  MCV 98.6 103.3* 96.6 101.0* 102.4*  PLT 138* 118* 132* 171 141*   CMP: Recent Labs  Lab 05/31/23 1410 06/01/23 0409 06/02/23 0445 06/03/23 0104 06/04/23 0348  NA 137 134* 133* 130* 133*  K 3.7 3.5 3.1* 4.2 3.9  CL 102 102 100 101 103  CO2 25 22 24  21* 20*  GLUCOSE 127* 119* 141* 135* 105*  BUN 29* 26* 50* 88* 87*  CREATININE 1.32* 1.23 1.44* 1.41* 1.20  CALCIUM 9.3 9.0 8.7* 8.6* 9.1  MG 1.7  --   --   --   --    GFR: Estimated Creatinine Clearance: 38.8 mL/min (by C-G formula based on SCr of 1.2 mg/dL). Recent Labs  Lab 05/31/23 1410 06/01/23 0409  AST 43* 66*  ALT 16 19  ALKPHOS 62 55  BILITOT 0.8 1.1  PROT 6.2* 5.8*  ALBUMIN 3.5 3.2*   Antimicrobials/Microbiology: Anti-infectives (From admission, onward)    Start     Dose/Rate Route Frequency Ordered Stop   06/01/23 2200   oseltamivir (TAMIFLU) capsule 30 mg        30 mg Oral 2 times daily 06/01/23 0502 06/06/23 0959   06/01/23 0515  oseltamivir (TAMIFLU) capsule 75 mg        75 mg Oral  Once 06/01/23 0500 06/01/23 0603         Component Value Date/Time   SDES  05/31/2023 1412    BLOOD LEFT ANTECUBITAL Performed at  [facility], [address], [place], [place] [address]    SPECREQUEST  05/31/2023 1412    BOTTLES DRAWN AEROBIC ONLY Blood Culture results may not be optimal due to an inadequate volume of blood received in culture bottles Performed at [facility], [address] [place], [place] [address]    CULT  05/31/2023 1412    NO GROWTH 4 DAYS Performed at [facility], [address] [place], [place] [address]    REPTSTATUS PENDING 05/31/2023 [phone]     Radiology Studies: No results found.   LOS: 1 day   Total time spent in review of labs and imaging, patient evaluation, formulation of plan, documentation and communication with patient/family: 35 minutes  Lanae Boast, MD [facility] 06/04/2023, 11:26 AM

## 2023-06-04 NOTE — Progress Notes (Addendum)
 [facility] Progress Note  CC:  Coffee-ground emesis, melena   Subjective: He is sitting up in the chair. No N/V. No abdominal pain. No BM or melena today. He stated passing a brown BM yesterday. No CP or SOB.   Objective:  Vital signs in last 24 hours: Temp:  [97.6 F (36.4 C)-97.7 F (36.5 C)] 97.7 F (36.5 C) (04/04 0601) Pulse Rate:  [59-66] 60 (04/04 0912) Resp:  [18-20] 20 (04/04 0601) BP: (119-147)/(53-74) 119/59 (04/04 0912) SpO2:  [95 %-99 %] 98 % (04/04 0601) Last BM Date : 06/03/23 General: Alert 89 year old male in no acute distress. Eyes: Right eye deformity secondary to injury at the age of 89. Left eye without scleral icterus.  Heart: Regular rhythm, no murmurs.  Pulm: Breath sounds clear, on room air.  Abdomen: Soft, nondistended. Nontender. Positive bowel sounds x 4 quads.  Extremities: No edema. Large areas of ecchymosis to the right and left upper extremities R L L.  Neurologic:  Alert and  oriented x 4. Speech is clear. Moves all extremities equally.  Psych:  Alert and cooperative. Normal mood and affect.  Intake/Output from previous day: 04/03 0701 - 04/04 0700 In: 580 [P.O.:580] Out: -  Intake/Output this shift: No intake/output data recorded.  Lab Results: Recent Labs    06/02/23 0445 06/03/23 0104 06/04/23 0348  WBC 13.8* 18.3* 11.6*  HGB 10.3* 9.8* 8.0*  HCT 31.5* 31.0* 26.1*  PLT 132* 171 141*   BMET Recent Labs    06/02/23 0445 06/03/23 0104 06/04/23 0348  NA 133* 130* 133*  K 3.1* 4.2 3.9  CL 100 101 103  CO2 24 21* 20*  GLUCOSE 141* 135* 105*  BUN 50* 88* 87*  CREATININE 1.44* 1.41* 1.20  CALCIUM 8.7* 8.6* 9.1   LFT No results for input(s): "PROT", "ALBUMIN", "AST", "ALT", "ALKPHOS", "BILITOT", "BILIDIR", "IBILI" in the last 72 hours. PT/INR No results for input(s): "LABPROT", "INR" in the last 72 hours. Hepatitis Panel No results for input(s): "HEPBSAG", "HCVAB", "HEPAIGM", "HEPBIGM" in the last 72  hours.  No results found.  Patient Profile: [name] is a 89 y.o. male with a past medical history of arthritis, hypertension, hyperlipidemia, coronary artery disease s/p DES 12/2010, carotid stenosis, gout, glaucoma, subdural hematoma, GERD, diverticulosis, lower GI bleed when on Plavix and ASA (likely hemorrhoidal) 10/2011 and colon polyps. Admitted 05/31/2023 after falling at home, diagnosed with afib and influenza A. Initially started on IV heparin which was dc'd secondary to CGE and melena.   Assessment / Plan:  89 year old male admitted 05/31/2023 secondary to fall at home diagnosed with acute bronchitis with influenza A. Stable respiratory status, on room air. WBC 18.3 -> 11.6. Afebrile.  -Management per the medical service   UGI bleed, coffee-ground emesis/melena x 1 on 4/2 while on Heparin IV without further recurrence. Heparin on hold. -NPO  -EGD today with Dr. Marina Goodell benefits and risks discussed including risk with sedation, risk of bleeding, perforation and infection  - I contacted the patient's son, Mardelle Matte, and dicussed plan for EGD today  -Continue Pantoprazole IV twice daily -Continue to hold Heparin  Acute anemia, secondary to UGI bleed. Admission hemoglobin 11.5 -> 10.3 -> 9.8 -> today Hg 8.0.  Vitamin B12 level 626.  Folate > 40. -See plan above -Transfuse for hemoglobin less than 8 -CBC and iron panel in a.m.   GERD. Chronic NSAID use. On Famotidine 20mg  every day at home.  -See plan above    AKI, Cr 1.32 ->  1.41.   Hyponatremia. Na+ 130.   Mildly elevated AST level 43 -> 66. -Hepatic panel in a.m.  Thrombocytopenia, PLT 171 -> 141   Atrial fibrillation, controlled rate, new onset. CHADS2 score of 3. Heparin discontinued secondary to CGE/melena.    History of CAD s/p stent placement.  On ASA 81 mg daily. LV EF 70 - 75% per ECHO 06/01/2023.   Hypertensive urgency   Low TSH, no prior history of hyperthyroidism    Principal Problem:   Atrial fibrillation  (HCC) Active Problems:   CAD S/P percutaneous coronary angioplasty   Carotid stenosis   HTN (hypertension)   Dyslipidemia   Fall at home, initial encounter   Elevated troponin   Influenza A   Acute bronchitis   Thrombocytopenia (HCC)   Low TSH level   Hematemesis with nausea   Melena   Acute blood loss anemia     LOS: 1 day   Colleen M Kennedy-Smith  06/04/2023, 10:03 AM  GI ATTENDING  Agree with interval progress note. No further bleeding. Hg drifted. Na+ better. Bun the same. For EGD today. High risk. The nature of the procedure, as well as the risks, benefits, and alternatives were carefully and thoroughly reviewed with the patient. Ample time for discussion and questions allowed. The patient understood, was satisfied, and agreed to proceed.  Wilhemina Bonito. Eda Keys., M.D. [facility] Gastroenterology

## 2023-06-04 NOTE — Op Note (Signed)
 [facility] Patient Name: [name] Procedure Date: 06/04/2023 MRN: [id number] Attending MD: Wilhemina Bonito. Marina Goodell , MD, [id number] Date of Birth: Dec 03, 1930 CSN: [id number] Age: 89 Admit Type: Inpatient Procedure:                Upper GI endoscopy with biopsies Indications:              Acute post hemorrhagic anemia, Melena Providers:                Wilhemina Bonito. Marina Goodell, MD, Stephens Shire RN, RN, Kandice Robinsons, Technician Referring MD:             Tried hospitalist Medicines:                Monitored Anesthesia Care Complications:            No immediate complications. Estimated Blood Loss:     Estimated blood loss: none. Procedure:                Pre-Anesthesia Assessment:                           - Prior to the procedure, a History and Physical                            was performed, and patient medications and                            allergies were reviewed. The patient's tolerance of                            previous anesthesia was also reviewed. The risks                            and benefits of the procedure and the sedation                            options and risks were discussed with the patient.                            All questions were answered, and informed consent                            was obtained. Prior Anticoagulants: The patient has                            taken heparin, last dose was 2 days prior to                            procedure. ASA Grade Assessment: III - A patient                            with severe systemic disease. After reviewing the  risks and benefits, the patient was deemed in                            satisfactory condition to undergo the procedure.                           After obtaining informed consent, the endoscope was                            passed under direct vision. Throughout the                            procedure, the patient's blood pressure, pulse,  and                            oxygen saturations were monitored continuously. The                            [id number]-[id number] ([id number]) Olympus endoscope was introduced                            through the mouth, and advanced to the second part                            of duodenum. The upper GI endoscopy was                            accomplished without difficulty. The patient                            tolerated the procedure well. Scope In: Scope Out: Findings:      The esophagus was slightly tortuous and revealed a ringlike stricture at       the gastroesophageal junction. No active inflammation.      The stomach revealed a large hiatal hernia. No associated erosions.       Stomach was otherwise unremarkable.. Biopsies were taken with a cold       forceps for histology from the gastric antrum to rule out H. pylori.      Multiple superficial clean-based duodenal ulcers with no stigmata of       bleeding were found in the duodenal bulb and D2 junction. See images. No       stigmata or bleeding.      The examined duodenum was otherwise unremarkable.      The cardia and gastric fundus were normal on retroflexion, save large       femoral hernia. Impression:               1. Multiple superficial duodenal ulcers without                            stigmata. Likely secondary to NSAIDs. Gastric                            biopsies taken to rule out H. pylori.  2. Large hiatal hernia without erosions                           3. Incidental esophageal stricture (benign). Moderate Sedation:      none Recommendation:           1. Diet of choice                           2. Pantoprazole 40 mg p.o. twice daily for 8 weeks                            then 40 mg p.o. daily indefinitely                           3. Avoid NSAIDs                           4. Follow-up gastric biopsies (GI will)                           5. Okay to resume anticoagulation in 48 hours                            Reviewed with patient. He was given a copy of this                            report. I also called and spoke with his son Mardelle Matte                            [phone]. GI will sign off, but we are                            available for questions or recurrent problems.                            Thanks. Procedure Code(s):        --- Professional ---                           [phone], Esophagogastroduodenoscopy, flexible,                            transoral; with biopsy, single or multiple Diagnosis Code(s):        --- Professional ---                           K26.9, Duodenal ulcer, unspecified as acute or                            chronic, without hemorrhage or perforation                           D62, Acute posthemorrhagic anemia  K92.1, Melena (includes Hematochezia) CPT copyright 2022 American Medical Association. All rights reserved. The codes documented in this report are preliminary and upon coder review may  be revised to meet current compliance requirements. Wilhemina Bonito. Marina Goodell, MD 06/04/2023 3:11:06 PM This report has been signed electronically. Number of Addenda: 0

## 2023-06-04 NOTE — Anesthesia Preprocedure Evaluation (Addendum)
 Anesthesia Evaluation  Patient identified by MRN, date of birth, ID band Patient awake    Reviewed: Allergy & Precautions, NPO status , Patient's Chart, lab work & pertinent test results  Airway Mallampati: III  TM Distance: >3 FB Neck ROM: Full    Dental  (+) Dental Advisory Given, Chipped, Poor Dentition   Pulmonary pneumonia, former smoker   Pulmonary exam normal breath sounds clear to auscultation       Cardiovascular hypertension, Pt. on home beta blockers and Pt. on medications + angina  + CAD  Normal cardiovascular exam Rhythm:Regular Rate:Normal  Echo 06/2023  1. There is increased flow velocity across the LV outflow tract, due to hyperdynamic left ventricular contractility. There does not appear to be true dynamic LV outflow obstruction. Left ventricular ejection fraction, by estimation, is 70 to 75%. The left ventricle has hyperdynamic function. The left ventricle has no regional wall motion abnormalities. Left ventricular diastolic function could not be evaluated.   2. Right ventricular systolic function is normal. The right ventricular size is normal. Tricuspid regurgitation signal is inadequate for assessing PA pressure.   3. Left atrial size was mild to moderately dilated.   4. The mitral valve is normal in structure. No evidence of mitral valve regurgitation.   5. The aortic valve is normal in structure. There is mild calcification of the aortic valve. Aortic valve regurgitation is mild. Aortic valve sclerosis is present, with no evidence of aortic valve stenosis.      Neuro/Psych negative neurological ROS     GI/Hepatic Neg liver ROS,GERD  ,,  Endo/Other  negative endocrine ROS    Renal/GU Renal disease     Musculoskeletal  (+) Arthritis ,    Abdominal   Peds  Hematology  (+) Blood dyscrasia, anemia   Anesthesia Other Findings   Reproductive/Obstetrics                               Anesthesia Physical Anesthesia Plan  ASA: 3  Anesthesia Plan: MAC   Post-op Pain Management: Minimal or no pain anticipated   Induction:   PONV Risk Score and Plan: 1 and Propofol infusion, TIVA and Treatment may vary due to age or medical condition  Airway Management Planned: Natural Airway  Additional Equipment:   Intra-op Plan:   Post-operative Plan:   Informed Consent: I have reviewed the patients History and Physical, chart, labs and discussed the procedure including the risks, benefits and alternatives for the proposed anesthesia with the patient or authorized representative who has indicated his/her understanding and acceptance.     Dental advisory given  Plan Discussed with: CRNA  Anesthesia Plan Comments:         Anesthesia Quick Evaluation

## 2023-06-04 NOTE — Progress Notes (Signed)
 Occupational Therapy Treatment Patient Details Name: [name] MRN: [id number] DOB: May 31, 1930 Today's Date: 06/04/2023   History of present illness 89 yr old male admitted with a fib, falls, and flu. Hx of CAD, BPH, gout, TURP   OT comments  The pt was seen for functional strengthening and progression of functional activity. Pt required min assist to transfer into sitting EOB. Functional transfers/sit to stand was implemented using a RW. He required constant hands-on support for standing activity, given intermittent unsteadiness and decreased strength. He was able to take lateral steps along the EOB with assist. He demonstrated self-feeding with set-up assist. Continue OT plan of care. Patient will benefit from continued inpatient follow up therapy, <3 hours/day.       If plan is discharge home, recommend the following:  Assist for transportation;Assistance with cooking/housework;Help with stairs or ramp for entrance;A lot of help with bathing/dressing/bathroom;A little help with walking and/or transfers   Equipment Recommendations  Other (comment) (defer to next level of care)    Recommendations for Other Services      Precautions / Restrictions Precautions Precautions: Fall Restrictions Weight Bearing Restrictions Per Provider Order: No Other Position/Activity Restrictions: droplet precautions       Mobility Bed Mobility Overal bed mobility: Needs Assistance Bed Mobility: Supine to Sit, Sit to Supine     Supine to sit: HOB elevated, Used rails, Min assist Sit to supine: Supervision        Transfers Overall transfer level: Needs assistance Equipment used: Rolling walker (2 wheels) Transfers: Sit to/from Stand Sit to Stand: Contact guard assist           General transfer comment: Pt was instructed on 2 sit to stand transfers using a RW, for which he required CGA-min assist. He demonstrated marching in place as well as taking lateral steps along the EOB using RW.      Balance       Sitting balance - Comments: static sitting-good. dynamic sitting-fair+     Standing balance-Leahy Scale: Poor              ADL either performed or assessed with clinical judgement   ADL Overall ADL's : Needs assistance/impaired Eating/Feeding: Set up;Sitting   Grooming: Sitting;Set up Grooming Details (indicate cue type and reason): He was observed drinking water from a cup.             Lower Body Dressing: Maximal assistance Lower Body Dressing Details (indicate cue type and reason): for sock management seated EOB                   Communication Communication Communication: No apparent difficulties   Cognition Arousal: Alert Behavior During Therapy: WFL for tasks assessed/performed            Following commands: Intact                      Pertinent Vitals/ Pain       Pain Assessment Pain Assessment: No/denies pain         Frequency  Min 2X/week        Progress Toward Goals  OT Goals(current goals can now be found in the care plan section)  Progress towards OT goals: Progressing toward goals  Acute Rehab OT Goals Patient Stated Goal: to get better OT Goal Formulation: With patient Time For Goal Achievement: 06/16/23 Potential to Achieve Goals: Good  Plan         AM-PAC OT "6 Clicks" Daily Activity  Outcome Measure   Help from another person eating meals?: None Help from another person taking care of personal grooming?: A Little Help from another person toileting, which includes using toliet, bedpan, or urinal?: A Lot Help from another person bathing (including washing, rinsing, drying)?: A Lot Help from another person to put on and taking off regular upper body clothing?: A Little Help from another person to put on and taking off regular lower body clothing?: A Lot 6 Click Score: 16    End of Session Equipment Utilized During Treatment: Rolling walker (2 wheels)  OT Visit Diagnosis: Unsteadiness on  feet (R26.81);Muscle weakness (generalized) (M62.81);History of falling (Z91.81)   Activity Tolerance Other (comment) (Fair+ tolerance)   Patient Left in bed;with call bell/phone within reach;with bed alarm set   Nurse Communication Mobility status        Time: 6440-3474 OT Time Calculation (min): 17 min  Charges: OT General Charges $OT Visit: 1 Visit OT Treatments $Therapeutic Activity: 8-22 mins    Reuben Likes, OTR/L 06/04/2023, 5:22 PM

## 2023-06-04 NOTE — Progress Notes (Addendum)
   Received a secure chat that patient had a 4 second pause on telemetry and rates were in the 40s. I am currently at [facility] and am not able to review full telemetry at [facility]. However, I reviewed save telemetry strip which does show atrial fibrillation with a 4.35 second pause. EKG shows slow atrial fibrillation with ventricular rate of 38 bpm. BP is stable. RN said patient was awake during this but is completely asymptomatic. He is on Lopressor 37.5mg  twice daily. It looks like both doses were held yesterday because "parameters were not met" (looks like rates were in the 50s to low 60s). However, he did get a dose at this afternoon at 1604. Will stop Lopressor. No urgent intervention needed tonight as long as BP remains stable and he is asymptomatic. If he remains bradycardic and continues to have significant pauses after beta-blocker washout, may need to consult EP for consideration of pacemaker. Will have our team re-round on him tomorrow. Discussed this plan with DOD (Dr. Cristal Deer) who agreed.   Corrin Parker, PA-C 06/04/2023 6:35 PM

## 2023-06-04 NOTE — Progress Notes (Signed)
 Pt  had a 4 sec pause, per tele, and heart rate dropped to 38 after taking daily metoprolol dose. STAT EKG performed. BP  114/46 (67). Pt awake and alert, denies any symptoms at present. Hospitalist notified. Order to transfer to step down unit for closer monitoring. POA updated.

## 2023-06-05 DIAGNOSIS — I4891 Unspecified atrial fibrillation: Secondary | ICD-10-CM | POA: Diagnosis not present

## 2023-06-05 DIAGNOSIS — I4819 Other persistent atrial fibrillation: Secondary | ICD-10-CM | POA: Diagnosis not present

## 2023-06-05 LAB — BASIC METABOLIC PANEL WITH GFR
Anion gap: 7 (ref 5–15)
BUN: 73 mg/dL — ABNORMAL HIGH (ref 8–23)
CO2: 22 mmol/L (ref 22–32)
Calcium: 8.9 mg/dL (ref 8.9–10.3)
Chloride: 107 mmol/L (ref 98–111)
Creatinine, Ser: 1.41 mg/dL — ABNORMAL HIGH (ref 0.61–1.24)
GFR, Estimated: 47 mL/min — ABNORMAL LOW (ref >60)
Glucose, Bld: 130 mg/dL — ABNORMAL HIGH (ref 70–99)
Potassium: 3.8 mmol/L (ref 3.5–5.1)
Sodium: 136 mmol/L (ref 135–145)

## 2023-06-05 LAB — PREPARE RBC (CROSSMATCH)

## 2023-06-05 LAB — CULTURE, BLOOD (ROUTINE X 2)
Culture: NO GROWTH
Culture: NO GROWTH

## 2023-06-05 LAB — BPAM RBC
Blood Product Expiration Date: 202505082359
Unit Type and Rh: 9500

## 2023-06-05 LAB — URINALYSIS, ROUTINE W REFLEX MICROSCOPIC
Bilirubin Urine: NEGATIVE
Glucose, UA: NEGATIVE mg/dL
Hgb urine dipstick: NEGATIVE
Ketones, ur: NEGATIVE mg/dL
Leukocytes,Ua: NEGATIVE
Nitrite: NEGATIVE
Protein, ur: 100 mg/dL — AB
Specific Gravity, Urine: 1.015 (ref 1.005–1.030)
pH: 5 (ref 5.0–8.0)

## 2023-06-05 LAB — TYPE AND SCREEN
ABO/RH(D): O NEG
Antibody Screen: NEGATIVE
Unit division: 0

## 2023-06-05 LAB — CBC
HCT: 23.7 % — ABNORMAL LOW (ref 39.0–52.0)
Hemoglobin: 7.5 g/dL — ABNORMAL LOW (ref 13.0–17.0)
MCH: 31.8 pg (ref 26.0–34.0)
MCHC: 31.6 g/dL (ref 30.0–36.0)
MCV: 100.4 fL — ABNORMAL HIGH (ref 80.0–100.0)
Platelets: 145 10*3/uL — ABNORMAL LOW (ref 150–400)
RBC: 2.36 MIL/uL — ABNORMAL LOW (ref 4.22–5.81)
RDW: 14.7 % (ref 11.5–15.5)
WBC: 10.9 10*3/uL — ABNORMAL HIGH (ref 4.0–10.5)
nRBC: 0.2 % (ref 0.0–0.2)

## 2023-06-05 MED ORDER — DEXTROMETHORPHAN POLISTIREX ER 30 MG/5ML PO SUER
30.0000 mg | Freq: Three times a day (TID) | ORAL | Status: DC | PRN
  Administered 2023-06-06 – 2023-06-07 (×5): 30 mg via ORAL
  Filled 2023-06-05 (×8): qty 5

## 2023-06-05 MED ORDER — SODIUM CHLORIDE 0.9% IV SOLUTION: Freq: Once | INTRAVENOUS | Status: AC

## 2023-06-05 MED ORDER — MORPHINE SULFATE (PF) 2 MG/ML IV SOLN
2.0000 mg | Freq: Once | INTRAVENOUS | Status: AC
  Administered 2023-06-05: 2 mg via INTRAVENOUS
  Filled 2023-06-05: qty 1

## 2023-06-05 NOTE — Progress Notes (Signed)
   Patient Name: [name] Date of Encounter: 06/05/2023 [facility] Cardiologist: Rollene Rotunda, MD   Interval Summary  .    In chair, calmer.   Vital Signs .    Vitals:   06/05/23 0606 06/05/23 0800 06/05/23 0836 06/05/23 0900  BP: (!) 182/82 (!) 178/152  (!) 159/104  Pulse: 78 84  77  Resp: (!) 31 20  18   Temp:   97.6 F (36.4 C)   TempSrc:   Oral   SpO2: 99% 99%  99%  Weight:      Height:        Intake/Output Summary (Last 24 hours) at 06/05/2023 1115 Last data filed at 06/05/2023 0900 Gross per 24 hour  Intake 936.93 ml  Output --  Net 936.93 ml      06/04/2023    6:41 PM 06/01/2023    5:52 AM 04/28/2018    3:15 PM  Last 3 Weights  Weight (lbs) 175 lb 0.7 oz 173 lb 1 oz 185 lb 9.6 oz  Weight (kg) 79.4 kg 78.5 kg 84.188 kg      Telemetry/ECG    AFIB 70's, no further brady/pauses of metoprolol - Personally Reviewed  Physical Exam .   GEN: No acute distress.  Confused in chair  Neck: No JVD Cardiac: Irreg irreg, no murmurs, rubs, or gallops.  Respiratory: Clear to auscultation bilaterally. GI: Soft, nontender, non-distended  MS: No edema  Assessment & Plan .     89 year old with agitation, atrial fibrillation CHADSVASc or 3 with coronary disease hypertensive urgency  A-fib persistent, 4-second pause. - In the setting of Flu A asymptomatic ventricular rates have been controlled.  4-second pause was noted yesterday/ brady in the upper 30's.  Beta-blocker has been held.  Was on metoprolol 37.5 twice daily. Now in the 70's -Would hold off on pacemaker implantation.  CAD with CTO of RCA OM DES in 2012 - Troponin mildly elevated at approximately 120.  This is myocardial injury in the setting of underlying illness.  Demand ischemia type process.  Flu A.  No anginal symptoms.  Conservative management. - Continue with aspirin 81 mg lisinopril 40 mg    For questions or updates, please contact [facility] Please consult [email or web address] for contact  info under        Signed, Donato Schultz, MD

## 2023-06-05 NOTE — Progress Notes (Signed)
 Upon entering patient's room, patient was able to pull out his IV. Restraints reapplied, new IV started. Patient extremely agitated.

## 2023-06-05 NOTE — Progress Notes (Addendum)
 PROGRESS NOTE [name]  ZOX:[id number] DOB: 22-Jul-1930 DOA: 05/31/2023 PCP: Gaspar Garbe, MD  Brief Narrative/Hospital Course: 89 year old man presented after a fall at home.  PMH CAD, PCI.  Evaluation revealed influenza A, atrial fibrillation rate controlled.  Patient admitted for flu and new A-fib.  Consultants Cardiology  Gastroenterology  Procedures/Events EGD 4/4> multiple superficial duodenal ulcers without stigmata likely secondary to NSAIDs, gastric biopsies taken to rule out H. pylori 200 e. incidentally cbd stricture.>GI advised PPI 40 twice daily x 8 weeks then daily avoid NSAIDs follow-up biopsy result and okay to resume anticoagulation in 48 hours.  Subjective: Patient seen examined this morning Patient had 4.3-second pause and heart rate in 30s to 40s yesterday and was seen at bedside and he was asymptomatic- cardiology was able informed, Metropol discontinued, transferred to stepdown for monitoring Overnight patient had been agitated removing lines Hemodynamically stable heart rate in 70s BP 150s-180s tachypneic He is alert awake oriented to self-communicative but confused Denies any complaint Needing restraint Labs with creatinine down to 1.4 from 1.7. Hemoglobin trending down 7.5g.  Assessment and plan:  Acute bronchitis with influenza A infection: Respiratory status stable.complete Tamiflu course and taper of prednisone   New onset A-fib  Elevated troponin-from demand ischemia 4.3-second pause/A-fib with slow ventricular response rate with heart rate in 30s on 06/04/23 PM: PTA on metoprolol 25 twice daily.Echo reassuring.  Seen by Cardiology> metoprolol dose increased-but subsequently discontinued 4/4 evening due to pauses and bradycardia-  was seen at bedside and he was asymptomatic- cardiology was able informed, Metropol discontinued, transferred to stepdown for monitoring TSH depressed but normal FT4-will need follow-up in 3 to 4 weeks. Not a ideal  candidate for anticoagulation for long-term. Await cardiology recommendation.  Acute metabolic /toxic encephalopathy: Patient agitated and confused overnight multifactorial in the setting of advanced age, complex co-morbidities,?from anesthesia from endoscopy. At baseline he is aaox3 and no memory issues-sometime forgets day of the week. Continue  on delirium precaution fall precautions. UA done no UTI  Coffee-ground emesis ABLA Multiple superficial duodenal ulcers Leukocytosis-resolving: No bowel movement  now. GI following closely heparin has been discontinued 4/3> underwent EGD with finding as above with multiple superficial duodenal ulcers hiatal hernia.  Cannot do PPI twice daily x 8 weeks then daily.  Per GI okay to resume anticoagulation if needed further cardiology.  Trend hemoglobin, now less than 8- we will transfuse 1 unit PRBC to keep >8 discussed with POA Mardelle Matte and explained risk, benefits alternatives and he agreeable with transfusion Recent Labs  Lab 06/04/23 0348 06/04/23 1155 06/04/23 1412 06/04/23 1923 06/05/23 0246  HGB 8.0* 8.8* 9.5* 7.7* 7.5*  HCT 26.1* 28.8* 28.0* 24.9* 23.7*    Macrocytic anemia  Thrombocytopenia: B12 folate level stable.Monitor CBC-platelet trending up.   Low TSH: Normal free T4,monitor as outpatient  Hypokalemia: Resolved.  Hypertensive urgency:  BP trending up again.Cont lisinopril, will consider hydralazine,discontinue metoprolol due to bradycardia and pauses   Fall at home PMH  of SDH: Continue PT OT and planning for SNF at discharge   AKI on CKD3a vs ckd 3b Elevated creatinine on admission 1.32: last values from 2014 likely has baseline CKD.Level fluctuating from 1.2-1.4 suspect CKD stage IIIa  vs 3b at - peaked to 1.7 indicating some component of AKI. Given gentle IV fluid hydration overnight. Renal function stable. Recent Labs    05/31/23 1410 06/01/23 0409 06/02/23 0445 06/03/23 0104 06/04/23 0348 06/04/23 1412  06/05/23 0246  BUN 29* 26* 50* 88* 87* 86* 73*  CREATININE 1.32* 1.23 1.44* 1.41* 1.20 1.70* 1.41*  CO2 25 22 24  21* 20*  --  22  K 3.7 3.5 3.1* 4.2 3.9 3.7 3.8    Hyperlipidemia: Continue statin  DVT prophylaxis:  Code Status:   Code Status: Full Code Family Communication: plan of care discussed with patient at bedside.  Updated patient's daughter and son [name] Patient status is: Remains hospitalized because of severity of illness Level of care: Stepdown   Dispo: The patient is from: home            Anticipated disposition: SNF once ok w/ cardio 1-2 days  Objective: Vitals last 24 hrs: Vitals:   06/05/23 0900 06/05/23 1341 06/05/23 1400 06/05/23 1521  BP: (!) 159/104 (!) 127/52 (!) 130/48   Pulse: 77 60 68 67  Resp: 18 (!) 21 17 (!) 22  Temp:  (!) 97.5 F (36.4 C) 97.6 F (36.4 C)   TempSrc:  Oral Axillary   SpO2: 99% 100% 100% 100%  Weight:      Height:       Weight change:   Physical Examination: General exam: alert awake, oriented x1-2 HEENT:Oral mucosa moist, Ear/Nose WNL grossly Respiratory system: Bilaterally clear BS,no use of accessory muscle Cardiovascular system: S1 & S2 +, No JVD. Gastrointestinal system: Abdomen soft,NT,ND, BS+ Nervous System: Alert, awake, moving all extremities well and non focal ,and following commands. Extremities: LE edema neg,distal peripheral pulses palpable and warm.  Skin: No rashes,no icterus. MSK: Normal muscle bulk,tone, power   Medications reviewed:  Scheduled Meds:  aspirin EC  81 mg Oral Daily   atorvastatin  40 mg Oral Daily   Chlorhexidine Gluconate Cloth  6 each Topical Daily   famotidine  20 mg Oral QHS   latanoprost  1 drop Left Eye QHS   lisinopril  40 mg Oral Daily   oseltamivir  30 mg Oral BID   pantoprazole (PROTONIX) IV  40 mg Intravenous Q12H   predniSONE  20 mg Oral Q breakfast   timolol  1 drop Left Eye BID  Continuous Infusions:  lactated ringers Stopped (06/05/23 1336)    Diet Order              Diet regular Room service appropriate? Yes; Fluid consistency: Thin  Diet effective now                  Intake/Output Summary (Last 24 hours) at 06/05/2023 1532 Last data filed at 06/05/2023 1500 Gross per 24 hour  Intake 1347.75 ml  Output --  Net 1347.75 ml   Net IO Since Admission: 3,076.22 mL [06/05/23 1532]  Wt Readings from Last 3 Encounters:  06/04/23 79.4 kg  04/28/18 84.2 kg  04/13/17 81.6 kg     Unresulted Labs (From admission, onward)     Start     Ordered   06/04/23 0500  Basic metabolic panel with GFR  Daily,   R      06/03/23 1118   06/03/23 0500  CBC  Daily,   R      06/02/23 1405          Data Reviewed: I have personally reviewed following labs and imaging studies ( see epic result tab) CBC: Recent Labs  Lab 05/31/23 1410 06/01/23 0409 06/02/23 0445 06/03/23 0104 06/04/23 0348 06/04/23 1155 06/04/23 1412 06/04/23 1923 06/05/23 0246  WBC 7.3 8.8 13.8* 18.3* 11.6*  --   --   --  10.9*  NEUTROABS 5.5  --   --   --   --   --   --   --   --  HGB 11.5* 11.5* 10.3* 9.8* 8.0* 8.8* 9.5* 7.7* 7.5*  HCT 36.0* 37.2* 31.5* 31.0* 26.1* 28.8* 28.0* 24.9* 23.7*  MCV 98.6 103.3* 96.6 101.0* 102.4*  --   --   --  100.4*  PLT 138* 118* 132* 171 141*  --   --   --  145*   CMP: Recent Labs  Lab 05/31/23 1410 06/01/23 0409 06/02/23 0445 06/03/23 0104 06/04/23 0348 06/04/23 1412 06/05/23 0246  NA 137 134* 133* 130* 133* 137 136  K 3.7 3.5 3.1* 4.2 3.9 3.7 3.8  CL 102 102 100 101 103 103 107  CO2 25 22 24  21* 20*  --  22  GLUCOSE 127* 119* 141* 135* 105* 84 130*  BUN 29* 26* 50* 88* 87* 86* 73*  CREATININE 1.32* 1.23 1.44* 1.41* 1.20 1.70* 1.41*  CALCIUM 9.3 9.0 8.7* 8.6* 9.1  --  8.9  MG 1.7  --   --   --   --   --   --    GFR: Estimated Creatinine Clearance: 33 mL/min (A) (by C-G formula based on SCr of 1.41 mg/dL (H)). Recent Labs  Lab 05/31/23 1410 06/01/23 0409  AST 43* 66*  ALT 16 19  ALKPHOS 62 55  BILITOT 0.8 1.1  PROT 6.2* 5.8*   ALBUMIN 3.5 3.2*   Antimicrobials/Microbiology: Anti-infectives (From admission, onward)    Start     Dose/Rate Route Frequency Ordered Stop   06/01/23 2200  oseltamivir (TAMIFLU) capsule 30 mg        30 mg Oral 2 times daily 06/01/23 0502 06/06/23 0959   06/01/23 0515  oseltamivir (TAMIFLU) capsule 75 mg        75 mg Oral  Once 06/01/23 0500 06/01/23 0603         Component Value Date/Time   SDES  05/31/2023 1412    BLOOD LEFT ANTECUBITAL Performed at [facility], [address] [place], [place] [address]    SPECREQUEST  05/31/2023 1412    BOTTLES DRAWN AEROBIC ONLY Blood Culture results may not be optimal due to an inadequate volume of blood received in culture bottles Performed at [facility], [address] [place], [place] [address]    CULT  05/31/2023 1412    NO GROWTH 5 DAYS Performed at [facility], [address], [place], [place] [address]    REPTSTATUS 06/05/2023 FINAL 05/31/2023 1412     Radiology Studies: No results found.   LOS: 2 days   Total time spent in review of labs and imaging, patient evaluation, formulation of plan, documentation and communication with patient/family: 50 minutes  Lanae Boast, MD [facility] 06/05/2023, 3:32 PM

## 2023-06-05 NOTE — Plan of Care (Signed)
 Patients mentation was much better.  Up to chair and eating well today.  Gave one unit of blood.

## 2023-06-06 ENCOUNTER — Encounter (HOSPITAL_COMMUNITY): Payer: Self-pay | Admitting: Internal Medicine

## 2023-06-06 DIAGNOSIS — I4819 Other persistent atrial fibrillation: Secondary | ICD-10-CM | POA: Diagnosis not present

## 2023-06-06 LAB — CBC
HCT: 25 % — ABNORMAL LOW (ref 39.0–52.0)
Hemoglobin: 8 g/dL — ABNORMAL LOW (ref 13.0–17.0)
MCH: 31.3 pg (ref 26.0–34.0)
MCHC: 32 g/dL (ref 30.0–36.0)
MCV: 97.7 fL (ref 80.0–100.0)
Platelets: 143 10*3/uL — ABNORMAL LOW (ref 150–400)
RBC: 2.56 MIL/uL — ABNORMAL LOW (ref 4.22–5.81)
RDW: 14.8 % (ref 11.5–15.5)
WBC: 10.8 10*3/uL — ABNORMAL HIGH (ref 4.0–10.5)
nRBC: 0.2 % (ref 0.0–0.2)

## 2023-06-06 LAB — BASIC METABOLIC PANEL WITH GFR
Anion gap: 6 (ref 5–15)
BUN: 60 mg/dL — ABNORMAL HIGH (ref 8–23)
CO2: 24 mmol/L (ref 22–32)
Calcium: 8.5 mg/dL — ABNORMAL LOW (ref 8.9–10.3)
Chloride: 111 mmol/L (ref 98–111)
Creatinine, Ser: 1.36 mg/dL — ABNORMAL HIGH (ref 0.61–1.24)
GFR, Estimated: 49 mL/min — ABNORMAL LOW (ref >60)
Glucose, Bld: 132 mg/dL — ABNORMAL HIGH (ref 70–99)
Potassium: 3.5 mmol/L (ref 3.5–5.1)
Sodium: 141 mmol/L (ref 135–145)

## 2023-06-06 NOTE — Progress Notes (Signed)
 Off of metoprolol 37.5 twice a day.  Heart rate is maintaining in the 70s.  Atrial fibrillation is persistent.  Prior 4-second pause.  Please see prior note for details.  No indication for pacemaker implantation at this time.  Please let us know if we can be of further assistance.  Donato Schultz, MD

## 2023-06-06 NOTE — Progress Notes (Signed)
 PROGRESS NOTE [name]  GNF:[id number] DOB: 02/23/1931 DOA: 05/31/2023 PCP: Kenneth Garbe, MD  Brief Narrative/Hospital Course: 89 year old man presented after a fall at home.  PMH CAD, PCI.  Evaluation revealed influenza A, atrial fibrillation rate controlled.  Patient admitted for flu and new A-fib.  Consultants Cardiology  Gastroenterology  Procedures/Events EGD 4/4> multiple superficial duodenal ulcers without stigmata likely secondary to NSAIDs, gastric biopsies taken to rule out H. pylori 200 e. incidentally cbd stricture.>GI advised PPI 40 twice daily x 8 weeks then daily avoid NSAIDs follow-up biopsy result and okay to resume anticoagulation in 48 hours.  Subjective: Seen and examined this morning He is alert awake oriented to place people president conversant much improved mentation Heart rate maintaining in 70s, no more bradycardia or pauses and has A-fib  Labs improved hemoglobin creatinine continues to downtrend slowly  Assessment and plan:  Acute bronchitis with influenza A infection: Respiratory status stable.complete Tamiflu course and taper of prednisone   New onset A-fib  Elevated troponin-from demand ischemia 4.3-second pause/A-fib with slow ventricular response rate with heart rate in 30s on 06/04/23 PM: PTA on metoprolol 25 twice daily.Echo reassuring.  Seen by Cardiology> metoprolol increased-but subsequently discontinued 4/4 evening due to pauses and bradycardia-heart rate now maintained over after beta-blocker washout.  Seen by cardiology no further recommendation.  TSH depressed but normal FT4-will need follow-up in 3 to 4 weeks. Not a ideal candidate for anticoagulation for long-term per cardiology due to fall risk.  Acute metabolic /toxic encephalopathy: Patient agitated and confused 4/4 night - multifactorial in the setting of advanced age, complex co-morbidities,?from anesthesia from endoscopy. At baseline he is aaox3 and no memory issues-sometime  forgets day of the week. Today mentation much improved pleasant conversant Continue  on delirium precaution fall precautions. UA done no UTI  Coffee-ground emesis ABLA Multiple superficial duodenal ulcers Leukocytosis 18k-resolving: GI following closely heparin has been discontinued 4/3> underwent EGD with finding as above with multiple superficial duodenal ulcers hiatal hernia.  Cannot do PPI twice daily x 8 weeks then daily. S/ p 1 unit PRBC and hb up in 8 Recent Labs  Lab 06/04/23 1155 06/04/23 1412 06/04/23 1923 06/05/23 0246 06/06/23 0244  HGB 8.8* 9.5* 7.7* 7.5* 8.0*  HCT 28.8* 28.0* 24.9* 23.7* 25.0*    Macrocytic anemia  Thrombocytopenia: B12 folate level stable.Monitor CBC-platelet trending up.   Low TSH: Normal free T4,monitor as outpatient  Hypokalemia: Resolved.  Hypertensive urgency:  BP controlled this morning.Cont lisinopril, will consider hydralazine,discontinued metoprolol due to bradycardia and pauses   Fall at home PMH  of SDH: Continue PT OT and planning for SNF at discharge   AKI on CKD3a vs ckd 3b Elevated creatinine on admission 1.32: last values from 2014 likely has baseline CKD.Level fluctuating from 1.2-1.4 here- suspect CKD stage IIIa  vs 3b at - creat peaked to 1.7 indicating some component of AKI. Given gentle IV fluid hydration  and creat in 1.4 as below Recent Labs    05/31/23 1410 06/01/23 0409 06/02/23 0445 06/03/23 0104 06/04/23 0348 06/04/23 1412 06/05/23 0246 06/06/23 0244  BUN 29* 26* 50* 88* 87* 86* 73* 60*  CREATININE 1.32* 1.23 1.44* 1.41* 1.20 1.70* 1.41* 1.36*  CO2 25 22 24  21* 20*  --  22 24  K 3.7 3.5 3.1* 4.2 3.9 3.7 3.8 3.5    Hyperlipidemia: Continue statin  DVT prophylaxis:  Code Status:   Code Status: Full Code Family Communication: plan of care discussed with patient at bedside.  Updated patient's  daughter and son [name] Patient status is: Remains hospitalized because of severity of illness Level of care:  Stepdown   Dispo: The patient is from: home            Anticipated disposition: SNF in next 24 hours if approved  Objective: Vitals last 24 hrs: Vitals:   06/06/23 0700 06/06/23 0800 06/06/23 0847 06/06/23 0900  BP: (!) 146/45 (!) 137/46  (!) 133/48  Pulse: 69 65  66  Resp: 20 20  (!) 22  Temp:   98.6 F (37 C)   TempSrc:   Oral   SpO2: 97% 98%  98%  Weight:      Height:       Weight change:   Physical Examination: General exam: alert awake, conversant pleasant HEENT:Oral mucosa moist, Ear/Nose WNL grossly Respiratory system: Bilaterally clear BS,no use of accessory muscle Cardiovascular system: S1 & S2 +, No JVD. Gastrointestinal system: Abdomen soft,NT,ND, BS+ Nervous System: Alert, awake, moving all extremities,and following commands. Extremities: LE edema neg,distal peripheral pulses palpable and warm.  Skin: No rashes,no icterus. MSK: Normal muscle bulk,tone, power    Medications reviewed:  Scheduled Meds:  aspirin EC  81 mg Oral Daily   atorvastatin  40 mg Oral Daily   Chlorhexidine Gluconate Cloth  6 each Topical Daily   famotidine  20 mg Oral QHS   latanoprost  1 drop Left Eye QHS   lisinopril  40 mg Oral Daily   pantoprazole (PROTONIX) IV  40 mg Intravenous Q12H   predniSONE  20 mg Oral Q breakfast   timolol  1 drop Left Eye BID  Continuous Infusions:    Diet Order             Diet regular Room service appropriate? Yes; Fluid consistency: Thin  Diet effective now                  Intake/Output Summary (Last 24 hours) at 06/06/2023 1205 Last data filed at 06/06/2023 1610 Gross per 24 hour  Intake 1063.16 ml  Output 250 ml  Net 813.16 ml   Net IO Since Admission: 3,278.56 mL [06/06/23 1205]  Wt Readings from Last 3 Encounters:  06/04/23 79.4 kg  04/28/18 84.2 kg  04/13/17 81.6 kg     Unresulted Labs (From admission, onward)     Start     Ordered   06/04/23 0500  Basic metabolic panel with GFR  Daily,   R      06/03/23 1118           Data Reviewed: I have personally reviewed following labs and imaging studies ( see epic result tab) CBC: Recent Labs  Lab 05/31/23 1410 06/01/23 0409 06/02/23 0445 06/03/23 0104 06/04/23 0348 06/04/23 1155 06/04/23 1412 06/04/23 1923 06/05/23 0246 06/06/23 0244  WBC 7.3   < > 13.8* 18.3* 11.6*  --   --   --  10.9* 10.8*  NEUTROABS 5.5  --   --   --   --   --   --   --   --   --   HGB 11.5*   < > 10.3* 9.8* 8.0* 8.8* 9.5* 7.7* 7.5* 8.0*  HCT 36.0*   < > 31.5* 31.0* 26.1* 28.8* 28.0* 24.9* 23.7* 25.0*  MCV 98.6   < > 96.6 101.0* 102.4*  --   --   --  100.4* 97.7  PLT 138*   < > 132* 171 141*  --   --   --  145*  143*   < > = values in this interval not displayed.   CMP: Recent Labs  Lab 05/31/23 1410 06/01/23 0409 06/02/23 0445 06/03/23 0104 06/04/23 0348 06/04/23 1412 06/05/23 0246 06/06/23 0244  NA 137   < > 133* 130* 133* 137 136 141  K 3.7   < > 3.1* 4.2 3.9 3.7 3.8 3.5  CL 102   < > 100 101 103 103 107 111  CO2 25   < > 24 21* 20*  --  22 24  GLUCOSE 127*   < > 141* 135* 105* 84 130* 132*  BUN 29*   < > 50* 88* 87* 86* 73* 60*  CREATININE 1.32*   < > 1.44* 1.41* 1.20 1.70* 1.41* 1.36*  CALCIUM 9.3   < > 8.7* 8.6* 9.1  --  8.9 8.5*  MG 1.7  --   --   --   --   --   --   --    < > = values in this interval not displayed.   GFR: Estimated Creatinine Clearance: 34.2 mL/min (A) (by C-G formula based on SCr of 1.36 mg/dL (H)). Recent Labs  Lab 05/31/23 1410 06/01/23 0409  AST 43* 66*  ALT 16 19  ALKPHOS 62 55  BILITOT 0.8 1.1  PROT 6.2* 5.8*  ALBUMIN 3.5 3.2*   Antimicrobials/Microbiology: Anti-infectives (From admission, onward)    Start     Dose/Rate Route Frequency Ordered Stop   06/01/23 2200  oseltamivir (TAMIFLU) capsule 30 mg        30 mg Oral 2 times daily 06/01/23 0502 06/05/23 2155   06/01/23 0515  oseltamivir (TAMIFLU) capsule 75 mg        75 mg Oral  Once 06/01/23 0500 06/01/23 0603         Component Value Date/Time   SDES  05/31/2023 1412     BLOOD LEFT ANTECUBITAL Performed at [facility], [address] [place], [place] [address]    SPECREQUEST  05/31/2023 1412    BOTTLES DRAWN AEROBIC ONLY Blood Culture results may not be optimal due to an inadequate volume of blood received in culture bottles Performed at [facility], [address], [place], [place] [address]    CULT  05/31/2023 1412    NO GROWTH 5 DAYS Performed at [facility], [address] [place], [place] [address]    REPTSTATUS 06/05/2023 FINAL 05/31/2023 1412     Radiology Studies: No results found.   LOS: 3 days   Total time spent in review of labs and imaging, patient evaluation, formulation of plan, documentation and communication with patient/family: 35 minutes  Lanae Boast, MD [facility] 06/06/2023, 12:05 PM

## 2023-06-07 DIAGNOSIS — I4819 Other persistent atrial fibrillation: Secondary | ICD-10-CM | POA: Diagnosis not present

## 2023-06-07 DIAGNOSIS — I480 Paroxysmal atrial fibrillation: Secondary | ICD-10-CM

## 2023-06-07 LAB — TYPE AND SCREEN
ABO/RH(D): O NEG
Antibody Screen: NEGATIVE
Unit division: 0
Weak D: POSITIVE

## 2023-06-07 LAB — BPAM RBC
Blood Product Expiration Date: 202505082359
ISSUE DATE / TIME: 202504051326
Unit Type and Rh: 9500

## 2023-06-07 LAB — BASIC METABOLIC PANEL WITH GFR
Anion gap: 7 (ref 5–15)
BUN: 49 mg/dL — ABNORMAL HIGH (ref 8–23)
CO2: 23 mmol/L (ref 22–32)
Calcium: 8.4 mg/dL — ABNORMAL LOW (ref 8.9–10.3)
Chloride: 111 mmol/L (ref 98–111)
Creatinine, Ser: 1.18 mg/dL (ref 0.61–1.24)
GFR, Estimated: 58 mL/min — ABNORMAL LOW (ref >60)
Glucose, Bld: 115 mg/dL — ABNORMAL HIGH (ref 70–99)
Potassium: 3.4 mmol/L — ABNORMAL LOW (ref 3.5–5.1)
Sodium: 141 mmol/L (ref 135–145)

## 2023-06-07 LAB — SURGICAL PATHOLOGY

## 2023-06-07 MED ORDER — HYDRALAZINE HCL 25 MG PO TABS
25.0000 mg | ORAL_TABLET | Freq: Three times a day (TID) | ORAL | Status: DC
  Administered 2023-06-07 (×2): 25 mg via ORAL
  Filled 2023-06-07 (×2): qty 1

## 2023-06-07 MED ORDER — HYDRALAZINE HCL 50 MG PO TABS
50.0000 mg | ORAL_TABLET | Freq: Three times a day (TID) | ORAL | Status: DC
  Administered 2023-06-07: 50 mg via ORAL
  Filled 2023-06-07: qty 1

## 2023-06-07 MED ORDER — MELATONIN 5 MG PO TABS
5.0000 mg | ORAL_TABLET | Freq: Every evening | ORAL | Status: DC | PRN
  Administered 2023-06-07 (×2): 5 mg via ORAL
  Filled 2023-06-07 (×2): qty 1

## 2023-06-07 MED ORDER — PREDNISONE 10 MG PO TABS
10.0000 mg | ORAL_TABLET | Freq: Every day | ORAL | Status: DC
  Administered 2023-06-07 – 2023-06-08 (×2): 10 mg via ORAL
  Filled 2023-06-07 (×2): qty 1

## 2023-06-07 MED ORDER — PANTOPRAZOLE SODIUM 40 MG PO TBEC
40.0000 mg | DELAYED_RELEASE_TABLET | Freq: Two times a day (BID) | ORAL | Status: DC
  Administered 2023-06-07 – 2023-06-08 (×2): 40 mg via ORAL
  Filled 2023-06-07 (×2): qty 1

## 2023-06-07 NOTE — Progress Notes (Signed)
 Patient is alert, oriented to self only. He appears to be calm and cooperative. Bilateral mittens removed.

## 2023-06-07 NOTE — TOC Progression Note (Signed)
 Transition of Care [facility]) - Progression Note   Patient Details  Name: [name] MRN: [id number] Date of Birth: 11-Jan-1931  Transition of Care [facility] CM/SW Contact  Ewing Schlein, LCSW Phone Number: 06/07/2023, 9:59 AM  Clinical Narrative: Patient was in mittens this morning, which have now been removed. CSW confirmed with Tiffany from [facility] that patient will need to be out of restraints for at least 24 hours prior to admission. CSW updated treatment team. CSW called [facility] and confirmed patient's insurance authorization has a grace period for admission to SNF, but patient will need to be admitted by 06/08/23 at 11:59pm otherwise another insurance authorization will need to be completed.  Expected Discharge Plan: Skilled Nursing Facility Barriers to Discharge: Continued Medical Work up, Requiring sitter/restraints  Expected Discharge Plan and Services Living arrangements for the past 2 months: Single Family Home            DME Arranged: N/A DME Agency: NA  Social Determinants of Health (SDOH) Interventions SDOH Screenings   Food Insecurity: No Food Insecurity (06/01/2023)  Housing: Low Risk  (06/01/2023)  Transportation Needs: No Transportation Needs (06/01/2023)  Utilities: Not At Risk (06/01/2023)  Social Connections: Unknown (06/01/2023)  Tobacco Use: Medium Risk (06/04/2023)   Readmission Risk Interventions     No data to display

## 2023-06-07 NOTE — Progress Notes (Signed)
 PROGRESS NOTE [name]  ZOX:[id number] DOB: Jul 23, 1930 DOA: 05/31/2023 PCP: Gaspar Garbe, MD  Brief Narrative/Hospital Course: 89 year old man presented after a fall at home.  PMH CAD, PCI.  Evaluation revealed influenza A, atrial fibrillation rate controlled.  Patient admitted for flu and new A-fib. Continue with Tamiflu and steroid, initially received heparin drip but stopped due to coffee-ground emesis underwent endoscopy-Multiple superficial duodenal ulcers-managed with PPI. Felt to be a poor candidate for anticoagulation due to his risk of falls. Patient had 4.3-second pause, A-fib with slow ventricular response rate transferred to stepdown metoprolol discontinued following meals heart rate is stabilized.  He also had delirium in the setting of postanesthesia hospitalization-which subsequently resolved He remains weak and deconditioned and planning for skilled nursing facility.  Consultants Cardiology  Gastroenterology  Procedures/Events EGD 4/4> multiple superficial duodenal ulcers without stigmata likely secondary to NSAIDs, gastric biopsies taken to rule out H. pylori 200 e. incidentally cbd stricture.>GI advised PPI 40 twice daily x 8 weeks then daily avoid NSAIDs follow-up biopsy result and okay to resume anticoagulation in 48 hours.  Subjective: Seen and examined this morning He is alert awake resting comfortably communicative mentation better Blood pressure high improving after hydralazine Overnight BP hypertensive meds adjusted Labs with improved creatinine 1.1  Assessment and plan: Acute bronchitis with influenza A infection: Resp status stable. Completed Tamiflu. Taper off prednisone   New onset A-fib  Elevated troponin-from demand ischemia 4.3-second pause/A-fib with slow ventricular response rate with heart rate in 30s on 06/04/23 PM: PTA on metoprolol 25 twice daily.Echo reassuring.  Seen by Cardiology> metoprolol increased-but subsequently discontinued 4/4  evening due to pauses and bradycardia-heart rate now maintained after beta-blocker washout. Seen by cardiology no further recommendation.  TSH depressed but normal FT4-will need follow-up in 3 to 4 weeks. Not a ideal candidate for anticoagulation for long-term per cardiology due to fall risk.  Acute metabolic /toxic encephalopathy: Patient agitated and confused 4/4 night - multifactorial in the setting of advanced age, complex co-morbidities, and likely from anesthesia for EGD.At baseline he is aaox3 and no memory issues-sometime forgets day of the week.   Mentation has improved.  Keep mittens off for SNF. UA W/O UTI  Coffee-ground emesis ABLA Multiple superficial duodenal ulcers Leukocytosis 18k-resolving: GI following closely heparin has been discontinued 4/3> underwent EGD with finding as above with multiple superficial duodenal ulcers hiatal hernia.  Cannot do PPI twice daily x 8 weeks then daily. S/ p 1 unit PRBC and hb up in 8 Recent Labs  Lab 06/04/23 1155 06/04/23 1412 06/04/23 1923 06/05/23 0246 06/06/23 0244  HGB 8.8* 9.5* 7.7* 7.5* 8.0*  HCT 28.8* 28.0* 24.9* 23.7* 25.0*    Macrocytic anemia  Thrombocytopenia: B12 folate level stable.Monitor CBC-platelet trending up.   Low TSH: Normal free T4,monitor as outpatient  Hypokalemia: Replace.  Hypertensive urgency:  BP borderline controlled. Cont lisinopril, hydralazine added. Off metoprolol due to bradycardia   Fall at home PMH  of SDH: Continue PT OT and planning for SNF at discharge   AKI on CKD 3a Elevated creatinine on admission 1.32: last values from 2014 likely has baseline CKD.Level fluctuating from 1.2-1.4 here- suspect CKD stage IIIa - creat peaked to 1.7 indicating some component of AKI. Given gentle IV fluid hydration  and creat DONW IN 1.1 NOW Recent Labs    05/31/23 1410 06/01/23 0409 06/02/23 0445 06/03/23 0104 06/04/23 0348 06/04/23 1412 06/05/23 0246 06/06/23 0244 06/07/23 0254  BUN 29* 26*  50* 88* 87* 86* 73* 60* 49*  CREATININE 1.32* 1.23 1.44* 1.41* 1.20 1.70* 1.41* 1.36* 1.18  CO2 25 22 24  21* 20*  --  22 24 23   K 3.7 3.5 3.1* 4.2 3.9 3.7 3.8 3.5 3.4*    Hyperlipidemia: Continue statin  DVT prophylaxis:  Code Status:   Code Status: Full Code Family Communication: plan of care discussed with patient at bedside.  Updated patient's daughter and son [name] Patient status is: Remains hospitalized because of severity of illness Level of care: Stepdown   Dispo: The patient is from: home            Anticipated disposition: SNF in next 24 . AVOID MITTEN  Objective: Vitals last 24 hrs: Vitals:   06/07/23 0351 06/07/23 0400 06/07/23 0600 06/07/23 0827  BP:  (!) 183/72 (!) 183/59 (!) 185/53  Pulse:  71 64   Resp:  (!) 23 20   Temp: 98.3 F (36.8 C)   98.1 F (36.7 C)  TempSrc: Oral     SpO2:  98% 96%   Weight:      Height:       Weight change:   Physical Examination: General exam: alert awake, oriented HEENT:Oral mucosa moist, Ear/Nose WNL grossly Respiratory system: Bilaterally clear BS,no use of accessory muscle Cardiovascular system: S1 & S2 +, No JVD. Gastrointestinal system: Abdomen soft,NT,ND, BS+ Nervous System: Alert, awake, moving all extremities,and following commands. Extremities: LE edema neg,distal peripheral pulses palpable and warm.  Skin: No rashes,no icterus. MSK: Normal muscle bulk,tone, power   Medications reviewed:  Scheduled Meds:  aspirin EC  81 mg Oral Daily   atorvastatin  40 mg Oral Daily   Chlorhexidine Gluconate Cloth  6 each Topical Daily   famotidine  20 mg Oral QHS   hydrALAZINE  25 mg Oral TID   latanoprost  1 drop Left Eye QHS   lisinopril  40 mg Oral Daily   pantoprazole (PROTONIX) IV  40 mg Intravenous Q12H   predniSONE  10 mg Oral Q breakfast   timolol  1 drop Left Eye BID  Continuous Infusions:    Diet Order             Diet regular Room service appropriate? Yes; Fluid consistency: Thin  Diet effective now                   Intake/Output Summary (Last 24 hours) at 06/07/2023 1128 Last data filed at 06/07/2023 0810 Gross per 24 hour  Intake 50 ml  Output 1100 ml  Net -1050 ml   Net IO Since Admission: 2,378.56 mL [06/07/23 1128]  Wt Readings from Last 3 Encounters:  06/04/23 79.4 kg  04/28/18 84.2 kg  04/13/17 81.6 kg     Unresulted Labs (From admission, onward)     Start     Ordered   06/04/23 0500  Basic metabolic panel with GFR  Daily,   R      06/03/23 1118          Data Reviewed: I have personally reviewed following labs and imaging studies ( see epic result tab) CBC: Recent Labs  Lab 05/31/23 1410 06/01/23 0409 06/02/23 0445 06/03/23 0104 06/04/23 0348 06/04/23 1155 06/04/23 1412 06/04/23 1923 06/05/23 0246 06/06/23 0244  WBC 7.3   < > 13.8* 18.3* 11.6*  --   --   --  10.9* 10.8*  NEUTROABS 5.5  --   --   --   --   --   --   --   --   --  HGB 11.5*   < > 10.3* 9.8* 8.0* 8.8* 9.5* 7.7* 7.5* 8.0*  HCT 36.0*   < > 31.5* 31.0* 26.1* 28.8* 28.0* 24.9* 23.7* 25.0*  MCV 98.6   < > 96.6 101.0* 102.4*  --   --   --  100.4* 97.7  PLT 138*   < > 132* 171 141*  --   --   --  145* 143*   < > = values in this interval not displayed.   CMP: Recent Labs  Lab 05/31/23 1410 06/01/23 0409 06/03/23 0104 06/04/23 0348 06/04/23 1412 06/05/23 0246 06/06/23 0244 06/07/23 0254  NA 137   < > 130* 133* 137 136 141 141  K 3.7   < > 4.2 3.9 3.7 3.8 3.5 3.4*  CL 102   < > 101 103 103 107 111 111  CO2 25   < > 21* 20*  --  22 24 23   GLUCOSE 127*   < > 135* 105* 84 130* 132* 115*  BUN 29*   < > 88* 87* 86* 73* 60* 49*  CREATININE 1.32*   < > 1.41* 1.20 1.70* 1.41* 1.36* 1.18  CALCIUM 9.3   < > 8.6* 9.1  --  8.9 8.5* 8.4*  MG 1.7  --   --   --   --   --   --   --    < > = values in this interval not displayed.   GFR: Estimated Creatinine Clearance: 39.4 mL/min (by C-G formula based on SCr of 1.18 mg/dL). Recent Labs  Lab 05/31/23 1410 06/01/23 0409  AST 43* 66*  ALT 16 19   ALKPHOS 62 55  BILITOT 0.8 1.1  PROT 6.2* 5.8*  ALBUMIN 3.5 3.2*   Antimicrobials/Microbiology: Anti-infectives (From admission, onward)    Start     Dose/Rate Route Frequency Ordered Stop   06/01/23 2200  oseltamivir (TAMIFLU) capsule 30 mg        30 mg Oral 2 times daily 06/01/23 0502 06/05/23 2155   06/01/23 0515  oseltamivir (TAMIFLU) capsule 75 mg        75 mg Oral  Once 06/01/23 0500 06/01/23 0603         Component Value Date/Time   SDES  05/31/2023 1412    BLOOD LEFT ANTECUBITAL Performed at [facility], [address], [place], [place] [address]    SPECREQUEST  05/31/2023 1412    BOTTLES DRAWN AEROBIC ONLY Blood Culture results may not be optimal due to an inadequate volume of blood received in culture bottles Performed at [facility], [address], [place], [place] [address]    CULT  05/31/2023 1412    NO GROWTH 5 DAYS Performed at [facility], [address], [place], [place] [address]    REPTSTATUS 06/05/2023 FINAL 05/31/2023 1412     Radiology Studies: No results found.   LOS: 4 days   Total time spent in review of labs and imaging, patient evaluation, formulation of plan, documentation and communication with patient/family: 35 minutes  Lanae Boast, MD [facility] 06/07/2023, 11:28 AM

## 2023-06-07 NOTE — Progress Notes (Signed)
 RUE is more edematous and erythematous today. Patient also complains of pain 6/10 with and without palpation. Hospitalist provider contacted to assist.

## 2023-06-07 NOTE — Anesthesia Postprocedure Evaluation (Signed)
 Anesthesia Post Note  Patient: [name]  Procedure(s) Performed: EGD (ESOPHAGOGASTRODUODENOSCOPY) BIOPSY, SKIN, SUBCUTANEOUS TISSUE, OR MUCOUS MEMBRANE     Patient location during evaluation: PACU Anesthesia Type: MAC Level of consciousness: awake and alert Pain management: pain level controlled Vital Signs Assessment: post-procedure vital signs reviewed and stable Respiratory status: spontaneous breathing Cardiovascular status: stable Anesthetic complications: no   No notable events documented.  Last Vitals:  Vitals:   06/07/23 1200 06/07/23 1657  BP: (!) 157/85   Pulse: 72   Resp: (!) 26   Temp: 37 C 36.8 C  SpO2: 95%     Last Pain:  Vitals:   06/07/23 1657  TempSrc: Oral  PainSc:                  [name]

## 2023-06-07 NOTE — Progress Notes (Signed)
 Physical Therapy Treatment Patient Details Name: [name] MRN: [id number] DOB: [birth year] Today's Date: 06/07/2023   History of Present Illness 89 yo male admitted with Afib, falls, flu (+). Hx of CAD, BPH, gout, TURP    PT Comments  Max assist for supine to sit. Mod assist sit to stand, min assist to pivot to recliner. Pt performed seated BUE/LE strengthening exercises. Increased assistance required today for mobility.    If plan is discharge home, recommend the following: A little help with walking and/or transfers;A little help with bathing/dressing/bathroom;Assistance with cooking/housework;Assist for transportation;Help with stairs or ramp for entrance   Can travel by private vehicle     No  Equipment Recommendations  None recommended by PT    Recommendations for Other Services       Precautions / Restrictions Precautions Precautions: Fall Recall of Precautions/Restrictions: Intact Restrictions Weight Bearing Restrictions Per Provider Order: No Other Position/Activity Restrictions: droplet precautions     Mobility  Bed Mobility Overal bed mobility: Needs Assistance Bed Mobility: Supine to Sit     Supine to sit: HOB elevated, Used rails, Max assist     General bed mobility comments: assist to raise trunk and pivot hips to EOB    Transfers Overall transfer level: Needs assistance Equipment used: Rolling walker (2 wheels) Transfers: Sit to/from Stand, Bed to chair/wheelchair/BSC Sit to Stand: Mod assist   Step pivot transfers: Min assist       General transfer comment: mod assist to power up, min A for balance for SPT; VCs for proximity to RW    Ambulation/Gait                   Stairs             Wheelchair Mobility     Tilt Bed    Modified Rankin (Stroke Patients Only)       Balance Overall balance assessment: Needs assistance Sitting-balance support: Bilateral upper extremity supported, Feet supported Sitting balance-Leahy  Scale: Good     Standing balance support: Bilateral upper extremity supported, During functional activity, Reliant on assistive device for balance Standing balance-Leahy Scale: Poor                              Communication Communication Communication: No apparent difficulties Factors Affecting Communication: Hearing impaired  Cognition Arousal: Alert Behavior During Therapy: WFL for tasks assessed/performed   PT - Cognitive impairments: No apparent impairments                         Following commands: Intact      Cueing Cueing Techniques: Verbal cues  Exercises General Exercises - Upper Extremity Shoulder Flexion: AROM, Both, 10 reps, Seated General Exercises - Lower Extremity Long Arc Quad: AROM, Both, Seated, 10 reps Hip Flexion/Marching: AROM, Both, Seated, 10 reps    General Comments        Pertinent Vitals/Pain Pain Assessment Pain Assessment: No/denies pain Pain Score: 0-No pain    Home Living                          Prior Function            PT Goals (current goals can now be found in the care plan section) Acute Rehab PT Goals Patient Stated Goal: none stated PT Goal Formulation: Patient unable to participate in goal setting Time For Goal Achievement:  06/15/23 Potential to Achieve Goals: Fair Progress towards PT goals: Progressing toward goals    Frequency    Min 2X/week      PT Plan      Co-evaluation              AM-PAC PT "6 Clicks" Mobility   Outcome Measure  Help needed turning from your back to your side while in a flat bed without using bedrails?: A Lot Help needed moving from lying on your back to sitting on the side of a flat bed without using bedrails?: A Lot Help needed moving to and from a bed to a chair (including a wheelchair)?: A Lot Help needed standing up from a chair using your arms (e.g., wheelchair or bedside chair)?: A Lot Help needed to walk in hospital room?: A Lot Help  needed climbing 3-5 steps with a railing? : Total 6 Click Score: 11    End of Session Equipment Utilized During Treatment: Gait belt Activity Tolerance: Patient tolerated treatment well;Patient limited by fatigue Patient left: in chair;with call bell/phone within reach;with chair alarm set;with family/visitor present Nurse Communication: Mobility status PT Visit Diagnosis: Muscle weakness (generalized) (M62.81);History of falling (Z91.81);Difficulty in walking, not elsewhere classified (R26.2)     Time: 1420-1440 PT Time Calculation (min) (ACUTE ONLY): 20 min  Charges:    $Therapeutic Activity: 8-22 mins PT General Charges $$ ACUTE PT VISIT: 1 Visit                     Tamala Ser PT 06/07/2023  Acute Rehabilitation Services  Office [phone]

## 2023-06-08 ENCOUNTER — Inpatient Hospital Stay (HOSPITAL_COMMUNITY)

## 2023-06-08 DIAGNOSIS — R52 Pain, unspecified: Secondary | ICD-10-CM | POA: Diagnosis not present

## 2023-06-08 DIAGNOSIS — I480 Paroxysmal atrial fibrillation: Secondary | ICD-10-CM | POA: Diagnosis not present

## 2023-06-08 DIAGNOSIS — R262 Difficulty in walking, not elsewhere classified: Secondary | ICD-10-CM | POA: Diagnosis not present

## 2023-06-08 DIAGNOSIS — D696 Thrombocytopenia, unspecified: Secondary | ICD-10-CM | POA: Diagnosis not present

## 2023-06-08 DIAGNOSIS — E785 Hyperlipidemia, unspecified: Secondary | ICD-10-CM | POA: Diagnosis not present

## 2023-06-08 DIAGNOSIS — M7989 Other specified soft tissue disorders: Secondary | ICD-10-CM

## 2023-06-08 DIAGNOSIS — N400 Enlarged prostate without lower urinary tract symptoms: Secondary | ICD-10-CM | POA: Diagnosis not present

## 2023-06-08 DIAGNOSIS — R531 Weakness: Secondary | ICD-10-CM | POA: Diagnosis not present

## 2023-06-08 DIAGNOSIS — I4891 Unspecified atrial fibrillation: Secondary | ICD-10-CM | POA: Diagnosis not present

## 2023-06-08 DIAGNOSIS — G9341 Metabolic encephalopathy: Secondary | ICD-10-CM | POA: Diagnosis not present

## 2023-06-08 DIAGNOSIS — I10 Essential (primary) hypertension: Secondary | ICD-10-CM | POA: Diagnosis not present

## 2023-06-08 DIAGNOSIS — R41841 Cognitive communication deficit: Secondary | ICD-10-CM | POA: Diagnosis not present

## 2023-06-08 DIAGNOSIS — K219 Gastro-esophageal reflux disease without esophagitis: Secondary | ICD-10-CM | POA: Diagnosis not present

## 2023-06-08 DIAGNOSIS — M109 Gout, unspecified: Secondary | ICD-10-CM | POA: Diagnosis not present

## 2023-06-08 DIAGNOSIS — Z7401 Bed confinement status: Secondary | ICD-10-CM | POA: Diagnosis not present

## 2023-06-08 DIAGNOSIS — M6281 Muscle weakness (generalized): Secondary | ICD-10-CM | POA: Diagnosis not present

## 2023-06-08 DIAGNOSIS — J101 Influenza due to other identified influenza virus with other respiratory manifestations: Secondary | ICD-10-CM | POA: Diagnosis not present

## 2023-06-08 DIAGNOSIS — I2510 Atherosclerotic heart disease of native coronary artery without angina pectoris: Secondary | ICD-10-CM | POA: Diagnosis not present

## 2023-06-08 DIAGNOSIS — H40212 Acute angle-closure glaucoma, left eye: Secondary | ICD-10-CM | POA: Diagnosis not present

## 2023-06-08 DIAGNOSIS — J209 Acute bronchitis, unspecified: Secondary | ICD-10-CM | POA: Diagnosis not present

## 2023-06-08 DIAGNOSIS — I6529 Occlusion and stenosis of unspecified carotid artery: Secondary | ICD-10-CM | POA: Diagnosis not present

## 2023-06-08 LAB — HEMOGLOBIN AND HEMATOCRIT, BLOOD
HCT: 27.7 % — ABNORMAL LOW (ref 39.0–52.0)
Hemoglobin: 8.6 g/dL — ABNORMAL LOW (ref 13.0–17.0)

## 2023-06-08 LAB — BASIC METABOLIC PANEL WITH GFR
Anion gap: 6 (ref 5–15)
BUN: 35 mg/dL — ABNORMAL HIGH (ref 8–23)
CO2: 24 mmol/L (ref 22–32)
Calcium: 8.6 mg/dL — ABNORMAL LOW (ref 8.9–10.3)
Chloride: 111 mmol/L (ref 98–111)
Creatinine, Ser: 1.19 mg/dL (ref 0.61–1.24)
GFR, Estimated: 58 mL/min — ABNORMAL LOW (ref >60)
Glucose, Bld: 109 mg/dL — ABNORMAL HIGH (ref 70–99)
Potassium: 3.4 mmol/L — ABNORMAL LOW (ref 3.5–5.1)
Sodium: 141 mmol/L (ref 135–145)

## 2023-06-08 MED ORDER — POTASSIUM CHLORIDE CRYS ER 20 MEQ PO TBCR
20.0000 meq | EXTENDED_RELEASE_TABLET | Freq: Once | ORAL | Status: AC
  Administered 2023-06-08: 20 meq via ORAL
  Filled 2023-06-08: qty 1

## 2023-06-08 MED ORDER — PREDNISONE 10 MG PO TABS: 10.0000 mg | ORAL_TABLET | Freq: Every day | ORAL | Status: AC

## 2023-06-08 MED ORDER — MELATONIN 5 MG PO TABS: 5.0000 mg | ORAL_TABLET | Freq: Every evening | ORAL | Status: DC | PRN

## 2023-06-08 MED ORDER — HYDRALAZINE HCL 50 MG PO TABS: 50.0000 mg | ORAL_TABLET | Freq: Three times a day (TID) | ORAL | Status: DC

## 2023-06-08 MED ORDER — ATORVASTATIN CALCIUM 40 MG PO TABS: 40.0000 mg | ORAL_TABLET | Freq: Every day | ORAL | Status: DC

## 2023-06-08 MED ORDER — PANTOPRAZOLE SODIUM 40 MG PO TBEC: 40.0000 mg | DELAYED_RELEASE_TABLET | Freq: Two times a day (BID) | ORAL | Status: DC

## 2023-06-08 MED ORDER — HYDRALAZINE HCL 50 MG PO TABS
50.0000 mg | ORAL_TABLET | Freq: Three times a day (TID) | ORAL | Status: DC
  Administered 2023-06-08 (×2): 50 mg via ORAL
  Filled 2023-06-08 (×2): qty 1

## 2023-06-08 NOTE — Discharge Summary (Signed)
 Physician Discharge Summary  [name] EPP:[id number] DOB: 1930-08-12 DOA: 05/31/2023  PCP: Gaspar Garbe, MD  Admit date: 05/31/2023 Discharge date: 06/08/2023 Recommendations for Outpatient Follow-up:  Follow up with PCP in 1 weeks-call for appointment Please obtain BMP/CBC in one week  Discharge Dispo: SNF Discharge Condition: Stable Code Status:   Code Status: Full Code Diet recommendation:  Diet Order             Diet regular Room service appropriate? Yes; Fluid consistency: Thin  Diet effective now                    Brief/Interim Summary: 89 year old man presented after a fall at home.  PMH CAD, PCI.  Evaluation revealed influenza A, atrial fibrillation rate controlled.  Patient admitted for flu and new A-fib. Continue with Tamiflu and steroid, initially received heparin drip but stopped due to coffee-ground emesis underwent endoscopy-Multiple superficial duodenal ulcers-managed with PPI. Felt to be a poor candidate for anticoagulation due to his risk of falls. Patient had 4.3-second pause, A-fib with slow ventricular response rate transferred to stepdown metoprolol discontinued following meals heart rate is stabilized.  He also had delirium in the setting of postanesthesia hospitalization-which subsequently resolved He remains weak and deconditioned and planning for skilled nursing facility on 06/08/23.  Consultants Cardiology  Gastroenterology  Procedures/Events EGD 4/4> multiple superficial duodenal ulcers without stigmata likely secondary to NSAIDs, gastric biopsies taken to rule out H. pylori 200 e. incidentally cbd stricture.>GI advised PPI 40 twice daily x 8 weeks then daily avoid NSAIDs follow-up biopsy  Subjective: Seen and examined this morning He is alert awake resting comfortably communicative mentation better Blood pressure high improving after hydralazine Overnight BP hypertensive meds adjusted Labs with improved creatinine 1.1  Discharge  diagnoses: Acute bronchitis with influenza A infection: Resp status stable. Completed Tamiflu. Tapering off prednisone   New onset A-fib  Elevated troponin-from demand ischemia 4.3-second pause/A-fib with slow ventricular response rate with heart rate in 30s on 06/04/23 PM: PTA on metoprolol 25 twice daily.Echo ok Seen by Cardiology> metoprolol increased-but subsequently discontinued 4/4 evening due to pauses and bradycardia-heart rate now maintained after beta-blocker washout- HR better now. TSH depressed but normal FT4-will need follow-up in 3 to 4 weeks. Not a ideal candidate for anticoagulation for long-term per cardiology due to fall risk.  Acute metabolic /toxic encephalopathy: Patient agitated and confused 4/4 night - multifactorial in the setting of advanced age, complex co-morbidities, and likely from anesthesia for EGD.At baseline he is aaox3 and no memory issues-sometime forgets day of the week.   UA W/O UTI. Mentation has improved.  Coffee-ground emesis ABLA Multiple superficial duodenal ulcers Leukocytosis 18k-resolving: Seen by GI- heparin has been discontinued 4/3>underwent EGD with finding as above with multiple superficial duodenal ulcers hiatal hernia.  Cannot do PPI twice daily x 8 weeks then daily. S/ p 1 unit PRBC and hb up in 8-recheck hemoglobin before discharge, at times having darker stool likely old blood. Recent Labs  Lab 06/04/23 1155 06/04/23 1412 06/04/23 1923 06/05/23 0246 06/06/23 0244  HGB 8.8* 9.5* 7.7* 7.5* 8.0*  HCT 28.8* 28.0* 24.9* 23.7* 25.0*    Macrocytic anemia  Thrombocytopenia: B12 folate level stable.Monitor CBC-platelet trending up.   Low TSH: Normal free T4,monitor as outpatient  Hypokalemia: Replaced.  Hypertensive urgency:  BP borderline controlled-hydralazine was added 4/7 and adjusted.Continue lisinopril.  Resume HCTZ.  Off metoprolol due to bradycardia.  He will need close follow-up with PCP or cardiology   AKI on CKD  3a Elevated creatinine on admission 1.32: last values from 2014 likely has baseline CKD.Level fluctuating from 1.2-1.4 here- suspect CKD stage IIIa - creat peaked to 1.7 indicating some component of AKI. Given gentle IV fluid hydration  and creat down in 1.1 Recent Labs    05/31/23 1410 06/01/23 0409 06/02/23 0445 06/03/23 0104 06/04/23 0348 06/04/23 1412 06/05/23 0246 06/06/23 0244 06/07/23 0254 06/08/23 0305  BUN 29* 26* 50* 88* 87* 86* 73* 60* 49* 35*  CREATININE 1.32* 1.23 1.44* 1.41* 1.20 1.70* 1.41* 1.36* 1.18 1.19  CO2 25 22 24  21* 20*  --  22 24 23 24   K 3.7 3.5 3.1* 4.2 3.9 3.7 3.8 3.5 3.4* 3.4*    Hyperlipidemia: Continue statin  Fall at home PMH  of SDH: Deconditioning/debility: Planning for discharge to skilled nursing facility once off mitten x 24 hours   Discharge Exam: Vitals:   06/08/23 1005 06/08/23 1200  BP: (!) 160/46   Pulse: 71   Resp: (!) 23   Temp:  97.8 F (36.6 C)  SpO2: 96%    General: Pt is alert, awake, not in acute distress Cardiovascular: RRR, S1/S2 +, no rubs, no gallops Respiratory: CTA bilaterally, no wheezing, no rhonchi Abdominal: Soft, NT, ND, bowel sounds + Extremities: no edema, no cyanosis  Discharge Instructions  Discharge Instructions     Discharge instructions   Complete by: As directed    Please call call MD or return to ER for similar or worsening recurring problem that brought you to hospital or if any fever,nausea/vomiting,abdominal pain, uncontrolled pain, chest pain,  shortness of breath or any other alarming symptoms.  Please follow-up your doctor as instructed in a week time and call the office for appointment.  Please avoid alcohol, smoking, or any other illicit substance and maintain healthy habits including taking your regular medications as prescribed.  You were cared for by a hospitalist during your hospital stay. If you have any questions about your discharge medications or the care you received while you  were in the hospital after you are discharged, you can call the unit and ask to speak with the hospitalist on call if the hospitalist that took care of you is not available.  Once you are discharged, your primary care physician will handle any further medical issues. Please note that NO REFILLS for any discharge medications will be authorized once you are discharged, as it is imperative that you return to your primary care physician (or establish a relationship with a primary care physician if you do not have one) for your aftercare needs so that they can reassess your need for medications and monitor your lab values   Increase activity slowly   Complete by: As directed    No wound care   Complete by: As directed       Allergies as of 06/08/2023       Reactions   Clobetasol Propionate Rash        Medication List     STOP taking these medications    metoprolol tartrate 25 MG tablet Commonly known as: LOPRESSOR   simvastatin 80 MG tablet Commonly known as: ZOCOR       TAKE these medications    allopurinol 300 MG tablet Commonly known as: ZYLOPRIM Take 300 mg by mouth Daily.   aspirin 81 MG tablet Take 81 mg by mouth daily.   atorvastatin 40 MG tablet Commonly known as: LIPITOR Take 1 tablet (40 mg total) by mouth daily. Start taking on: June 09, 2023   docusate sodium 100 MG capsule Commonly known as: COLACE Take 100 mg by mouth daily as needed (for constipation).   famotidine 20 MG tablet Commonly known as: PEPCID Take 20 mg by mouth at bedtime.   finasteride 5 MG tablet Commonly known as: PROSCAR Take 5 mg by mouth Daily.   fluticasone 50 MCG/ACT nasal spray Commonly known as: FLONASE Place 1 spray into both nostrils 2 (two) times daily as needed for allergies or rhinitis.   hydrALAZINE 50 MG tablet Commonly known as: APRESOLINE Take 1 tablet (50 mg total) by mouth 3 (three) times daily.   hydrochlorothiazide 25 MG tablet Commonly known as:  HYDRODIURIL Take 25 mg by mouth daily.   latanoprost 0.005 % ophthalmic solution Commonly known as: XALATAN Place 1 drop into the left eye at bedtime.   lisinopril 40 MG tablet Commonly known as: ZESTRIL Take 40 mg by mouth Daily.   melatonin 5 MG Tabs Take 1 tablet (5 mg total) by mouth at bedtime as needed.   nitroGLYCERIN 0.4 MG SL tablet Commonly known as: NITROSTAT Place 1 tablet (0.4 mg total) under the tongue every 5 (five) minutes as needed.   pantoprazole 40 MG tablet Commonly known as: PROTONIX Take 1 tablet (40 mg total) by mouth 2 (two) times daily.   predniSONE 10 MG tablet Commonly known as: DELTASONE Take 1 tablet (10 mg total) by mouth daily with breakfast for 2 days. Start taking on: June 09, 2023   tamsulosin 0.4 MG Caps capsule Commonly known as: FLOMAX Take 0.4 mg by mouth daily.   timolol 0.5 % ophthalmic solution Commonly known as: TIMOPTIC Place 1 drop into the left eye 2 (two) times daily.        Contact information for after-discharge care     Destination     [facility] Preferred SNF .   Service: Skilled Nursing Contact information: [address] [place] [address] [phone]                    Allergies  Allergen Reactions   Clobetasol Propionate Rash    The results of significant diagnostics from this hospitalization (including imaging, microbiology, ancillary and laboratory) are listed below for reference.    Microbiology: Recent Results (from the past 240 hours)  Blood Culture (routine x 2)     Status: None   Collection Time: 05/31/23  2:10 PM   Specimen: BLOOD LEFT ARM  Result Value Ref Range Status   Specimen Description   Final    BLOOD LEFT ARM Performed at [facility], [address] [place], [place] [address]    Special Requests   Final    BOTTLES DRAWN AEROBIC AND ANAEROBIC Blood Culture results may not be optimal due to an inadequate volume of  blood received in culture bottles Performed at [facility], [address], [place], [place] [address]    Culture   Final    NO GROWTH 5 DAYS Performed at [facility], [address], [place], [place] [address]    Report Status 06/05/2023 FINAL  Final  Blood Culture (routine x 2)     Status: None   Collection Time: 05/31/23  2:12 PM   Specimen: BLOOD  Result Value Ref Range Status   Specimen Description   Final    BLOOD LEFT ANTECUBITAL Performed at [facility], [address], [place], [place] [address]    Special Requests  Final    BOTTLES DRAWN AEROBIC ONLY Blood Culture results may not be optimal due to an inadequate volume of blood received in culture bottles Performed at [facility], [address] [place], [place] [address]    Culture   Final    NO GROWTH 5 DAYS Performed at [facility], [address] [place], [place] [address]    Report Status 06/05/2023 FINAL  Final  Resp panel by RT-PCR (RSV, Flu A&B, Covid) Anterior Nasal Swab     Status: Abnormal   Collection Time: 05/31/23  2:28 PM   Specimen: Anterior Nasal Swab  Result Value Ref Range Status   SARS Coronavirus 2 by RT PCR NEGATIVE NEGATIVE Final    Comment: (NOTE) SARS-CoV-2 target nucleic acids are NOT DETECTED.  The SARS-CoV-2 RNA is generally detectable in upper respiratory specimens during the acute phase of infection. The lowest concentration of SARS-CoV-2 viral copies this assay can detect is 138 copies/mL. A negative result does not preclude SARS-Cov-2 infection and should not be used as the sole basis for treatment or other patient management decisions. A negative result may occur with  improper specimen collection/handling, submission of specimen other than nasopharyngeal swab, presence of viral mutation(s) within the areas targeted by this assay, and inadequate number of viral copies(<138 copies/mL). A negative result must  be combined with clinical observations, patient history, and epidemiological information. The expected result is Negative.  Fact Sheet for Patients:  BloggerCourse.com  Fact Sheet for Healthcare Providers:  SeriousBroker.it  This test is no t yet approved or cleared by the [place] FDA and  has been authorized for detection and/or diagnosis of SARS-CoV-2 by FDA under an Emergency Use Authorization (EUA). This EUA will remain  in effect (meaning this test can be used) for the duration of the COVID-19 declaration under Section 564(b)(1) of the Act, 21 U.S.C.section 360bbb-3(b)(1), unless the authorization is terminated  or revoked sooner.       Influenza A by PCR POSITIVE (A) NEGATIVE Final   Influenza B by PCR NEGATIVE NEGATIVE Final    Comment: (NOTE) The Xpert Xpress SARS-CoV-2/FLU/RSV plus assay is intended as an aid in the diagnosis of influenza from Nasopharyngeal swab specimens and should not be used as a sole basis for treatment. Nasal washings and aspirates are unacceptable for Xpert Xpress SARS-CoV-2/FLU/RSV testing.  Fact Sheet for Patients: BloggerCourse.com  Fact Sheet for Healthcare Providers: SeriousBroker.it  This test is not yet approved or cleared by the [place] FDA and has been authorized for detection and/or diagnosis of SARS-CoV-2 by FDA under an Emergency Use Authorization (EUA). This EUA will remain in effect (meaning this test can be used) for the duration of the COVID-19 declaration under Section 564(b)(1) of the Act, 21 U.S.C. section 360bbb-3(b)(1), unless the authorization is terminated or revoked.     Resp Syncytial Virus by PCR NEGATIVE NEGATIVE Final    Comment: (NOTE) Fact Sheet for Patients: BloggerCourse.com  Fact Sheet for Healthcare Providers: SeriousBroker.it  This test is  not yet approved or cleared by the [place] FDA and has been authorized for detection and/or diagnosis of SARS-CoV-2 by FDA under an Emergency Use Authorization (EUA). This EUA will remain in effect (meaning this test can be used) for the duration of the COVID-19 declaration under Section 564(b)(1) of the Act, 21 U.S.C. section 360bbb-3(b)(1), unless the authorization is terminated or revoked.  Performed at [facility], [address] [place], [place] [address]   MRSA Next  Gen by PCR, Nasal     Status: None   Collection Time: 06/04/23  6:30 PM   Specimen: Nasal Mucosa; Nasal Swab  Result Value Ref Range Status   MRSA by PCR Next Gen NOT DETECTED NOT DETECTED Final    Comment: (NOTE) The GeneXpert MRSA Assay (FDA approved for NASAL specimens only), is one component of a comprehensive MRSA colonization surveillance program. It is not intended to diagnose MRSA infection nor to guide or monitor treatment for MRSA infections. Test performance is not FDA approved in patients less than 35 years old. Performed at [facility], [address], [place], [place] [address]     Procedures/Studies: VAS [place] UPPER EXTREMITY VENOUS DUPLEX Result Date: 06/08/2023 UPPER VENOUS STUDY  Patient Name:  [name]  Date of Exam:   06/08/2023 Medical Rec #: [id number]    Accession #:    [id number] Date of Birth: [birth year]     Patient Gender: M Patient Age:   89 years Exam Location:  [facility] Procedure:      VAS [place] UPPER EXTREMITY VENOUS DUPLEX Referring Phys: Enyah Moman --------------------------------------------------------------------------------  Indications: Pain, Edema, and Erythema Limitations: Poor ultrasound/tissue interface. Comparison Study: No previous exams Performing Technologist: Jody Hill RVT, RDMS  Examination Guidelines: A complete evaluation includes B-mode imaging, spectral Doppler, color Doppler, and power Doppler as needed of all accessible  portions of each vessel. Bilateral testing is considered an integral part of a complete examination. Limited examinations for reoccurring indications may be performed as noted.  Right Findings: +----------+------------+---------+-----------+----------+-----------------+ RIGHT     CompressiblePhasicitySpontaneousProperties     Summary      +----------+------------+---------+-----------+----------+-----------------+ IJV           Full       Yes       Yes                                +----------+------------+---------+-----------+----------+-----------------+ Subclavian    Full       Yes       Yes                                +----------+------------+---------+-----------+----------+-----------------+ Axillary      Full       Yes       Yes                                +----------+------------+---------+-----------+----------+-----------------+ Brachial      Full       Yes       Yes                              +----------+------------+---------+-----------+----------+-----------------+ Radial        Full                                                  +----------+------------+---------+-----------+----------+-----------------+ Ulnar         Full                                                  +----------+------------+---------+-----------+----------+-----------------+  Cephalic      None       No        No               Age Indeterminate +----------+------------+---------+-----------+----------+-----------------+ Basilic       Full       Yes       Yes                                +----------+------------+---------+-----------+----------+-----------------+ Brachial, radial, and ulnar veins not well visualized due to small size of vessels.  Left Findings: +----------+------------+---------+-----------+----------+-------+ LEFT      CompressiblePhasicitySpontaneousPropertiesSummary  +----------+------------+---------+-----------+----------+-------+ Subclavian    Full       Yes       Yes                      +----------+------------+---------+-----------+----------+-------+  Summary:  Right: No evidence of deep vein thrombosis in the upper extremity. Findings consistent with age indeterminate superficial vein thrombosis involving the right cephalic vein.  Left: No evidence of thrombosis in the subclavian.  *See table(s) above for measurements and observations.    Preliminary    ECHOCARDIOGRAM COMPLETE Result Date: 06/01/2023    ECHOCARDIOGRAM REPORT   Patient Name:   [name] Date of Exam: 06/01/2023 Medical Rec #:  [id number]   Height:       68.0 in Accession #:    [id number]  Weight:       173.1 lb Date of Birth:  [birth year]    BSA:          1.922 m Patient Age:    89 years    BP:           164/67 mmHg Patient Gender: M           HR:           90 bpm. Exam Location:  Inpatient Procedure: 2D Echo, Cardiac Doppler and Color Doppler (Both Spectral and Color            Flow Doppler were utilized during procedure). Indications:    Atrial fibrillation  History:        Patient has no prior history of Echocardiogram examinations.                 CAD, Arrythmias:Atrial Fibrillation; Risk Factors:Dyslipidemia                 and Hypertension. Carotid stenosis.  Sonographer:    Vern Claude Referring Phys: [id number] Eduard Clos  Sonographer Comments: Image acquisition challenging due to respiratory motion. IMPRESSIONS  1. There is increased flow velocity across the LV outflow tract, due to hyperdynamic left ventricular contractility. There does not appear to be true dynamic LV outflow obstruction. Left ventricular ejection fraction, by estimation, is 70 to 75%. The left ventricle has hyperdynamic function. The left ventricle has no regional wall motion abnormalities. Left ventricular diastolic function could not be evaluated.  2. Right ventricular systolic function is normal. The right  ventricular size is normal. Tricuspid regurgitation signal is inadequate for assessing PA pressure.  3. Left atrial size was mild to moderately dilated.  4. The mitral valve is normal in structure. No evidence of mitral valve regurgitation.  5. The aortic valve is normal in structure. There is mild calcification of the aortic valve. Aortic valve regurgitation is mild. Aortic valve sclerosis is present, with no evidence of aortic valve  stenosis. FINDINGS  Left Ventricle: There is increased flow velocity across the LV outflow tract, due to hyperdynamic left ventricular contractility. There does not appear to be true dynamic LV outflow obstruction. Left ventricular ejection fraction, by estimation, is 70 to 75%. The left ventricle has hyperdynamic function. The left ventricle has no regional wall motion abnormalities. The left ventricular internal cavity size was normal in size. There is borderline concentric left ventricular hypertrophy. Left ventricular diastolic function could not be evaluated due to atrial fibrillation. Left ventricular diastolic function could not be evaluated. Right Ventricle: The right ventricular size is normal. No increase in right ventricular wall thickness. Right ventricular systolic function is normal. Tricuspid regurgitation signal is inadequate for assessing PA pressure. Left Atrium: Left atrial size was mild to moderately dilated. Right Atrium: Right atrial size was normal in size. Pericardium: There is no evidence of pericardial effusion. Mitral Valve: The mitral valve is normal in structure. No evidence of mitral valve regurgitation. MV peak gradient, 6.8 mmHg. The mean mitral valve gradient is 3.0 mmHg. Tricuspid Valve: The tricuspid valve is normal in structure. Tricuspid valve regurgitation is not demonstrated. Aortic Valve: The aortic valve is normal in structure. There is mild calcification of the aortic valve. Aortic valve regurgitation is mild. Aortic valve sclerosis is present,  with no evidence of aortic valve stenosis. Aortic valve mean gradient measures 5.3 mmHg. Aortic valve peak gradient measures 10.8 mmHg. Aortic valve area, by VTI measures 3.00 cm. Pulmonic Valve: The pulmonic valve was not well visualized. Pulmonic valve regurgitation is not visualized. Aorta: The aortic root was not well visualized. Venous: The inferior vena cava was not well visualized. IAS/Shunts: No atrial level shunt detected by color flow Doppler.  LEFT VENTRICLE PLAX 2D LVIDd:         3.50 cm      Diastology LVIDs:         2.30 cm      LV e' medial:    10.00 cm/s LV PW:         1.20 cm      LV E/e' medial:  10.8 LV IVS:        1.10 cm      LV e' lateral:   12.80 cm/s LVOT diam:     1.92 cm      LV E/e' lateral: 8.4 LV SV:         71 LV SV Index:   37 LVOT Area:     2.90 cm  LV Volumes (MOD) LV vol d, MOD A2C: 168.0 ml LV vol d, MOD A4C: 168.0 ml LV vol s, MOD A2C: 53.2 ml LV vol s, MOD A4C: 65.0 ml LV SV MOD A2C:     114.8 ml LV SV MOD A4C:     168.0 ml LV SV MOD BP:      108.2 ml RIGHT VENTRICLE RV Basal diam:  3.00 cm RV Mid diam:    2.50 cm RV S prime:     16.60 cm/s TAPSE (M-mode): 2.0 cm LEFT ATRIUM             Index        RIGHT ATRIUM           Index LA diam:        3.60 cm 1.87 cm/m   RA Area:     10.60 cm LA Vol (A2C):   61.2 ml 31.84 ml/m  RA Volume:   18.40 ml  9.57 ml/m LA Vol (A4C):  99.9 ml 51.98 ml/m LA Biplane Vol: 85.3 ml 44.38 ml/m  AORTIC VALVE                     PULMONIC VALVE AV Area (Vmax):    3.39 cm      PV Vmax:       1.42 m/s AV Area (Vmean):   2.97 cm      PV Peak grad:  8.1 mmHg AV Area (VTI):     3.00 cm AV Vmax:           164.00 cm/s AV Vmean:          101.033 cm/s AV VTI:            0.235 m AV Peak Grad:      10.8 mmHg AV Mean Grad:      5.3 mmHg LVOT Vmax:         192.00 cm/s LVOT Vmean:        103.500 cm/s LVOT VTI:          0.244 m LVOT/AV VTI ratio: 1.04  AORTA Ao Root diam: 2.70 cm Ao Asc diam:  2.60 cm MITRAL VALVE MV Area (PHT): 5.54 cm     SHUNTS MV Peak  grad:  6.8 mmHg     Systemic VTI:  0.24 m MV Mean grad:  3.0 mmHg     Systemic Diam: 1.92 cm MV Vmax:       1.30 m/s MV Vmean:      79.5 cm/s MV Decel Time: 137 msec MV E velocity: 108.00 cm/s Rachelle Hora Croitoru MD Electronically signed by Thurmon Fair MD Signature Date/Time: 06/01/2023/9:08:30 AM    Final    CT CERVICAL SPINE WO CONTRAST Result Date: 05/31/2023 CLINICAL DATA:  Polytrauma, blunt.  Multiple falls. EXAM: CT CERVICAL SPINE WITHOUT CONTRAST TECHNIQUE: Multidetector CT imaging of the cervical spine was performed without intravenous contrast. Multiplanar CT image reconstructions were also generated. RADIATION DOSE REDUCTION: This exam was performed according to the departmental dose-optimization program which includes automated exposure control, adjustment of the mA and/or kV according to patient size and/or use of iterative reconstruction technique. COMPARISON:  None Available. FINDINGS: Alignment: Normal Skull base and vertebrae: No acute fracture. No primary bone lesion or focal pathologic process. Soft tissues and spinal canal: No prevertebral fluid or swelling. No visible canal hematoma. Disc levels: Diffuse advanced degenerative disc disease with fusion across the C5-6 and C6-7 disc spaces. Advanced bilateral degenerative facet disease with fusion diffusely. Upper chest: No acute findings Other: None IMPRESSION: Diffuse advanced degenerative disc disease and facet disease. No acute bony abnormality. Electronically Signed   By: Charlett Nose M.D.   On: 05/31/2023 17:23   CT HEAD WO CONTRAST Result Date: 05/31/2023 CLINICAL DATA:  Head trauma, moderate-severe.  Multiple falls. EXAM: CT HEAD WITHOUT CONTRAST TECHNIQUE: Contiguous axial images were obtained from the base of the skull through the vertex without intravenous contrast. RADIATION DOSE REDUCTION: This exam was performed according to the departmental dose-optimization program which includes automated exposure control, adjustment of the mA  and/or kV according to patient size and/or use of iterative reconstruction technique. COMPARISON:  None Available. FINDINGS: Brain: There is atrophy and chronic small vessel disease changes. No acute intracranial abnormality. Specifically, no hemorrhage, hydrocephalus, mass lesion, acute infarction, or significant intracranial injury. Vascular: No hyperdense vessel or unexpected calcification. Skull: No acute calvarial abnormality. Sinuses/Orbits: No acute findings Other: None IMPRESSION: Atrophy, chronic microvascular disease. No acute intracranial abnormality. Electronically Signed  By: Charlett Nose M.D.   On: 05/31/2023 17:21   DG Pelvis Portable Result Date: 05/31/2023 CLINICAL DATA:  Multiple mechanical falls EXAM: PORTABLE PELVIS 1-2 VIEWS COMPARISON:  None Available. FINDINGS: Supine frontal view of the pelvis includes both hips. No acute displaced fracture, subluxation, or dislocation. Mild symmetrical bilateral hip osteoarthritis. Severe lower lumbar degenerative changes greatest at L4-5 and L5-S1. Soft tissues are unremarkable. IMPRESSION: 1. No acute pelvic fracture. 2. Degenerative changes of the lower lumbar spine and bilateral hips. Electronically Signed   By: Sharlet Salina M.D.   On: 05/31/2023 15:40   DG Chest Port 1 View Result Date: 05/31/2023 CLINICAL DATA:  Questionable sepsis EXAM: PORTABLE CHEST 1 VIEW COMPARISON:  Right shoulder x-ray 06/20/2011. FINDINGS: Single frontal view of the chest demonstrates an unremarkable cardiac silhouette. Chronic elevation of the right hemidiaphragm. No acute airspace disease, effusion, or pneumothorax. No acute bony abnormalities. IMPRESSION: 1. No acute intrathoracic process. Electronically Signed   By: Sharlet Salina M.D.   On: 05/31/2023 15:39    Labs: BNP (last 3 results) No results for input(s): "BNP" in the last 8760 hours. Basic Metabolic Panel: Recent Labs  Lab 06/04/23 0348 06/04/23 1412 06/05/23 0246 06/06/23 0244 06/07/23 0254  06/08/23 0305  NA 133* 137 136 141 141 141  K 3.9 3.7 3.8 3.5 3.4* 3.4*  CL 103 103 107 111 111 111  CO2 20*  --  22 24 23 24   GLUCOSE 105* 84 130* 132* 115* 109*  BUN 87* 86* 73* 60* 49* 35*  CREATININE 1.20 1.70* 1.41* 1.36* 1.18 1.19  CALCIUM 9.1  --  8.9 8.5* 8.4* 8.6*  CBC: Recent Labs  Lab 06/02/23 0445 06/03/23 0104 06/04/23 0348 06/04/23 1155 06/04/23 1412 06/04/23 1923 06/05/23 0246 06/06/23 0244  WBC 13.8* 18.3* 11.6*  --   --   --  10.9* 10.8*  HGB 10.3* 9.8* 8.0* 8.8* 9.5* 7.7* 7.5* 8.0*  HCT 31.5* 31.0* 26.1* 28.8* 28.0* 24.9* 23.7* 25.0*  MCV 96.6 101.0* 102.4*  --   --   --  100.4* 97.7  PLT 132* 171 141*  --   --   --  145* 143*  No results for input(s): "VITAMINB12", "FOLATE", "FERRITIN", "TIBC", "IRON", "RETICCTPCT" in the last 72 hours. Urinalysis    Component Value Date/Time   COLORURINE YELLOW 06/05/2023 1004   APPEARANCEUR CLEAR 06/05/2023 1004   LABSPEC 1.015 06/05/2023 1004   PHURINE 5.0 06/05/2023 1004   GLUCOSEU NEGATIVE 06/05/2023 1004   HGBUR NEGATIVE 06/05/2023 1004   BILIRUBINUR NEGATIVE 06/05/2023 1004   KETONESUR NEGATIVE 06/05/2023 1004   PROTEINUR 100 (A) 06/05/2023 1004   UROBILINOGEN 0.2 05/13/2007 0910   NITRITE NEGATIVE 06/05/2023 1004   LEUKOCYTESUR NEGATIVE 06/05/2023 1004   Sepsis Labs Recent Labs  Lab 06/03/23 0104 06/04/23 0348 06/05/23 0246 06/06/23 0244  WBC 18.3* 11.6* 10.9* 10.8*   Microbiology Recent Results (from the past 240 hours)  Blood Culture (routine x 2)     Status: None   Collection Time: 05/31/23  2:10 PM   Specimen: BLOOD LEFT ARM  Result Value Ref Range Status   Specimen Description   Final    BLOOD LEFT ARM Performed at [facility], [address] [place], [place] [address]    Special Requests   Final    BOTTLES DRAWN AEROBIC AND ANAEROBIC Blood Culture results may not be optimal due to an inadequate volume of blood received in culture bottles Performed at [facility], [address] [place], [place] [address]    Culture   Final    NO GROWTH 5 DAYS Performed at [facility], [address], [place], [place] [address]    Report Status 06/05/2023 FINAL  Final  Blood Culture (routine x 2)     Status: None   Collection Time: 05/31/23  2:12 PM   Specimen: BLOOD  Result Value Ref Range Status   Specimen Description   Final    BLOOD LEFT ANTECUBITAL Performed at [facility], [address], [place], [place] [address]    Special Requests   Final    BOTTLES DRAWN AEROBIC ONLY Blood Culture results may not be optimal due to an inadequate volume of blood received in culture bottles Performed at [facility], [address], [place], [place] [address]    Culture   Final    NO GROWTH 5 DAYS Performed at [facility], [address], [place], [place] [address]    Report Status 06/05/2023 FINAL  Final  Resp panel by RT-PCR (RSV, Flu A&B, Covid) Anterior Nasal Swab     Status: Abnormal   Collection Time: 05/31/23  2:28 PM   Specimen: Anterior Nasal Swab  Result Value Ref Range Status   SARS Coronavirus 2 by RT PCR NEGATIVE NEGATIVE Final    Comment: (NOTE) SARS-CoV-2 target nucleic acids are NOT DETECTED.  The SARS-CoV-2 RNA is generally detectable in upper respiratory specimens during the acute phase of infection. The lowest concentration of SARS-CoV-2 viral copies this assay can detect is 138 copies/mL. A negative result does not preclude SARS-Cov-2 infection and should not be used as the sole basis for treatment or other patient management decisions. A negative result may occur with  improper specimen collection/handling, submission of specimen other than nasopharyngeal swab, presence of viral mutation(s) within the areas targeted by this assay, and inadequate number of viral copies(<138 copies/mL). A negative result must be combined with clinical observations, patient history, and  epidemiological information. The expected result is Negative.  Fact Sheet for Patients:  BloggerCourse.com  Fact Sheet for Healthcare Providers:  SeriousBroker.it  This test is no t yet approved or cleared by the [place] FDA and  has been authorized for detection and/or diagnosis of SARS-CoV-2 by FDA under an Emergency Use Authorization (EUA). This EUA will remain  in effect (meaning this test can be used) for the duration of the COVID-19 declaration under Section 564(b)(1) of the Act, 21 U.S.C.section 360bbb-3(b)(1), unless the authorization is terminated  or revoked sooner.       Influenza A by PCR POSITIVE (A) NEGATIVE Final   Influenza B by PCR NEGATIVE NEGATIVE Final    Comment: (NOTE) The Xpert Xpress SARS-CoV-2/FLU/RSV plus assay is intended as an aid in the diagnosis of influenza from Nasopharyngeal swab specimens and should not be used as a sole basis for treatment. Nasal washings and aspirates are unacceptable for Xpert Xpress SARS-CoV-2/FLU/RSV testing.  Fact Sheet for Patients: BloggerCourse.com  Fact Sheet for Healthcare Providers: SeriousBroker.it  This test is not yet approved or cleared by the [place] FDA and has been authorized for detection and/or diagnosis of SARS-CoV-2 by FDA under an Emergency Use Authorization (EUA). This EUA will remain in effect (meaning this test can be used) for the duration of the COVID-19 declaration under Section 564(b)(1) of the Act, 21 U.S.C. section 360bbb-3(b)(1), unless the authorization is terminated or revoked.     Resp Syncytial Virus by PCR NEGATIVE NEGATIVE Final  Comment: (NOTE) Fact Sheet for Patients: BloggerCourse.com  Fact Sheet for Healthcare Providers: SeriousBroker.it  This test is not yet approved or cleared by the [place] FDA and has  been authorized for detection and/or diagnosis of SARS-CoV-2 by FDA under an Emergency Use Authorization (EUA). This EUA will remain in effect (meaning this test can be used) for the duration of the COVID-19 declaration under Section 564(b)(1) of the Act, 21 U.S.C. section 360bbb-3(b)(1), unless the authorization is terminated or revoked.  Performed at [facility], [address], [place], [place] [address]   MRSA Next Gen by PCR, Nasal     Status: None   Collection Time: 06/04/23  6:30 PM   Specimen: Nasal Mucosa; Nasal Swab  Result Value Ref Range Status   MRSA by PCR Next Gen NOT DETECTED NOT DETECTED Final    Comment: (NOTE) The GeneXpert MRSA Assay (FDA approved for NASAL specimens only), is one component of a comprehensive MRSA colonization surveillance program. It is not intended to diagnose MRSA infection nor to guide or monitor treatment for MRSA infections. Test performance is not FDA approved in patients less than 42 years old. Performed at [facility], [address], [place], [place] [address]    Time coordinating discharge:  35  minutes  SIGNED: Lanae Boast, MD  [facility] 06/08/2023, 1:34 PM  If 7PM-7AM, please contact night-coverage [email or web address]

## 2023-06-08 NOTE — Plan of Care (Signed)
  Problem: Clinical Measurements: Goal: Ability to maintain clinical measurements within normal limits will improve Outcome: Not Progressing Goal: Respiratory complications will improve Outcome: Not Progressing Goal: Cardiovascular complication will be avoided Outcome: Not Progressing

## 2023-06-08 NOTE — Progress Notes (Signed)
 RUE venous duplex has been completed.   Results can be found under chart review under CV PROC. 06/08/2023 10:49 AM Rosealee Recinos RVT, RDMS

## 2023-06-08 NOTE — Progress Notes (Signed)
 Report called to [facility] 207 to [facility]. All questions answered. PIV removed.   Upon PT[facility] arrival patient BP elevated. Manual checked 172/58. Kc MD aware and confirmed discharge is still appropriate as hydrochlorothiazide is being restarted for BP at facility.   Son, Mardelle Matte, called and notified of discharge and room assignment at facility.  Belongings including clothes, shoes, glasses and hat sent with patient via PTAR.

## 2023-06-08 NOTE — TOC Transition Note (Signed)
 Transition of Care [facility]) - Discharge Note  Patient Details  Name: [name] MRN: [id number] Date of Birth: Mar 19, 1930  Transition of [facility]) CM/SW Contact:  Ewing Schlein, LCSW Phone Number: 06/08/2023, 2:21 PM  Clinical Narrative: Patient is medically stable to discharge to SNF and has remained out of mittens. CSW received call from Tiffany with [facility] that she saw the patient in the hospital this morning and they are ready to admit him today. Patient will go to room 207 and the number for report is [phone]. Discharge summary, discharge orders, and SNF transfer report faxed to facility in hub. Medical necessity form done; PTAR scheduled. Discharge packet completed. CSW notified son, Shady Bradish, of discharge and transportation being set up. RN updated. TOC signing off.  Final next level of care: Skilled Nursing Facility Barriers to Discharge: Barriers Resolved  Patient Goals and CMS Choice Patient states their goals for this hospitalization and ongoing recovery are:: snf to get stonger CMS Medicare.gov Compare Post Acute Care list provided to:: Patient Represenative (must comment) Earnest Rosier (son)) Choice offered to / list presented to : Adult Children  Discharge Placement PASRR number recieved: 06/02/23       Patient chooses bed at: [facility] Patient to be transferred to facility by: PTAR Name of family member notified: Jahrell Hamor (son) Patient and family notified of of transfer: 06/08/23  Discharge Plan and Services Additional resources added to the After Visit Summary for         DME Arranged: N/A DME Agency: NA  Social Drivers of Health (SDOH) Interventions SDOH Screenings   Food Insecurity: No Food Insecurity (06/01/2023)  Housing: Low Risk  (06/01/2023)  Transportation Needs: No Transportation Needs (06/01/2023)  Utilities: Not At Risk (06/01/2023)  Social Connections: Unknown (06/01/2023)  Tobacco Use: Medium Risk (06/04/2023)   Readmission  Risk Interventions     No data to display

## 2023-06-09 ENCOUNTER — Encounter (HOSPITAL_COMMUNITY)

## 2023-06-09 DIAGNOSIS — E785 Hyperlipidemia, unspecified: Secondary | ICD-10-CM | POA: Diagnosis not present

## 2023-06-09 DIAGNOSIS — I10 Essential (primary) hypertension: Secondary | ICD-10-CM | POA: Diagnosis not present

## 2023-06-10 DIAGNOSIS — E785 Hyperlipidemia, unspecified: Secondary | ICD-10-CM | POA: Diagnosis not present

## 2023-06-10 DIAGNOSIS — I10 Essential (primary) hypertension: Secondary | ICD-10-CM | POA: Diagnosis not present

## 2023-06-11 DIAGNOSIS — I10 Essential (primary) hypertension: Secondary | ICD-10-CM | POA: Diagnosis not present

## 2023-06-11 DIAGNOSIS — E785 Hyperlipidemia, unspecified: Secondary | ICD-10-CM | POA: Diagnosis not present

## 2023-06-12 DIAGNOSIS — I10 Essential (primary) hypertension: Secondary | ICD-10-CM | POA: Diagnosis not present

## 2023-06-12 DIAGNOSIS — E785 Hyperlipidemia, unspecified: Secondary | ICD-10-CM | POA: Diagnosis not present

## 2023-06-12 NOTE — Progress Notes (Deleted)
 Cardiology Office Note:    Date:  06/12/2023   ID:  [name], DOB 1930/06/27, MRN [id number]  PCP:  Tisovec, Richard W, MD  Cardiologist:  Eilleen Grates, MD { Click to update primary MD,subspecialty MD or APP then REFRESH:1}    Referring MD: Tisovec, Richard W, MD   Chief Complaint: hospital follow-up of atrial fibrillation  History of Present Illness:    [name] is a 89 y.o. male with a history of CAD with known occluded RCA and DES to RCA in 2012, persistent atrial fibrillation not on anticoagulation due to frequent falls, hypertension, hyperlipidemia, CKD stage III, GERD, duodenal ulcers, anemia,  and BPH who is followed by Dr. Lavonne Prairie who presents today for hospital follow-up of atrial fibrillation.  Patient has a long history of CAD with remote cardiac catheterization in the 1980s revealing an occluded RCA.  He was medically managed at that time.  Last cardiac catheterization in 12/2010 showed 30% stenosis of distal left main, 30% stenosis of proximal to mid LAD, 95% stenosis of OM, and known occluded RCA with collaterals from the AV branches to the PDA.  He underwent successful PCI with DES to OM.  EF at that time was 65%.  He was last seen in 05/2019 by Humphrey Magnuson, PA-C, for a telehealth visit at which time he was doing well from a cardiac standpoint but his activity was limited due to arthritis in his knees.  He was recently admitted from 05/31/2023 to 06/08/2023 after presenting with a fall.  He tested positive for influenza A and was found to be in new onset rate controlled atrial fibrillation.  High-sensitivity troponin was mildly elevated and flat peaking at 135 which was felt to be due to demand ischemia from acute illness.  Echo showed LVEF of 70-75% with increased flow across the LV outflow tract due to high per dynamic contractility but no regional wall motion abnormalities.  He was started on Tamiflu and steroids.  He was initially started on IV heparin but then developed  coffee-ground emesis. He received 1 unit of PRBCs during admission. EGD showed multiple superficial duodenal ulcers.  He was ultimately felt to be at high risk for anticoagulation due to recent due to frequent falls.  He was initially started on Lopressor but then was noted to have a 4-second pause and bradycardia with rates in the 30s.  Lopressor was stopped and he had no recurrent pauses or significant bradycardia. Hospitalization was also complicated but acute bronchitis from influenza, acute metabolic encephalopathy, hypertensive urgency, and AKI.   Patient presents today for follow-up. ***  Persistent Atrial Fibrillation Diagnosed with atrial fibrillation during recent admission for influenza. Echo showed LVEF of 70-75% with no regional wall motions abnormalities. He was started on Lopressor but had a 4 second pause and bradycardia with rates in the 30s with this so it was stopped with no recurrence. He was not placed on anticoagulation due to frequent falls. - Remains in atrial fibrillation. Rate in the ***.  - Continue to avoid AV nodal agents given significant bradycardia with beta-blocker during recent admission. - CHA2DS2-VASc = 4 (CAD, HTN, age x2). Not felt to be a candidate for anticoagulation due to frequent falls.  - No plans to restore sinus rhythm given unable to anticoagulate. Continue rate control strategy.  CAD He has long history of CAD with known occluded RCA dating back to the 1980s and then DES to OM in 2012.  - No chest pain.  - Continue aspirin and statin.  Hypertension BP *** - Current medications: Lisinopril 40mg  daily, HCTZ 25mg  daily, and Hydralazine 50mg  three times daily.   Hyperlipidemia Lipid panel on 06/01/2023: Total Cholesterol 107, Triglycerides 49, HDL 45, LDL 52. LDL goal <70 given CAD. - Simvastatin was stopped during recent admission and he was started on Lipitor 40mg  daily.  - Will repeat lipid panel and LFTs in 6-8 weeks. ***  CKD Stage  IIIa Baseline creatinine around 1. Creatinine peaked at 1.7 during recent admission but improved to 1.19 on discharge.  - Will repeat BMET. ***  Anemia Duodenal Ulcers During recent admission, he was noted to have coffee ground emesis after being started on IV Heparin. Hemoglobin dropped as low as 7.5 and he was treated with 1 unit of PRBCs. EGD showed multiple superficial duodenal ulcers.  - Continue PPI.  - Will repeat CBC. ***   EKGs/Labs/Other Studies Reviewed:    The following studies were reviewed:  Echocardiogram 06/01/2023: Impressions: 1. There is increased flow velocity across the LV outflow tract, due to  hyperdynamic left ventricular contractility. There does not appear to be  true dynamic LV outflow obstruction. Left ventricular ejection fraction,  by estimation, is 70 to 75%. The  left ventricle has hyperdynamic function. The left ventricle has no  regional wall motion abnormalities. Left ventricular diastolic function  could not be evaluated.   2. Right ventricular systolic function is normal. The right ventricular  size is normal. Tricuspid regurgitation signal is inadequate for assessing  PA pressure.   3. Left atrial size was mild to moderately dilated.   4. The mitral valve is normal in structure. No evidence of mitral valve  regurgitation.   5. The aortic valve is normal in structure. There is mild calcification  of the aortic valve. Aortic valve regurgitation is mild. Aortic valve  sclerosis is present, with no evidence of aortic valve stenosis.    EKG:  EKG ordered today. EKG personally reviewed and demonstrates ***.  Recent Labs: 05/31/2023: Magnesium 1.7 06/01/2023: ALT 19; TSH 0.297 06/06/2023: Platelets 143 06/08/2023: BUN 35; Creatinine, Ser 1.19; Hemoglobin 8.6; Potassium 3.4; Sodium 141  Recent Lipid Panel    Component Value Date/Time   CHOL 107 06/01/2023 0409   TRIG 49 06/01/2023 0409   HDL 45 06/01/2023 0409   CHOLHDL 2.4 06/01/2023 0409   VLDL  10 06/01/2023 0409   LDLCALC 52 06/01/2023 0409    Physical Exam:    Vital Signs: There were no vitals taken for this visit.    Wt Readings from Last 3 Encounters:  06/04/23 175 lb 0.7 oz (79.4 kg)  04/28/18 185 lb 9.6 oz (84.2 kg)  04/13/17 180 lb (81.6 kg)     General: 89 y.o. male in no acute distress. HEENT: Normocephalic and atraumatic. Sclera clear.  Neck: Supple. No carotid bruits. No JVD. Heart: *** RRR. Distinct S1 and S2. No murmurs, gallops, or rubs.  Lungs: No increased work of breathing. Clear to ausculation bilaterally. No wheezes, rhonchi, or rales.  Abdomen: Soft, non-distended, and non-tender to palpation.  Extremities: No lower extremity edema.  Radial and distal pedal pulses 2+ and equal bilaterally. Skin: Warm and dry. Neuro: No focal deficits. Psych: Normal affect. Responds appropriately.   Assessment:    No diagnosis found.  Plan:     Disposition: Follow up in ***   Signed, Casimer Clear, PA-C  06/12/2023 8:51 AM    [facility]

## 2023-06-14 DIAGNOSIS — E785 Hyperlipidemia, unspecified: Secondary | ICD-10-CM | POA: Diagnosis not present

## 2023-06-14 DIAGNOSIS — I10 Essential (primary) hypertension: Secondary | ICD-10-CM | POA: Diagnosis not present

## 2023-06-16 DIAGNOSIS — I10 Essential (primary) hypertension: Secondary | ICD-10-CM | POA: Diagnosis not present

## 2023-06-16 DIAGNOSIS — E785 Hyperlipidemia, unspecified: Secondary | ICD-10-CM | POA: Diagnosis not present

## 2023-06-17 DIAGNOSIS — I10 Essential (primary) hypertension: Secondary | ICD-10-CM | POA: Diagnosis not present

## 2023-06-17 DIAGNOSIS — E785 Hyperlipidemia, unspecified: Secondary | ICD-10-CM | POA: Diagnosis not present

## 2023-06-18 DIAGNOSIS — I10 Essential (primary) hypertension: Secondary | ICD-10-CM | POA: Diagnosis not present

## 2023-06-18 DIAGNOSIS — E785 Hyperlipidemia, unspecified: Secondary | ICD-10-CM | POA: Diagnosis not present

## 2023-06-20 DIAGNOSIS — I10 Essential (primary) hypertension: Secondary | ICD-10-CM | POA: Diagnosis not present

## 2023-06-20 DIAGNOSIS — E785 Hyperlipidemia, unspecified: Secondary | ICD-10-CM | POA: Diagnosis not present

## 2023-06-21 DIAGNOSIS — E785 Hyperlipidemia, unspecified: Secondary | ICD-10-CM | POA: Diagnosis not present

## 2023-06-21 DIAGNOSIS — I10 Essential (primary) hypertension: Secondary | ICD-10-CM | POA: Diagnosis not present

## 2023-06-22 ENCOUNTER — Ambulatory Visit: Attending: Student | Admitting: Student

## 2023-06-23 DIAGNOSIS — E785 Hyperlipidemia, unspecified: Secondary | ICD-10-CM | POA: Diagnosis not present

## 2023-06-23 DIAGNOSIS — I10 Essential (primary) hypertension: Secondary | ICD-10-CM | POA: Diagnosis not present

## 2023-06-24 DIAGNOSIS — I10 Essential (primary) hypertension: Secondary | ICD-10-CM | POA: Diagnosis not present

## 2023-06-24 DIAGNOSIS — E785 Hyperlipidemia, unspecified: Secondary | ICD-10-CM | POA: Diagnosis not present

## 2023-06-25 DIAGNOSIS — E785 Hyperlipidemia, unspecified: Secondary | ICD-10-CM | POA: Diagnosis not present

## 2023-06-25 DIAGNOSIS — I10 Essential (primary) hypertension: Secondary | ICD-10-CM | POA: Diagnosis not present

## 2023-06-27 DIAGNOSIS — E785 Hyperlipidemia, unspecified: Secondary | ICD-10-CM | POA: Diagnosis not present

## 2023-06-27 DIAGNOSIS — I10 Essential (primary) hypertension: Secondary | ICD-10-CM | POA: Diagnosis not present

## 2023-06-28 DIAGNOSIS — R262 Difficulty in walking, not elsewhere classified: Secondary | ICD-10-CM | POA: Diagnosis not present

## 2023-06-28 DIAGNOSIS — G9341 Metabolic encephalopathy: Secondary | ICD-10-CM | POA: Diagnosis not present

## 2023-06-28 DIAGNOSIS — I10 Essential (primary) hypertension: Secondary | ICD-10-CM | POA: Diagnosis not present

## 2023-06-28 DIAGNOSIS — R41841 Cognitive communication deficit: Secondary | ICD-10-CM | POA: Diagnosis not present

## 2023-06-28 DIAGNOSIS — M6281 Muscle weakness (generalized): Secondary | ICD-10-CM | POA: Diagnosis not present

## 2023-06-28 DIAGNOSIS — E785 Hyperlipidemia, unspecified: Secondary | ICD-10-CM | POA: Diagnosis not present

## 2023-06-29 DIAGNOSIS — R41841 Cognitive communication deficit: Secondary | ICD-10-CM | POA: Diagnosis not present

## 2023-06-29 DIAGNOSIS — R262 Difficulty in walking, not elsewhere classified: Secondary | ICD-10-CM | POA: Diagnosis not present

## 2023-06-29 DIAGNOSIS — M6281 Muscle weakness (generalized): Secondary | ICD-10-CM | POA: Diagnosis not present

## 2023-06-29 DIAGNOSIS — I10 Essential (primary) hypertension: Secondary | ICD-10-CM | POA: Diagnosis not present

## 2023-06-29 DIAGNOSIS — E785 Hyperlipidemia, unspecified: Secondary | ICD-10-CM | POA: Diagnosis not present

## 2023-06-30 DIAGNOSIS — M6281 Muscle weakness (generalized): Secondary | ICD-10-CM | POA: Diagnosis not present

## 2023-06-30 DIAGNOSIS — R262 Difficulty in walking, not elsewhere classified: Secondary | ICD-10-CM | POA: Diagnosis not present

## 2023-06-30 DIAGNOSIS — R41841 Cognitive communication deficit: Secondary | ICD-10-CM | POA: Diagnosis not present

## 2023-06-30 DIAGNOSIS — G9341 Metabolic encephalopathy: Secondary | ICD-10-CM | POA: Diagnosis not present

## 2023-07-01 ENCOUNTER — Ambulatory Visit: Admitting: Emergency Medicine

## 2023-07-01 DIAGNOSIS — R262 Difficulty in walking, not elsewhere classified: Secondary | ICD-10-CM | POA: Diagnosis not present

## 2023-07-01 DIAGNOSIS — I10 Essential (primary) hypertension: Secondary | ICD-10-CM | POA: Diagnosis not present

## 2023-07-01 DIAGNOSIS — R41841 Cognitive communication deficit: Secondary | ICD-10-CM | POA: Diagnosis not present

## 2023-07-01 DIAGNOSIS — M6281 Muscle weakness (generalized): Secondary | ICD-10-CM | POA: Diagnosis not present

## 2023-07-01 DIAGNOSIS — J101 Influenza due to other identified influenza virus with other respiratory manifestations: Secondary | ICD-10-CM | POA: Diagnosis not present

## 2023-07-01 DIAGNOSIS — E785 Hyperlipidemia, unspecified: Secondary | ICD-10-CM | POA: Diagnosis not present

## 2023-07-01 DIAGNOSIS — G9341 Metabolic encephalopathy: Secondary | ICD-10-CM | POA: Diagnosis not present

## 2023-07-01 DIAGNOSIS — J209 Acute bronchitis, unspecified: Secondary | ICD-10-CM | POA: Diagnosis not present

## 2023-07-01 DIAGNOSIS — I2510 Atherosclerotic heart disease of native coronary artery without angina pectoris: Secondary | ICD-10-CM | POA: Diagnosis not present

## 2023-07-02 DIAGNOSIS — E785 Hyperlipidemia, unspecified: Secondary | ICD-10-CM | POA: Diagnosis not present

## 2023-07-02 DIAGNOSIS — I10 Essential (primary) hypertension: Secondary | ICD-10-CM | POA: Diagnosis not present

## 2023-07-08 ENCOUNTER — Encounter (HOSPITAL_COMMUNITY): Payer: Self-pay

## 2023-07-08 ENCOUNTER — Other Ambulatory Visit: Payer: Self-pay

## 2023-07-08 ENCOUNTER — Emergency Department (HOSPITAL_COMMUNITY)

## 2023-07-08 ENCOUNTER — Inpatient Hospital Stay (HOSPITAL_COMMUNITY)
Admission: EM | Admit: 2023-07-08 | Discharge: 2023-08-01 | DRG: 308 | Disposition: E | Attending: Internal Medicine | Admitting: Internal Medicine

## 2023-07-08 DIAGNOSIS — Z66 Do not resuscitate: Secondary | ICD-10-CM

## 2023-07-08 DIAGNOSIS — R609 Edema, unspecified: Secondary | ICD-10-CM | POA: Diagnosis not present

## 2023-07-08 DIAGNOSIS — I499 Cardiac arrhythmia, unspecified: Secondary | ICD-10-CM | POA: Diagnosis not present

## 2023-07-08 DIAGNOSIS — Z7189 Other specified counseling: Secondary | ICD-10-CM | POA: Diagnosis not present

## 2023-07-08 DIAGNOSIS — I2510 Atherosclerotic heart disease of native coronary artery without angina pectoris: Secondary | ICD-10-CM

## 2023-07-08 DIAGNOSIS — R001 Bradycardia, unspecified: Principal | ICD-10-CM

## 2023-07-08 DIAGNOSIS — R0902 Hypoxemia: Secondary | ICD-10-CM | POA: Diagnosis not present

## 2023-07-08 DIAGNOSIS — E785 Hyperlipidemia, unspecified: Secondary | ICD-10-CM | POA: Diagnosis present

## 2023-07-08 DIAGNOSIS — Z8711 Personal history of peptic ulcer disease: Secondary | ICD-10-CM

## 2023-07-08 DIAGNOSIS — R579 Shock, unspecified: Secondary | ICD-10-CM | POA: Diagnosis present

## 2023-07-08 DIAGNOSIS — Z87891 Personal history of nicotine dependence: Secondary | ICD-10-CM | POA: Diagnosis not present

## 2023-07-08 DIAGNOSIS — E611 Iron deficiency: Secondary | ICD-10-CM | POA: Diagnosis present

## 2023-07-08 DIAGNOSIS — E663 Overweight: Secondary | ICD-10-CM | POA: Diagnosis present

## 2023-07-08 DIAGNOSIS — Z79890 Hormone replacement therapy (postmenopausal): Secondary | ICD-10-CM

## 2023-07-08 DIAGNOSIS — Z955 Presence of coronary angioplasty implant and graft: Secondary | ICD-10-CM

## 2023-07-08 DIAGNOSIS — I160 Hypertensive urgency: Secondary | ICD-10-CM | POA: Diagnosis present

## 2023-07-08 DIAGNOSIS — N400 Enlarged prostate without lower urinary tract symptoms: Secondary | ICD-10-CM | POA: Diagnosis not present

## 2023-07-08 DIAGNOSIS — R0602 Shortness of breath: Secondary | ICD-10-CM | POA: Diagnosis not present

## 2023-07-08 DIAGNOSIS — Z7982 Long term (current) use of aspirin: Secondary | ICD-10-CM

## 2023-07-08 DIAGNOSIS — G9341 Metabolic encephalopathy: Secondary | ICD-10-CM | POA: Diagnosis not present

## 2023-07-08 DIAGNOSIS — R0600 Dyspnea, unspecified: Secondary | ICD-10-CM | POA: Diagnosis not present

## 2023-07-08 DIAGNOSIS — Z6826 Body mass index (BMI) 26.0-26.9, adult: Secondary | ICD-10-CM

## 2023-07-08 DIAGNOSIS — Z515 Encounter for palliative care: Secondary | ICD-10-CM

## 2023-07-08 DIAGNOSIS — E440 Moderate protein-calorie malnutrition: Secondary | ICD-10-CM | POA: Diagnosis not present

## 2023-07-08 DIAGNOSIS — E872 Acidosis, unspecified: Secondary | ICD-10-CM | POA: Diagnosis present

## 2023-07-08 DIAGNOSIS — I441 Atrioventricular block, second degree: Principal | ICD-10-CM

## 2023-07-08 DIAGNOSIS — E8809 Other disorders of plasma-protein metabolism, not elsewhere classified: Secondary | ICD-10-CM | POA: Diagnosis not present

## 2023-07-08 DIAGNOSIS — R54 Age-related physical debility: Secondary | ICD-10-CM | POA: Diagnosis present

## 2023-07-08 DIAGNOSIS — I509 Heart failure, unspecified: Secondary | ICD-10-CM | POA: Diagnosis present

## 2023-07-08 DIAGNOSIS — R570 Cardiogenic shock: Principal | ICD-10-CM

## 2023-07-08 DIAGNOSIS — D631 Anemia in chronic kidney disease: Secondary | ICD-10-CM | POA: Diagnosis not present

## 2023-07-08 DIAGNOSIS — R404 Transient alteration of awareness: Secondary | ICD-10-CM | POA: Diagnosis not present

## 2023-07-08 DIAGNOSIS — I462 Cardiac arrest due to underlying cardiac condition: Secondary | ICD-10-CM | POA: Diagnosis not present

## 2023-07-08 DIAGNOSIS — E876 Hypokalemia: Secondary | ICD-10-CM | POA: Diagnosis not present

## 2023-07-08 DIAGNOSIS — Z8042 Family history of malignant neoplasm of prostate: Secondary | ICD-10-CM

## 2023-07-08 DIAGNOSIS — I480 Paroxysmal atrial fibrillation: Secondary | ICD-10-CM | POA: Diagnosis not present

## 2023-07-08 DIAGNOSIS — R531 Weakness: Secondary | ICD-10-CM | POA: Diagnosis not present

## 2023-07-08 DIAGNOSIS — E874 Mixed disorder of acid-base balance: Secondary | ICD-10-CM | POA: Diagnosis present

## 2023-07-08 DIAGNOSIS — K922 Gastrointestinal hemorrhage, unspecified: Secondary | ICD-10-CM | POA: Diagnosis not present

## 2023-07-08 DIAGNOSIS — I130 Hypertensive heart and chronic kidney disease with heart failure and stage 1 through stage 4 chronic kidney disease, or unspecified chronic kidney disease: Secondary | ICD-10-CM | POA: Diagnosis not present

## 2023-07-08 DIAGNOSIS — L89152 Pressure ulcer of sacral region, stage 2: Secondary | ICD-10-CM | POA: Diagnosis present

## 2023-07-08 DIAGNOSIS — F419 Anxiety disorder, unspecified: Secondary | ICD-10-CM | POA: Diagnosis present

## 2023-07-08 DIAGNOSIS — D696 Thrombocytopenia, unspecified: Secondary | ICD-10-CM | POA: Diagnosis present

## 2023-07-08 DIAGNOSIS — Z79899 Other long term (current) drug therapy: Secondary | ICD-10-CM

## 2023-07-08 DIAGNOSIS — N1832 Chronic kidney disease, stage 3b: Secondary | ICD-10-CM | POA: Diagnosis not present

## 2023-07-08 DIAGNOSIS — N179 Acute kidney failure, unspecified: Secondary | ICD-10-CM | POA: Diagnosis present

## 2023-07-08 DIAGNOSIS — Z789 Other specified health status: Secondary | ICD-10-CM | POA: Diagnosis not present

## 2023-07-08 DIAGNOSIS — Z800 Family history of malignant neoplasm of digestive organs: Secondary | ICD-10-CM

## 2023-07-08 DIAGNOSIS — Z888 Allergy status to other drugs, medicaments and biological substances status: Secondary | ICD-10-CM

## 2023-07-08 DIAGNOSIS — Z6827 Body mass index (BMI) 27.0-27.9, adult: Secondary | ICD-10-CM

## 2023-07-08 DIAGNOSIS — R918 Other nonspecific abnormal finding of lung field: Secondary | ICD-10-CM | POA: Diagnosis not present

## 2023-07-08 DIAGNOSIS — R0989 Other specified symptoms and signs involving the circulatory and respiratory systems: Secondary | ICD-10-CM | POA: Diagnosis not present

## 2023-07-08 DIAGNOSIS — I10 Essential (primary) hypertension: Secondary | ICD-10-CM

## 2023-07-08 DIAGNOSIS — I700 Atherosclerosis of aorta: Secondary | ICD-10-CM | POA: Diagnosis not present

## 2023-07-08 LAB — COMPREHENSIVE METABOLIC PANEL WITH GFR
ALT: 25 U/L (ref 0–44)
AST: 45 U/L — ABNORMAL HIGH (ref 15–41)
Albumin: 2.5 g/dL — ABNORMAL LOW (ref 3.5–5.0)
Alkaline Phosphatase: 76 U/L (ref 38–126)
Anion gap: 12 (ref 5–15)
BUN: 69 mg/dL — ABNORMAL HIGH (ref 8–23)
CO2: 20 mmol/L — ABNORMAL LOW (ref 22–32)
Calcium: 8.8 mg/dL — ABNORMAL LOW (ref 8.9–10.3)
Chloride: 111 mmol/L (ref 98–111)
Creatinine, Ser: 2.81 mg/dL — ABNORMAL HIGH (ref 0.61–1.24)
GFR, Estimated: 21 mL/min — ABNORMAL LOW (ref >60)
Glucose, Bld: 94 mg/dL (ref 70–99)
Potassium: 3.9 mmol/L (ref 3.5–5.1)
Sodium: 143 mmol/L (ref 135–145)
Total Bilirubin: 0.6 mg/dL (ref 0.0–1.2)
Total Protein: 5.5 g/dL — ABNORMAL LOW (ref 6.5–8.1)

## 2023-07-08 LAB — CBC WITH DIFFERENTIAL/PLATELET
Abs Immature Granulocytes: 0.03 10*3/uL (ref 0.00–0.07)
Basophils Absolute: 0 10*3/uL (ref 0.0–0.1)
Basophils Relative: 0 %
Eosinophils Absolute: 0.2 10*3/uL (ref 0.0–0.5)
Eosinophils Relative: 3 %
HCT: 23.3 % — ABNORMAL LOW (ref 39.0–52.0)
Hemoglobin: 7.3 g/dL — ABNORMAL LOW (ref 13.0–17.0)
Immature Granulocytes: 0 %
Lymphocytes Relative: 13 %
Lymphs Abs: 1 10*3/uL (ref 0.7–4.0)
MCH: 30.8 pg (ref 26.0–34.0)
MCHC: 31.3 g/dL (ref 30.0–36.0)
MCV: 98.3 fL (ref 80.0–100.0)
Monocytes Absolute: 0.7 10*3/uL (ref 0.1–1.0)
Monocytes Relative: 10 %
Neutro Abs: 5.2 10*3/uL (ref 1.7–7.7)
Neutrophils Relative %: 74 %
Platelets: 134 10*3/uL — ABNORMAL LOW (ref 150–400)
RBC: 2.37 MIL/uL — ABNORMAL LOW (ref 4.22–5.81)
RDW: 16.8 % — ABNORMAL HIGH (ref 11.5–15.5)
WBC: 7.1 10*3/uL (ref 4.0–10.5)
nRBC: 0 % (ref 0.0–0.2)

## 2023-07-08 LAB — I-STAT ARTERIAL BLOOD GAS, ED
Acid-base deficit: 4 mmol/L — ABNORMAL HIGH (ref 0.0–2.0)
Bicarbonate: 22.2 mmol/L (ref 20.0–28.0)
Calcium, Ion: 1.29 mmol/L (ref 1.15–1.40)
HCT: 21 % — ABNORMAL LOW (ref 39.0–52.0)
Hemoglobin: 7.1 g/dL — ABNORMAL LOW (ref 13.0–17.0)
O2 Saturation: 93 %
Potassium: 3 mmol/L — ABNORMAL LOW (ref 3.5–5.1)
Sodium: 143 mmol/L (ref 135–145)
TCO2: 23 mmol/L (ref 22–32)
pCO2 arterial: 43.8 mmHg (ref 32–48)
pH, Arterial: 7.313 — ABNORMAL LOW (ref 7.35–7.45)
pO2, Arterial: 72 mmHg — ABNORMAL LOW (ref 83–108)

## 2023-07-08 LAB — MAGNESIUM: Magnesium: 1.8 mg/dL (ref 1.7–2.4)

## 2023-07-08 LAB — IRON AND TIBC
Iron: 38 ug/dL — ABNORMAL LOW (ref 45–182)
Saturation Ratios: 13 % — ABNORMAL LOW (ref 17.9–39.5)
TIBC: 290 ug/dL (ref 250–450)
UIBC: 252 ug/dL

## 2023-07-08 LAB — TROPONIN I (HIGH SENSITIVITY)
Troponin I (High Sensitivity): 12 ng/L (ref <18)
Troponin I (High Sensitivity): 12 ng/L (ref <18)

## 2023-07-08 LAB — BRAIN NATRIURETIC PEPTIDE: B Natriuretic Peptide: 204.4 pg/mL — ABNORMAL HIGH (ref 0.0–100.0)

## 2023-07-08 LAB — TSH: TSH: 3.629 u[IU]/mL (ref 0.350–4.500)

## 2023-07-08 LAB — GLUCOSE, CAPILLARY: Glucose-Capillary: 143 mg/dL — ABNORMAL HIGH (ref 70–99)

## 2023-07-08 LAB — T4, FREE: Free T4: 1.5 ng/dL — ABNORMAL HIGH (ref 0.61–1.12)

## 2023-07-08 LAB — LACTIC ACID, PLASMA: Lactic Acid, Venous: 2.3 mmol/L (ref 0.5–1.9)

## 2023-07-08 MED ORDER — BIOTENE DRY MOUTH MT LIQD: 15.0000 mL | OROMUCOSAL | Status: DC | PRN

## 2023-07-08 MED ORDER — DOCUSATE SODIUM 100 MG PO CAPS: 100.0000 mg | ORAL_CAPSULE | Freq: Two times a day (BID) | ORAL | Status: DC | PRN

## 2023-07-08 MED ORDER — NOREPINEPHRINE 4 MG/250ML-% IV SOLN
2.0000 ug/min | INTRAVENOUS | Status: DC
Start: 2023-07-08 — End: 2023-07-09
  Administered 2023-07-08: 9 ug/min via INTRAVENOUS
  Filled 2023-07-08: qty 250

## 2023-07-08 MED ORDER — HEPARIN SODIUM (PORCINE) 5000 UNIT/ML IJ SOLN
5000.0000 [IU] | Freq: Three times a day (TID) | INTRAMUSCULAR | Status: DC
  Administered 2023-07-08: 5000 [IU] via SUBCUTANEOUS
  Filled 2023-07-08: qty 1

## 2023-07-08 MED ORDER — HYDROMORPHONE HCL 1 MG/ML IJ SOLN: 0.5000 mg | INTRAMUSCULAR | Status: DC | PRN

## 2023-07-08 MED ORDER — PANTOPRAZOLE SODIUM 40 MG IV SOLR
40.0000 mg | Freq: Two times a day (BID) | INTRAVENOUS | Status: DC
  Administered 2023-07-08: 40 mg via INTRAVENOUS
  Filled 2023-07-08 (×2): qty 10

## 2023-07-08 MED ORDER — GLYCOPYRROLATE 0.2 MG/ML IJ SOLN
0.4000 mg | INTRAMUSCULAR | Status: DC | PRN
  Administered 2023-07-09: 0.4 mg via INTRAVENOUS
  Filled 2023-07-08: qty 2

## 2023-07-08 MED ORDER — ACETAMINOPHEN 650 MG RE SUPP: 650.0000 mg | Freq: Four times a day (QID) | RECTAL | Status: DC | PRN

## 2023-07-08 MED ORDER — SODIUM CHLORIDE 0.9 % IV SOLN: 250.0000 mL | INTRAVENOUS | Status: AC

## 2023-07-08 MED ORDER — ACETAMINOPHEN 325 MG PO TABS: 650.0000 mg | ORAL_TABLET | Freq: Four times a day (QID) | ORAL | Status: DC | PRN

## 2023-07-08 MED ORDER — FUROSEMIDE 10 MG/ML IJ SOLN
40.0000 mg | Freq: Four times a day (QID) | INTRAMUSCULAR | Status: DC
  Administered 2023-07-08: 40 mg via INTRAVENOUS
  Filled 2023-07-08: qty 4

## 2023-07-08 MED ORDER — ONDANSETRON HCL 4 MG/2ML IJ SOLN: 4.0000 mg | Freq: Four times a day (QID) | INTRAMUSCULAR | Status: DC | PRN

## 2023-07-08 MED ORDER — HYDROMORPHONE HCL 1 MG/ML IJ SOLN
0.5000 mg | INTRAMUSCULAR | Status: DC | PRN
  Administered 2023-07-09 – 2023-07-17 (×4): 1 mg via INTRAVENOUS
  Filled 2023-07-08 (×5): qty 1

## 2023-07-08 MED ORDER — ATROPINE SULFATE 1 MG/10ML IJ SOSY
1.0000 mg | PREFILLED_SYRINGE | Freq: Once | INTRAMUSCULAR | Status: AC
  Administered 2023-07-08: 1 mg via INTRAVENOUS

## 2023-07-08 MED ORDER — POTASSIUM CHLORIDE 10 MEQ/100ML IV SOLN
10.0000 meq | INTRAVENOUS | Status: AC
  Administered 2023-07-08 (×2): 10 meq via INTRAVENOUS
  Filled 2023-07-08 (×2): qty 100

## 2023-07-08 MED ORDER — LORAZEPAM 2 MG/ML IJ SOLN
1.0000 mg | INTRAMUSCULAR | Status: DC | PRN
  Administered 2023-07-08 – 2023-07-09 (×2): 1 mg via INTRAVENOUS
  Filled 2023-07-08 (×3): qty 1

## 2023-07-08 MED ORDER — ALBUMIN HUMAN 25 % IV SOLN
12.5000 g | Freq: Once | INTRAVENOUS | Status: AC
  Administered 2023-07-08: 12.5 g via INTRAVENOUS
  Filled 2023-07-08: qty 50

## 2023-07-08 MED ORDER — POLYETHYLENE GLYCOL 3350 17 G PO PACK: 17.0000 g | PACK | Freq: Every day | ORAL | Status: DC | PRN

## 2023-07-08 MED ORDER — NOREPINEPHRINE 4 MG/250ML-% IV SOLN
0.0000 ug/min | INTRAVENOUS | Status: DC
  Administered 2023-07-08: 10 ug/min via INTRAVENOUS

## 2023-07-08 MED ORDER — ONDANSETRON 4 MG PO TBDP: 4.0000 mg | ORAL_TABLET | Freq: Four times a day (QID) | ORAL | Status: DC | PRN

## 2023-07-08 MED ORDER — EPINEPHRINE HCL 5 MG/250ML IV SOLN IN NS
0.5000 ug/min | INTRAVENOUS | Status: DC
  Administered 2023-07-08: 5 ug/min via INTRAVENOUS
  Administered 2023-07-08: 14 ug/min via INTRAVENOUS
  Filled 2023-07-08: qty 250

## 2023-07-08 MED ORDER — POLYVINYL ALCOHOL 1.4 % OP SOLN: 1.0000 [drp] | Freq: Four times a day (QID) | OPHTHALMIC | Status: DC | PRN

## 2023-07-08 NOTE — Consult Note (Addendum)
 Cardiology Consultation   Patient ID: [name] MRN: [id number]; DOB: 27-Apr-1930  Admit date: 07/08/2023 Date of Consult: 07/08/2023  PCP:  Suzzanne Estrin, MD   [facility] Providers Cardiologist:  Eilleen Grates, MD   {  Patient Profile:   [name] is a 89 y.o. male with a hx of paroxysmal atrial fibrillation not on anticoagulation due to high bleeding risk, frequent falls, CAD with CTO of RCA DES to OM 2022 hyperlipidemia, BPH, DES who is being seen 07/08/2023 for the evaluation of bradycardia at the request of Dr. Garrett Kallman.  History of Present Illness:   [name] has recent admission in April 2025 where he was admitted for new onset atrial fibrillation, influenza A, coffee-ground emesis, hypertensive urgency.  We had seen patient and collectively decided with his son [name] that he was not a good candidate for anticoagulation.  Pt had 4.3-second pause during this admission so metoprolol  discontinued.  Patient also had coffee-ground emesis, underwent EGD that showed multiple superficial duodenal ulcers.  Did require 1 unit PRBC.  Echocardiogram with hyperdynamic function this admission.  Currently now patient being evaluated for bradycardia with heart rates as low as 20s to 30s.  Reportedly lives at nursing facility.  He had been noting more shortness of breath and lethargy. On arrival to ER pt hypotensive with systolics pressures  in the 70s and low MAPS.  He was started on Levophed.  At some point HR went down into the 20s and became very lethargic and mildly arousable but unresponsive to painful stimuli  Atropine given and pt started on epi and levophed.    Systolics now 100-110.  Heart rates 60s to 70s.(SR, Wenkebach)  At the bedside Dr. Avanell Bob and myself are present, POCUS shows normal LVEF and RVEF   EKG with second-degree type I AV block, 2-1 heart block.  ABG showed pH of 6.8 mixed respiratory and metabolic acidosis.   Potassium 2.9, creatinine 2.8 up from baseline.   Albumin 2.5.  BNP 204.  Hemoglobin 7.3.  No obvious signs of bleeding.  Platelets 134.  Chest x-ray left basilar opacity, possible trace versus small left pleural effusion.  Multiple labs are pending.  CO2 20. Trop 12  Past Medical History:  Diagnosis Date   Acute GI bleeding 10/30/2011   Anginal pain (HCC) 12/2010   Arthritis    "knees and left shoulder is completely shot"   BPH (benign prostatic hyperplasia)    CAD (coronary artery disease)    RCA occluded (Cath [place] 1980s);  LHC 12/22/10: dLM 30%, prox to mid LAD 30%, oOM 95%, AV branches supplied collats to PDA, RCA occluded, EF 65%.  He underwent placement of a Promus DES to the OM.   Carotid stenosis    0 - 39% bilateral   Diverticulosis    Exertional dyspnea 10/30/2011   GERD (gastroesophageal reflux disease)    Glaucoma    Gout    Hyperlipidemia    Hypertension    Pneumonia 1950's    Past Surgical History:  Procedure Laterality Date   BIOPSY OF SKIN SUBCUTANEOUS TISSUE AND/OR MUCOUS MEMBRANE  06/04/2023   Procedure: BIOPSY, SKIN, SUBCUTANEOUS TISSUE, OR MUCOUS MEMBRANE;  Surgeon: Tobin Forts, MD;  Location: [facility];  Service: Gastroenterology;;   COLONOSCOPY     CORONARY ANGIOPLASTY WITH STENT PLACEMENT  12/2010   "1"   CYSTECTOMY     right cheek   ESOPHAGOGASTRODUODENOSCOPY N/A 06/04/2023   Procedure: EGD (ESOPHAGOGASTRODUODENOSCOPY);  Surgeon: Tobin Forts, MD;  Location: [facility];  Service: Gastroenterology;  Laterality: N/A;   EYE MUSCLE SURGERY  1953   right   TRANSURETHRAL RESECTION OF PROSTATE  ~ 2007    Inpatient Medications: Scheduled Meds:  Continuous Infusions:  epinephrine 8 mcg/min (07/08/23 1413)   norepinephrine (LEVOPHED) Adult infusion 5 mcg/min (07/08/23 1413)   PRN Meds:   Allergies:    Allergies  Allergen Reactions   Clobetasol Propionate Rash    Social History:   Social History   Socioeconomic History   Marital status: Married    Spouse name: Not on file   Number of  children: 3   Years of education: Not on file   Highest education level: Not on file  Occupational History   Occupation: Retired  Tobacco Use   Smoking status: Former    Current packs/day: 0.00    Average packs/day: 3.0 packs/day for 14.0 years (42.0 ttl pk-yrs)    Types: Cigarettes    Start date: 12/11/1952    Quit date: 12/12/1966    Years since quitting: 56.6   Smokeless tobacco: Never  Vaping Use   Vaping status: Never Used  Substance and Sexual Activity   Alcohol use: Yes    Comment: 10/30/2011 "stopped all alcohol 2007; used to drink too much"   Drug use: No   Sexual activity: Never  Other Topics Concern   Not on file  Social History Narrative   Not on file   Social Drivers of Health   Financial Resource Strain: Not on file  Food Insecurity: No Food Insecurity (06/01/2023)   Hunger Vital Sign    Worried About Running Out of Food in the Last Year: Never true    Ran Out of Food in the Last Year: Never true  Transportation Needs: No Transportation Needs (06/01/2023)   PRAPARE - Administrator, Civil Service (Medical): No    Lack of Transportation (Non-Medical): No  Physical Activity: Not on file  Stress: Not on file  Social Connections: Unknown (06/01/2023)   Social Connection and Isolation Panel [NHANES]    Frequency of Communication with Friends and Family: Three times a week    Frequency of Social Gatherings with Friends and Family: Patient unable to answer    Attends Religious Services: Patient unable to answer    Active Member of Clubs or Organizations: Patient unable to answer    Attends Banker Meetings: Patient unable to answer    Marital Status: Patient unable to answer  Intimate Partner Violence: Not At Risk (06/01/2023)   Humiliation, Afraid, Rape, and Kick questionnaire    Fear of Current or Ex-Partner: No    Emotionally Abused: No    Physically Abused: No    Sexually Abused: No    Family History:   Family History  Problem Relation  Age of Onset   Prostate cancer Father 74   Cancer Mother        Stomach   Colon cancer Neg Hx      ROS:  Please see the history of present illness.  All other ROS reviewed and negative.     Physical Exam/Data:   Vitals:   07/08/23 1406 07/08/23 1408 07/08/23 1410 07/08/23 1412  BP: (!) 117/49 (!) 113/50 (!) 111/51 (!) 107/49  Pulse: 63 64 63 62  Resp: 13 15 13 12   SpO2: 93% 95% 94% 94%    Intake/Output Summary (Last 24 hours) at 07/08/2023 1416 Last data filed at 07/08/2023 1413 Gross per 24 hour  Intake 73.51  ml  Output --  Net 73.51 ml      06/04/2023    6:41 PM 06/01/2023    5:52 AM 04/28/2018    3:15 PM  Last 3 Weights  Weight (lbs) 175 lb 0.7 oz 173 lb 1 oz 185 lb 9.6 oz  Weight (kg) 79.4 kg 78.5 kg 84.188 kg     There is no height or weight on file to calculate BMI.  General: Not arousable to painful stimuli, maintaining airway  HEENT: normal Neck: no JVD Vascular: No carotid bruits; Distal pulses 2+ bilaterally Cardiac:  normal S1, S2; RRR; no murmur  Lungs:  clear to auscultation bilaterally, no wheezing, rhonchi or rales  Abd: soft, nontender, no hepatomegaly  Ext: 2-3+ edema Musculoskeletal:  No deformities, BUE and BLE strength normal and equal Skin: warm and dry  Neuro:  CNs 2-12 intact, no focal abnormalities noted Psych:  Normal affect   EKG:  The EKG was personally reviewed and demonstrates: Initial EKG with second-degree type I AV block.  Heart rate 49.  Second EKG with 2-1 AV block. Telemetry:  Telemetry was personally reviewed and demonstrates: With the above findings, also appears like sick sinus syndrome with intermittent pauses.  Relevant CV Studies: Echocardiogram 06/01/2023 1. There is increased flow velocity across the LV outflow tract, due to  hyperdynamic left ventricular contractility. There does not appear to be  true dynamic LV outflow obstruction. Left ventricular ejection fraction,  by estimation, is 70 to 75%. The  left ventricle has  hyperdynamic function. The left ventricle has no  regional wall motion abnormalities. Left ventricular diastolic function  could not be evaluated.   2. Right ventricular systolic function is normal. The right ventricular  size is normal. Tricuspid regurgitation signal is inadequate for assessing  PA pressure.   3. Left atrial size was mild to moderately dilated.   4. The mitral valve is normal in structure. No evidence of mitral valve  regurgitation.   5. The aortic valve is normal in structure. There is mild calcification  of the aortic valve. Aortic valve regurgitation is mild. Aortic valve  sclerosis is present, with no evidence of aortic valve stenosis.    Laboratory Data:  High Sensitivity Troponin:   Recent Labs  Lab 07/08/23 1222  TROPONINIHS 12     Chemistry Recent Labs  Lab 07/08/23 1222  NA 143  K 3.9  CL 111  CO2 20*  GLUCOSE 94  BUN 69*  CREATININE 2.81*  CALCIUM  8.8*  GFRNONAA 21*  ANIONGAP 12    Recent Labs  Lab 07/08/23 1222  PROT 5.5*  ALBUMIN 2.5*  AST 45*  ALT 25  ALKPHOS 76  BILITOT 0.6   Lipids No results for input(s): "CHOL", "TRIG", "HDL", "LABVLDL", "LDLCALC", "CHOLHDL" in the last 168 hours.  Hematology Recent Labs  Lab 07/08/23 1222  WBC 7.1  RBC 2.37*  HGB 7.3*  HCT 23.3*  MCV 98.3  MCH 30.8  MCHC 31.3  RDW 16.8*  PLT 134*   Thyroid No results for input(s): "TSH", "FREET4" in the last 168 hours.  BNPNo results for input(s): "BNP", "PROBNP" in the last 168 hours.  DDimer No results for input(s): "DDIMER" in the last 168 hours.   Radiology/Studies:  DG Chest Portable 1 View Result Date: 07/08/2023 CLINICAL DATA:  Dyspnea. EXAM: PORTABLE CHEST 1 VIEW COMPARISON:  05/31/2023. FINDINGS: Low lung volumes. Cardiomediastinal silhouette is otherwise unchanged. Aortic atherosclerosis. Left basilar opacity with mild blunting of the left costophrenic angle. The right lung  appears clear. No pneumothorax. Degenerative changes of the  right glenohumeral joint. No acute osseous abnormality. IMPRESSION: Low lung volumes. Left basilar opacity with mild blunting of the left costophrenic angle could reflect atelectasis or infiltrate with small left pleural effusion. Electronically Signed   By: Mannie Seek M.D.   On: 07/08/2023 12:54   Assessment and Plan:   Bradycardia Tele with bradycardia  HR initially 30s/40s  Drop into 20s with hypotension. Per ER staff pt talking on admit then became less arousable    NO long pauses seen   Now HR in 70s to 80s   Tele shows SR with Type I second degree AV block  QRS is narrow ( Pt on 5 Epi and 15 of Levophed)   With HR and BP OK he is minimally arousable      Echo shows LVEF and RVEF are normal       Would recomm following   Etiology of bradycardia probably multifactorial  I do not think that the  bradycardia was the driving cause for pt's clinical presentation       WIll continue to follow   If pt improves with higher HR and no other medical issues found will review for further Rx    Given age and frailty though he is not a good candidate   CCM to see pt    Labs still pending         2  Hx CAD  Pt with CTO of RCA   He is s/p DES ot OM in 2012    Prior to admit was on asa and sivmistatin    Trop today is 12   LVEF is normal  on bedside echo  3  Edema    Pt with 3+ pitting edema in LE     Check TSH (was sl low in APril 2024)    Albumin is 2.5     NOte pt written for IV lasix   4  Hx PAF    Not on anticoagulation due to bleeding    NO b blocker due to pauses   5  Anemia Recent admission with coffee-ground emesis with duodenal ulcers.  Hemoglobin 7.3 today.  No obvious signs of bleeding.  AKI Creatinine 2.81 significantly elevated from baseline.  Risk Assessment/Risk Scores:   CHA2DS2-VASc Score = 3   This indicates a 3.2% annual risk of stroke. The patient's score is based upon: CHF History: 0 HTN History: 1 Diabetes History: 0 Stroke History: 0 Vascular Disease  History: 0 Age Score: 2 Gender Score: 0    For questions or updates, please contact [facility] Please consult [email or web address] for contact info under    Signed, Burnetta Cart, PA-C  07/08/2023 2:16 PM    PT seen and examined   I have amended note above by Shawna Dell Pt is a frail 89 yo with hx of CAD an PAF  Presents to ER with increased SOB, lethargy.   Found to be in SB with Wenkebach   Hypotensive    Pt became more bradycardic and less responsive    HR into 20s    This has improved with atropine    NOw on epi and Levophed   PT remains minimally responsive to pain(sternal rub) BP 110s   HR 70s to 80s (SR Valetta Gaudy) HEENT    R cataract   L pupil small NEck  FUll Lung Rhonchi bilaterally  Cardiac RRR  NO murmurs ABd is supple  Ext  2  3+ LE edema   COncern that other metabolic issues driving pt's current presentation.   I am not convinced it is just bradycardia Work up in progress WIll continue to follow     Ola Berger MD

## 2023-07-08 NOTE — H&P (Signed)
 NAME:  Jo Sellards, MRN:  [id number], DOB:  14-Dec-1930, LOS: 0 ADMISSION DATE:  07/08/2023, CONSULTATION DATE:  07/08/2023 REFERRING MD: Garrett Kallman, MD, CHIEF COMPLAINT:  shock    History of Present Illness:  A 89 yr old male patient with HTN, CAD/PCI, dyslipidemia, Afib (not AC due to high fall risk and recent UGIB), April 2025 UGIB due to DU, moderate PCM, chronic anemia, chronic thrombocytopenia, CKD-3a, and BPH, who was sent from NH to ED for SOB and lethargy. He was found bradycardic and hypotensive and he was given Atropine 1 mg IV x1, Epi was started and then Levophed was added. He was alert and oriented upon arrival but now it is difficult to wake him up or take hx. Hx was taken from chart review and NH paper chart. Last admission was due to influenza A infection, during which he was 4.3 pause and slow Afib, BB was d/c. He is on baby ASA daily. Quit smoking 56 yrs ago, smoked 42 py.    Pertinent  Medical History  HTN, CAD/PCI, dyslipidemia, Afib (not AC due to high fall risk and recent UGIB), April 2025 UGIB due to DU, moderate PCM, chronic anemia, chronic thrombocytopenia, CKD-3a, BPH  Significant Hospital Events: Including procedures, antibiotic start and stop dates in addition to other pertinent events   5/8: ED eval and ICU admission. Seen by cardiology: noted  Interim History / Subjective:    Objective    Blood pressure (!) 107/49, pulse 62, resp. rate 12, SpO2 94%.        Intake/Output Summary (Last 24 hours) at 07/08/2023 1444 Last data filed at 07/08/2023 1413 Gross per 24 hour  Intake 73.51 ml  Output --  Net 73.51 ml   There were no vitals filed for this visit.  Examination: General: very lethargic and unresponsive to painful stimuli. On RA, SpO2 93%, comfortable.  HENT: right corneal scarring, normal and reactive left pupil, normal pharynx and oral mucosa. No LNE or thyromegaly. No JVD Lungs: symmetrical air entry bilaterally. No crackles or wheezing Cardiovascular: NL  S1/S2. No m/g/r Abdomen: no distension or tenderness Extremities: +4 edema. Symmetrical  Neuro: GCS: E: 2, V2, M1   Resolved Hospital Problem list     Assessment & Plan:  Cardiogenic shock due to severe bradycardia: second-degree type I AV block, 2-1 heart block. 06/01/2023 Echo EF 70% -Serial LA and Trop -Epi and Levophed for MAP> 65 mmHg and HR>55  -Seen by cardiology: appreciate their input. Not candidate for pacing -Palliative care consult  -Venous doppler LL -PCT  Metabolic encephalology, due to shock and AKI Recent Vit B12 and folate are NL -ABG -Iron studies, TFT, Mg  HTN, CAD/PCI, dyslipidemia, Afib (not AC, high fall risk and recent UGIB)  -Hold meds for now  UGIB due to DU (April 2025) -PPI IV  Moderate PCM -Dietitian consult  Chronic anemia, chronic thrombocytopenia -Monitor  AKI/CKD-3a -IV Lasix and one dose of Albumin -Am labs -I/O chart  BPH -Hold med  Best Practice (right click and "Reselect all SmartList Selections" daily)   Diet/type: NPO DVT prophylaxis prophylactic heparin   Pressure ulcer(s): N/A GI prophylaxis: PPI Lines: N/A Foley:  N/A Code Status:  DNR Last date of multidisciplinary goals of care discussion []   Labs   CBC: Recent Labs  Lab 07/08/23 1222  WBC 7.1  NEUTROABS 5.2  HGB 7.3*  HCT 23.3*  MCV 98.3  PLT 134*    Basic Metabolic Panel: Recent Labs  Lab 07/08/23 1222  NA 143  K  3.9  CL 111  CO2 20*  GLUCOSE 94  BUN 69*  CREATININE 2.81*  CALCIUM  8.8*   GFR: CrCl cannot be calculated (Unknown ideal weight.). Recent Labs  Lab 07/08/23 1222  WBC 7.1    Liver Function Tests: Recent Labs  Lab 07/08/23 1222  AST 45*  ALT 25  ALKPHOS 76  BILITOT 0.6  PROT 5.5*  ALBUMIN 2.5*   No results for input(s): "LIPASE", "AMYLASE" in the last 168 hours. No results for input(s): "AMMONIA" in the last 168 hours.  ABG    Component Value Date/Time   TCO2 23 06/04/2023 1412     Coagulation Profile: No  results for input(s): "INR", "PROTIME" in the last 168 hours.  Cardiac Enzymes: No results for input(s): "CKTOTAL", "CKMB", "CKMBINDEX", "TROPONINI" in the last 168 hours.  HbA1C: Hgb A1c MFr Bld  Date/Time Value Ref Range Status  06/01/2023 04:09 AM 5.8 (H) 4.8 - 5.6 % Final    Comment:    (NOTE) Pre diabetes:          5.7%-6.4%  Diabetes:              >6.4%  Glycemic control for   <7.0% adults with diabetes     CBG: No results for input(s): "GLUCAP" in the last 168 hours.  Review of Systems:   AMS  Past Medical History:  He,  has a past medical history of Acute GI bleeding (10/30/2011), Anginal pain (HCC) (12/2010), Arthritis, BPH (benign prostatic hyperplasia), CAD (coronary artery disease), Carotid stenosis, Diverticulosis, Exertional dyspnea (10/30/2011), GERD (gastroesophageal reflux disease), Glaucoma, Gout, Hyperlipidemia, Hypertension, and Pneumonia (1950's).   Surgical History:   Past Surgical History:  Procedure Laterality Date   BIOPSY OF SKIN SUBCUTANEOUS TISSUE AND/OR MUCOUS MEMBRANE  06/04/2023   Procedure: BIOPSY, SKIN, SUBCUTANEOUS TISSUE, OR MUCOUS MEMBRANE;  Surgeon: Tobin Forts, MD;  Location: [facility];  Service: Gastroenterology;;   COLONOSCOPY     CORONARY ANGIOPLASTY WITH STENT PLACEMENT  12/2010   "1"   CYSTECTOMY     right cheek   ESOPHAGOGASTRODUODENOSCOPY N/A 06/04/2023   Procedure: EGD (ESOPHAGOGASTRODUODENOSCOPY);  Surgeon: Tobin Forts, MD;  Location: [facility];  Service: Gastroenterology;  Laterality: N/A;   EYE MUSCLE SURGERY  1953   right   TRANSURETHRAL RESECTION OF PROSTATE  ~ 2007     Social History:   reports that he quit smoking about 56 years ago. His smoking use included cigarettes. He started smoking about 70 years ago. He has a 42 pack-year smoking history. He has never used smokeless tobacco. He reports current alcohol use. He reports that he does not use drugs.   Family History:  His family history includes Cancer in  his mother; Prostate cancer (age of onset: 52) in his father. There is no history of Colon cancer.   Allergies Allergies  Allergen Reactions   Clobetasol Propionate Rash and Other (See Comments)    Not included on the [facility]     Home Medications  Prior to Admission medications   Medication Sig Start Date End Date Taking? Authorizing Provider  allopurinol  (ZYLOPRIM ) 300 MG tablet Take 300 mg by mouth Daily.  09/11/10   [provider]  aspirin  81 MG tablet Take 81 mg by mouth daily.    [provider]  atorvastatin  (LIPITOR) 40 MG tablet Take 1 tablet (40 mg total) by mouth daily. 06/09/23   Lesa Rape, MD  docusate sodium (COLACE) 100 MG capsule Take 100 mg by mouth daily as needed (for  constipation).    [provider]  famotidine  (PEPCID ) 20 MG tablet Take 20 mg by mouth at bedtime. 04/03/21   [provider]  finasteride  (PROSCAR ) 5 MG tablet Take 5 mg by mouth Daily.  09/12/10   [provider]  fluticasone  (FLONASE ) 50 MCG/ACT nasal spray Place 1 spray into both nostrils 2 (two) times daily as needed for allergies or rhinitis.    [provider]  hydrALAZINE  (APRESOLINE ) 50 MG tablet Take 1 tablet (50 mg total) by mouth 3 (three) times daily. 06/08/23   Lesa Rape, MD  hydrochlorothiazide  (HYDRODIURIL ) 25 MG tablet Take 25 mg by mouth daily.    [provider]  latanoprost  (XALATAN ) 0.005 % ophthalmic solution Place 1 drop into the left eye at bedtime. 04/14/19   [provider]  lisinopril  (PRINIVIL ,ZESTRIL ) 40 MG tablet Take 40 mg by mouth Daily.    [provider]  melatonin 5 MG TABS Take 1 tablet (5 mg total) by mouth at bedtime as needed. 06/08/23   Lesa Rape, MD  nitroGLYCERIN  (NITROSTAT ) 0.4 MG SL tablet Place 1 tablet (0.4 mg total) under the tongue every 5 (five) minutes as needed. Patient not taking: Reported on 06/01/2023 04/07/16   Eilleen Grates, MD  pantoprazole  (PROTONIX ) 40 MG tablet Take 1 tablet (40 mg  total) by mouth 2 (two) times daily. 06/08/23   Lesa Rape, MD  tamsulosin  (FLOMAX ) 0.4 MG CAPS capsule Take 0.4 mg by mouth daily.    [provider]  timolol  (TIMOPTIC ) 0.5 % ophthalmic solution Place 1 drop into the left eye 2 (two) times daily. 04/05/18   [provider]     Critical care time: 55 min   Madelynn Schilder, MD [facility] Pager: see AMION

## 2023-07-08 NOTE — ED Provider Notes (Signed)
 [facility] AT [facility] Provider Note  CSN: [id number] Arrival date & time: 07/08/23 1217  Chief Complaint(s) Shortness of Breath and Hypotension  HPI Carlie Franchina is a 89 y.o. male with PMH HTN, CAD status post DES placement, A-fib not on anticoagulation, upper GI bleeding, CKD 3 who presents emergency department for evaluation of shortness of breath and altered mental status.  Patient reportedly was well at breakfast but had sudden decompensation and found to be hypotensive and bradycardic for EMS.  Arrives  on an epinephrine drip and EMS states that mental status has improved.  Here in the emergency room, patient is alert and answering questions but appears significant fluid overloaded.   Past Medical History Past Medical History:  Diagnosis Date   Acute GI bleeding 10/30/2011   Anginal pain (HCC) 12/2010   Arthritis    "knees and left shoulder is completely shot"   BPH (benign prostatic hyperplasia)    CAD (coronary artery disease)    RCA occluded (Cath [place] 1980s);  LHC 12/22/10: dLM 30%, prox to mid LAD 30%, oOM 95%, AV branches supplied collats to PDA, RCA occluded, EF 65%.  He underwent placement of a Promus DES to the OM.   Carotid stenosis    0 - 39% bilateral   Diverticulosis    Exertional dyspnea 10/30/2011   GERD (gastroesophageal reflux disease)    Glaucoma    Gout    Hyperlipidemia    Hypertension    Pneumonia 1950's   Patient Active Problem List   Diagnosis Date Noted   Shock (HCC) 07/08/2023   Hyponatremia 06/04/2023   Duodenal ulcer 06/04/2023   Hematemesis with nausea 06/03/2023   Melena 06/03/2023   Acute blood loss anemia 06/03/2023   Low TSH level 06/01/2023   PAF (paroxysmal atrial fibrillation) (HCC) 05/31/2023   Fall at home, initial encounter 05/31/2023   Elevated troponin 05/31/2023   Influenza A 05/31/2023   Acute bronchitis 05/31/2023   ARF (acute renal failure) (HCC) 05/31/2023   Thrombocytopenia (HCC)  05/31/2023   Pain due to onychomycosis of toenails of both feet 11/07/2019   Subdural hematoma (HCC) 08/16/2012   Hemorrhoids, internal, with bleeding 10/31/2011   Lower GI bleed 10/30/2011   HTN (hypertension) 12/17/2010   Dyslipidemia 12/17/2010   CAD S/P percutaneous coronary angioplasty    Carotid stenosis    Home Medication(s) Prior to Admission medications   Medication Sig Start Date End Date Taking? Authorizing Provider  aspirin  81 MG tablet Take 81 mg by mouth daily.   Yes [provider]  atorvastatin  (LIPITOR) 40 MG tablet Take 1 tablet (40 mg total) by mouth daily. 06/09/23  Yes Lesa Rape, MD  Cholecalciferol (VITAMIN D3) 1000 units CAPS Take 1,000 Units by mouth in the morning.   Yes [provider]  famotidine  (PEPCID ) 20 MG tablet Take 20 mg by mouth at bedtime. 04/03/21  Yes [provider]  finasteride  (PROSCAR ) 5 MG tablet Take 5 mg by mouth in the morning. 09/12/10  Yes [provider]  hydrALAZINE  (APRESOLINE ) 50 MG tablet Take 1 tablet (50 mg total) by mouth 3 (three) times daily. 06/08/23  Yes Lesa Rape, MD  hydrochlorothiazide  (HYDRODIURIL ) 25 MG tablet Take 25 mg by mouth daily.   Yes [provider]  latanoprost  (XALATAN ) 0.005 % ophthalmic solution Place 1 drop into the left eye at bedtime. 04/14/19  Yes [provider]  lisinopril  (PRINIVIL ,ZESTRIL ) 40 MG tablet Take 40 mg by mouth Daily.   Yes [provider]  melatonin 5 MG TABS Take 1 tablet (5 mg total) by mouth at bedtime as needed. Patient taking differently: Take 5 mg by mouth at bedtime. 06/08/23  Yes Lesa Rape, MD  nitroGLYCERIN  (NITROSTAT ) 0.4 MG SL tablet Place 1 tablet (0.4 mg total) under the tongue every 5 (five) minutes as needed. Patient taking differently: Place 0.4 mg under the tongue every 5 (five) minutes x 3 doses as needed for chest pain (CALL 9-1-1, IF NO RELIEF). 04/07/16  Yes Eilleen Grates, MD  pantoprazole  (PROTONIX ) 40 MG tablet Take  1 tablet (40 mg total) by mouth 2 (two) times daily. Patient taking differently: Take 40 mg by mouth in the morning and at bedtime. 06/08/23  Yes Lesa Rape, MD  tamsulosin  (FLOMAX ) 0.4 MG CAPS capsule Take 0.4 mg by mouth daily.   Yes [provider]  timolol  (TIMOPTIC ) 0.5 % ophthalmic solution Place 1 drop into the left eye in the morning and at bedtime. 04/05/18  Yes [provider]                                                                                                                                    Past Surgical History Past Surgical History:  Procedure Laterality Date   BIOPSY OF SKIN SUBCUTANEOUS TISSUE AND/OR MUCOUS MEMBRANE  06/04/2023   Procedure: BIOPSY, SKIN, SUBCUTANEOUS TISSUE, OR MUCOUS MEMBRANE;  Surgeon: Tobin Forts, MD;  Location: [facility];  Service: Gastroenterology;;   COLONOSCOPY     CORONARY ANGIOPLASTY WITH STENT PLACEMENT  12/2010   "1"   CYSTECTOMY     right cheek   ESOPHAGOGASTRODUODENOSCOPY N/A 06/04/2023   Procedure: EGD (ESOPHAGOGASTRODUODENOSCOPY);  Surgeon: Tobin Forts, MD;  Location: [facility];  Service: Gastroenterology;  Laterality: N/A;   EYE MUSCLE SURGERY  1953   right   TRANSURETHRAL RESECTION OF PROSTATE  ~ 2007   Family History Family History  Problem Relation Age of Onset   Prostate cancer Father 89   Cancer Mother        Stomach   Colon cancer Neg Hx     Social History Social History   Tobacco Use   Smoking status: Former    Current packs/day: 0.00    Average packs/day: 3.0 packs/day for 14.0 years (42.0 ttl pk-yrs)    Types: Cigarettes    Start date: 12/11/1952    Quit date: 12/12/1966    Years since quitting: 56.6   Smokeless tobacco: Never  Vaping Use   Vaping status: Never Used  Substance Use Topics   Alcohol use: Yes    Comment: 10/30/2011 "stopped all alcohol 2007; used to drink too much"   Drug use: No   Allergies Clobetasol propionate  Review of Systems Review of Systems   Respiratory:  Positive for shortness of breath.   Cardiovascular:  Positive for leg swelling.    Physical Exam Vital Signs  I have reviewed the triage vital  signs BP (!) 107/49   Pulse 62   Resp 12   SpO2 94%   Physical Exam Vitals and nursing note reviewed.  Constitutional:      General: He is not in acute distress.    Appearance: He is well-developed. He is ill-appearing.  HENT:     Head: Normocephalic and atraumatic.  Eyes:     Conjunctiva/sclera: Conjunctivae normal.  Cardiovascular:     Rate and Rhythm: Normal rate and regular rhythm.     Heart sounds: No murmur heard. Pulmonary:     Effort: Pulmonary effort is normal. No respiratory distress.     Breath sounds: Rales present.  Abdominal:     Palpations: Abdomen is soft.     Tenderness: There is no abdominal tenderness.  Musculoskeletal:        General: No swelling.     Cervical back: Neck supple.     Right lower leg: Edema present.     Left lower leg: Edema present.  Skin:    General: Skin is warm and dry.     Capillary Refill: Capillary refill takes less than 2 seconds.  Neurological:     Mental Status: He is alert.  Psychiatric:        Mood and Affect: Mood normal.     ED Results and Treatments Labs (all labs ordered are listed, but only abnormal results are displayed) Labs Reviewed  COMPREHENSIVE METABOLIC PANEL WITH GFR - Abnormal; Notable for the following components:      Result Value   CO2 20 (*)    BUN 69 (*)    Creatinine, Ser 2.81 (*)    Calcium  8.8 (*)    Total Protein 5.5 (*)    Albumin 2.5 (*)    AST 45 (*)    GFR, Estimated 21 (*)    All other components within normal limits  CBC WITH DIFFERENTIAL/PLATELET - Abnormal; Notable for the following components:   RBC 2.37 (*)    Hemoglobin 7.3 (*)    HCT 23.3 (*)    RDW 16.8 (*)    Platelets 134 (*)    All other components within normal limits  BRAIN NATRIURETIC PEPTIDE - Abnormal; Notable for the following components:   B  Natriuretic Peptide 204.4 (*)    All other components within normal limits  MRSA NEXT GEN BY PCR, NASAL  TSH  T4, FREE  LACTIC ACID, PLASMA  LACTIC ACID, PLASMA  BLOOD GAS, ARTERIAL  IRON AND TIBC  MAGNESIUM  CALCIUM , IONIZED  TROPONIN I (HIGH SENSITIVITY)  TROPONIN I (HIGH SENSITIVITY)                                                                                                                          Radiology DG Chest Portable 1 View Result Date: 07/08/2023 CLINICAL DATA:  Dyspnea. EXAM: PORTABLE CHEST 1 VIEW COMPARISON:  05/31/2023. FINDINGS: Low lung volumes. Cardiomediastinal silhouette is otherwise unchanged. Aortic atherosclerosis. Left basilar opacity with mild blunting of  the left costophrenic angle. The right lung appears clear. No pneumothorax. Degenerative changes of the right glenohumeral joint. No acute osseous abnormality. IMPRESSION: Low lung volumes. Left basilar opacity with mild blunting of the left costophrenic angle could reflect atelectasis or infiltrate with small left pleural effusion. Electronically Signed   By: Mannie Seek M.D.   On: 07/08/2023 12:54    Pertinent labs & imaging results that were available during my care of the patient were reviewed by me and considered in my medical decision making (see MDM for details).  Medications Ordered in ED Medications  norepinephrine (LEVOPHED) 4mg  in (0.016 mg/mL) premix infusion (5 mcg/min Intravenous Infusion Verify 07/08/23 1413)  EPINEPHrine (ADRENALIN) 5 mg in NS 250 mL (0.02 mg/mL) premix infusion (8 mcg/min Intravenous Rate/Dose Verify 07/08/23 1413)  docusate sodium (COLACE) capsule 100 mg (has no administration in time range)  polyethylene glycol (MIRALAX / GLYCOLAX) packet 17 g (has no administration in time range)  heparin  injection 5,000 Units (5,000 Units Subcutaneous Given 07/08/23 1525)  furosemide (LASIX) injection 40 mg (has no administration in time range)  albumin human 25 % solution 12.5 g  (has no administration in time range)  potassium chloride  10 mEq in 100 mL IVPB (has no administration in time range)  pantoprazole  (PROTONIX ) injection 40 mg (40 mg Intravenous Given 07/08/23 1525)  atropine 1 MG/10ML injection 1 mg (1 mg Intravenous Given 07/08/23 1326)                                                                                                                                     Procedures .Critical Care  Performed by: Karlyn Overman, MD Authorized by: Karlyn Overman, MD   Critical care provider statement:    Critical care time (minutes):  30   Critical care was necessary to treat or prevent imminent or life-threatening deterioration of the following conditions:  Shock and cardiac failure   Critical care was time spent personally by me on the following activities:  Development of treatment plan with patient or surrogate, discussions with consultants, evaluation of patient's response to treatment, examination of patient, ordering and review of laboratory studies, ordering and review of radiographic studies, ordering and performing treatments and interventions, pulse oximetry, re-evaluation of patient's condition and review of old charts   (including critical care time)  Medical Decision Making / ED Course   This patient presents to the ED for concern of altered mental status, shortness of breath, this involves an extensive number of treatment options, and is a complaint that carries with it a high risk of complications and morbidity.  The differential diagnosis includes Pe, PTX, Pulmonary Edema, ARDS, COPD/Asthma, ACS, CHF exacerbation, Arrhythmia, Pericardial Effusion/Tamponade, Anemia, Sepsis, Acidosis/Hypercapnia, Anxiety, Viral URI  MDM: Patient seen in the emergency room for evaluation of altered mental status and shortness of breath.  Physical exam with significant lower extremity edema and mild rales at the bases.  Laboratory  valuation with a hemoglobin of 7.3, BUN  69, creatinine 2.81, albumin 2.5, BNP 205.  While in the emergency department, we did trial patient off of the epinephrine drip that he arrived on and he subsequently had significant bradycardic episodes with associated hypotension and altered mental status.  Mobitz type II seen on EKG.  Patient given atropine and started on a Levophed drip.  If patient persistently bradycardic despite Levophed and transition to epinephrine.  Spoke with the cardiology team on-call Dr. Avanell Bob who came to bedside and is recommending medical stabilization in the ICU prior to consideration of emergent pacer.  Patient had better response to the epinephrine drip and will be admitted to the ICU.  Of note, I did speak with the patient's son Jonelle Neri who confirms that the patient is DNR/DNI.  I updated this in the chart   Additional history obtained: -Additional history obtained from son -External records from outside source obtained and reviewed including: Chart review including previous notes, labs, imaging, consultation notes   Lab Tests: -I ordered, reviewed, and interpreted labs.   The pertinent results include:   Labs Reviewed  COMPREHENSIVE METABOLIC PANEL WITH GFR - Abnormal; Notable for the following components:      Result Value   CO2 20 (*)    BUN 69 (*)    Creatinine, Ser 2.81 (*)    Calcium  8.8 (*)    Total Protein 5.5 (*)    Albumin 2.5 (*)    AST 45 (*)    GFR, Estimated 21 (*)    All other components within normal limits  CBC WITH DIFFERENTIAL/PLATELET - Abnormal; Notable for the following components:   RBC 2.37 (*)    Hemoglobin 7.3 (*)    HCT 23.3 (*)    RDW 16.8 (*)    Platelets 134 (*)    All other components within normal limits  BRAIN NATRIURETIC PEPTIDE - Abnormal; Notable for the following components:   B Natriuretic Peptide 204.4 (*)    All other components within normal limits  MRSA NEXT GEN BY PCR, NASAL  TSH  T4, FREE  LACTIC ACID, PLASMA  LACTIC ACID, PLASMA  BLOOD GAS, ARTERIAL   IRON AND TIBC  MAGNESIUM  CALCIUM , IONIZED  TROPONIN I (HIGH SENSITIVITY)  TROPONIN I (HIGH SENSITIVITY)      EKG   EKG Interpretation Date/Time:  Thursday Jul 08 2023 12:26:34 EDT Ventricular Rate:  49 PR Interval:  346 QRS Duration:  130 QT Interval:  520 QTC Calculation: 470 R Axis:   -18  Text Interpretation: Mobitz I 2-degree AV block (Wenckebach block) Prolonged PR interval LVH with IVCD and secondary repol abnrm Confirmed by Koryn Charlot ([id number]) on 07/08/2023 12:54:25 PM         Imaging Studies ordered: I ordered imaging studies including chest x-ray I independently visualized and interpreted imaging. I agree with the radiologist interpretation   Medicines ordered and prescription drug management: Meds ordered this encounter  Medications   norepinephrine (LEVOPHED) 4mg  in (0.016 mg/mL) premix infusion   EPINEPHrine (ADRENALIN) 5 mg in NS 250 mL (0.02 mg/mL) premix infusion   atropine 1 MG/10ML injection 1 mg   docusate sodium (COLACE) capsule 100 mg   polyethylene glycol (MIRALAX / GLYCOLAX) packet 17 g   heparin  injection 5,000 Units   furosemide (LASIX) injection 40 mg   albumin human 25 % solution 12.5 g   potassium chloride  10 mEq in 100 mL IVPB   pantoprazole  (PROTONIX ) injection 40 mg    -I  have reviewed the patients home medicines and have made adjustments as needed  Critical interventions Epinephrine drip, atropine, emergent cardiology consultation  Consultations Obtained: I requested consultation with the cardiology team on-call,  and discussed lab and imaging findings as well as pertinent plan - they recommend: Epi drip, medical stabilization ICU   Cardiac Monitoring: The patient was maintained on a cardiac monitor.  I personally viewed and interpreted the cardiac monitored which showed an underlying rhythm of: Sinus bradycardia, Mobitz type II  Social Determinants of Health:  Factors impacting patients care include: Lives in skilled  nursing facility   Reevaluation: After the interventions noted above, I reevaluated the patient and found that they have :improved  Co morbidities that complicate the patient evaluation  Past Medical History:  Diagnosis Date   Acute GI bleeding 10/30/2011   Anginal pain (HCC) 12/2010   Arthritis    "knees and left shoulder is completely shot"   BPH (benign prostatic hyperplasia)    CAD (coronary artery disease)    RCA occluded (Cath [place] 1980s);  LHC 12/22/10: dLM 30%, prox to mid LAD 30%, oOM 95%, AV branches supplied collats to PDA, RCA occluded, EF 65%.  He underwent placement of a Promus DES to the OM.   Carotid stenosis    0 - 39% bilateral   Diverticulosis    Exertional dyspnea 10/30/2011   GERD (gastroesophageal reflux disease)    Glaucoma    Gout    Hyperlipidemia    Hypertension    Pneumonia 1950's      Dispostion: I considered admission for this patient, and patient require hospital admission for bradycardia, hypotension and suspected SA node failure.     Final Clinical Impression(s) / ED Diagnoses Final diagnoses:  Bradycardia     @PCDICTATION @    Karlyn Overman, MD 07/08/23 1537

## 2023-07-08 NOTE — Consult Note (Cosign Needed Addendum)
 Palliative Care Consult Note                                  Date: 07/08/2023   Patient Name: [name]  DOB: May 20, 1930  MRN: [id number]  Age / Sex: 89 y.o., male  PCP: Tisovec, Kristina Pfeiffer, MD Referring Physician: Arlyne Bering, MD  Reason for Consultation: Establishing goals of care  HPI/Patient Profile: 89 y.o. male  with past medical history of CAD/PCI, A-fib (not on AC due to high fall risk and recent UGIB), chronic thrombocytopenia, chronic anemia, CKD stage IIIb, moderate PCM, HTN, and dyslipidemia.  He presented to the ED on 07/08/2023 with shortness of breath and lethargy.  He was found to be bradycardic and hypotensive, given atropine  1 mg IV x 1.  Started on epinephrine  and Levophed  drips.  Recently hospitalized at [facility] 05/31/2023 through 06/08/2023 with new onset A-fib, influenza A, upper GI bleed with EGD showing duodenal ulcers.  Palliative Medicine has been consulted for goals of care.   Subjective:   Extensive chart review has been completed prior to meeting with patient/family including labs, vital signs, imaging, progress/consult notes, orders, medications and available advance directive documents.    I assessed patient at bedside in the ED. Received update from RN.   I met with son to discuss diagnosis, prognosis, GOC, EOL wishes, disposition, and options.  I introduced Palliative Medicine as specialized medical care for people living with serious illness. It focuses on providing relief from the symptoms and stress of a serious illness.   We discussed patient's current illness and what it means in the larger context of his ongoing co-morbidities. Current clinical status was reviewed.   Created space and opportunity for family to express thoughts and feelings regarding current medical situation. Values and goals of care were attempted to be elicited.  Family shares that patient's functional status and quality of life has  been declining for some time.  The difference between full scope medical intervention and comfort care was considered. I introduced the concept of a comfort path as de-escalating and stopping full scope medical interventions, allowing a natural course to occur. Discussed that the goal is comfort and dignity rather than cure/prolonging life.   Discussed transitioning to comfort care in the hospital, and what that would look like--no labs, no cardiac monitoring, no IV fluids, minimizing medications, and utilizing medication for pain or other symptoms at end-of-life.  Also discussed that vasopressors would be weaned off, and that patient would likely pass away in the hospital in hours to days.  Reviewed natural trajectory at end-of-life.  Family is agreeable to transition to comfort care at this time.  They we will communicate with nursing staff when they are ready to wean off vasopressor drips.  Questions and concerns addressed.  Emotional support provided.   Review of Systems  Neurological:  Positive for weakness.    Objective:   Primary Diagnoses: Present on Admission:  Shock [facility])   Physical Exam Vitals reviewed.  Constitutional:      General: He is not in acute distress.    Appearance: He is ill-appearing.  Pulmonary:     Effort: Pulmonary effort is normal.  Neurological:     Mental Status: He is alert.     Motor: Weakness present.     Comments: Oriented to person and place     Palliative Assessment/Data: PPS 20%     Assessment & Plan:  SUMMARY OF RECOMMENDATIONS   Transition to comfort care No further labs, IV fluids, etc Minimize medications Epinephrine  and levophed  drips to be discontinued  when family is ready (decrease vasopressors to half the current rate over 10 minutes, then wean to off over 10 minutes) PMT will continue to support  Primary Decision Maker: HCPOA - son/Andy  Code Status/Advance Care Planning: DNR  Symptom Management:  Dilaudid  prn  for pain or dyspnea Lorazepam  (ATIVAN ) prn for anxiety Haloperidol  (HALDOL ) prn for agitation  Glycopyrrolate  (ROBINUL ) for excessive secretions Ondansetron  (ZOFRAN ) prn for nausea Polyvinyl alcohol  (LIQUIFILM TEARS) prn for dry eyes Antiseptic oral rinse (BIOTENE) prn for dry mouth   Prognosis:  Hours - Days  Discharge Planning:  Anticipated Hospital Death   Discussed with: Dr. Marene Shape and RN   Thank you for allowing us  to participate in the care of [name]   Time Total: 75 minutes  Detailed review of medical records (labs, imaging, vital signs), medically appropriate exam, discussed with treatment team, counseling and education to patient, family, & staff, documenting clinical information, medication management, coordination of care.   Signed by: Maisie Scotland, NP Palliative Medicine Team  Team Phone # [phone]  For individual providers, please see AMION

## 2023-07-08 NOTE — Progress Notes (Signed)
 Notified Dr. Zaida Hertz of Epi/Levophed titration parameters the same, MD stated to wean Levophed first.

## 2023-07-08 NOTE — ED Notes (Signed)
 Phlebotomy at bedside to collect labs.

## 2023-07-08 NOTE — ED Notes (Signed)
 Epinephrine drip from EMS discontinued per Kommer MD to see how pt tolerates.

## 2023-07-08 NOTE — ED Notes (Signed)
 Attempted to call report to ICU, nurse not available at this time and will call back.

## 2023-07-08 NOTE — ED Triage Notes (Signed)
 Pt presents to ED via EMS with reports of shortness of breath and being lethargic. Pt ate breakfast this morning then later was lethargic and responsive to only painful stimuli. GCS 15 on arrival to ED with EMS. Pt arrives to ED with epinephrine drip infusing at 4. Pt is normally ambulatory. First blood pressure 70/50 with weak radial per EMS. Extreme swelling noted to lower extremities. CHF hx. Alert and oriented x4.

## 2023-07-09 ENCOUNTER — Encounter (HOSPITAL_COMMUNITY)

## 2023-07-09 DIAGNOSIS — R579 Shock, unspecified: Secondary | ICD-10-CM | POA: Diagnosis not present

## 2023-07-09 DIAGNOSIS — G9341 Metabolic encephalopathy: Secondary | ICD-10-CM | POA: Diagnosis not present

## 2023-07-09 DIAGNOSIS — Z515 Encounter for palliative care: Secondary | ICD-10-CM | POA: Diagnosis not present

## 2023-07-09 DIAGNOSIS — R570 Cardiogenic shock: Secondary | ICD-10-CM | POA: Diagnosis not present

## 2023-07-09 DIAGNOSIS — R001 Bradycardia, unspecified: Secondary | ICD-10-CM | POA: Diagnosis not present

## 2023-07-09 LAB — CALCIUM, IONIZED: Calcium, Ionized, Serum: 5.2 mg/dL (ref 4.5–5.6)

## 2023-07-09 NOTE — Progress Notes (Signed)
 Received report from [facility] for patient transfer to room 6N2.

## 2023-07-09 NOTE — Progress Notes (Signed)
 NAME:  [name], MRN:  [id number], DOB:  [birth year], LOS: 1 ADMISSION DATE:  07/08/2023, CONSULTATION DATE:   07/08/2023 REFERRING MD:  Garrett Kallman, MD, CHIEF COMPLAINT:  shock    History of Present Illness:  A 89 yr old male patient with HTN, CAD/PCI, dyslipidemia, Afib (not AC due to high fall risk and recent UGIB), April 2025 UGIB due to DU, moderate PCM, chronic anemia, chronic thrombocytopenia, CKD-3a, and BPH, who was sent from NH to ED for SOB and lethargy. He was found bradycardic and hypotensive and he was given Atropine  1 mg IV x1, Epi was started and then Levophed  was added. He was alert and oriented upon arrival but now it is difficult to wake him up or take hx. Hx was taken from chart review and NH paper chart. Last admission was due to influenza A infection, during which he was 4.3 pause and slow Afib, BB was d/c. He is on baby ASA daily. Quit smoking 56 yrs ago, smoked 42 py.   Pertinent  Medical History  HTN, CAD/PCI, dyslipidemia, Afib (not AC due to high fall risk and recent UGIB), April 2025 UGIB due to DU, moderate PCM, chronic anemia, chronic thrombocytopenia, CKD-3a, BPH  Significant Hospital Events: Including procedures, antibiotic start and stop dates in addition to other pertinent events   5/8: ED eval and ICU admission. Seen by cardiology: noted  5/9: son and daughter at the bedside and want comfort care only   Interim History / Subjective:    Objective    Blood pressure (!) 78/55, pulse 81, temperature (!) 97.5 F (36.4 C), resp. rate 15, SpO2 90%.        Intake/Output Summary (Last 24 hours) at 07/09/2023 1003 Last data filed at 07/09/2023 0900 Gross per 24 hour  Intake 759.23 ml  Output --  Net 759.23 ml   There were no vitals filed for this visit.  Examination: General: very lethargic and unresponsive to painful stimuli. On RA, SpO2 93%, comfortable.  HENT: right corneal scarring, normal and reactive left pupil, normal pharynx and oral mucosa. No LNE or  thyromegaly. No JVD Lungs: symmetrical air entry bilaterally. No crackles or wheezing Cardiovascular: NL S1/S2. No m/g/r Abdomen: no distension or tenderness Extremities: +4 edema. Symmetrical  Neuro: GCS: E: 2, V2, M1   Resolved Hospital Problem list   Bradycardia   Assessment & Plan:  Cardiogenic shock due to severe bradycardia: second-degree type I AV block, 2-1 heart block. 06/01/2023 Echo EF 70% -d/c Epi and Levophed   -Seen by cardiology: appreciate their input. Not candidate for pacing -Palliative care consult    Metabolic encephalology, due to shock and AKI Recent Vit B12 and folate are NL Fe def   HTN, CAD/PCI, dyslipidemia, Afib (not AC, high fall risk and recent UGIB)    UGIB due to DU (April 2025)   Moderate PCM   Chronic anemia, chronic thrombocytopenia  AKI/CKD-3a  BPH  Best Practice (right click and "Reselect all SmartList Selections" daily)   Code Status:  DNR Last date of multidisciplinary goals of care discussion [comfort care]  Labs   CBC: Recent Labs  Lab 07/08/23 1222 07/08/23 1530  WBC 7.1  --   NEUTROABS 5.2  --   HGB 7.3* 7.1*  HCT 23.3* 21.0*  MCV 98.3  --   PLT 134*  --     Basic Metabolic Panel: Recent Labs  Lab 07/08/23 1222 07/08/23 1530 07/08/23 1553  NA 143 143  --   K 3.9 3.0*  --  CL 111  --   --   CO2 20*  --   --   GLUCOSE 94  --   --   BUN 69*  --   --   CREATININE 2.81*  --   --   CALCIUM  8.8*  --   --   MG  --   --  1.8   GFR: CrCl cannot be calculated (Unknown ideal weight.). Recent Labs  Lab 07/08/23 1222 07/08/23 1553  WBC 7.1  --   LATICACIDVEN  --  2.3*    Liver Function Tests: Recent Labs  Lab 07/08/23 1222  AST 45*  ALT 25  ALKPHOS 76  BILITOT 0.6  PROT 5.5*  ALBUMIN  2.5*   No results for input(s): "LIPASE", "AMYLASE" in the last 168 hours. No results for input(s): "AMMONIA" in the last 168 hours.  ABG    Component Value Date/Time   PHART 7.313 (L) 07/08/2023 1530   PCO2ART 43.8  07/08/2023 1530   PO2ART 72 (L) 07/08/2023 1530   HCO3 22.2 07/08/2023 1530   TCO2 23 07/08/2023 1530   ACIDBASEDEF 4.0 (H) 07/08/2023 1530   O2SAT 93 07/08/2023 1530     Coagulation Profile: No results for input(s): "INR", "PROTIME" in the last 168 hours.  Cardiac Enzymes: No results for input(s): "CKTOTAL", "CKMB", "CKMBINDEX", "TROPONINI" in the last 168 hours.  HbA1C: Hgb A1c MFr Bld  Date/Time Value Ref Range Status  06/01/2023 04:09 AM 5.8 (H) 4.8 - 5.6 % Final    Comment:    (NOTE) Pre diabetes:          5.7%-6.4%  Diabetes:              >6.4%  Glycemic control for   <7.0% adults with diabetes     CBG: Recent Labs  Lab 07/08/23 1944  GLUCAP 143*    Review of Systems:   AMS  Past Medical History:  He,  has a past medical history of Acute GI bleeding (10/30/2011), Anginal pain (HCC) (12/2010), Arthritis, BPH (benign prostatic hyperplasia), CAD (coronary artery disease), Carotid stenosis, Diverticulosis, Exertional dyspnea (10/30/2011), GERD (gastroesophageal reflux disease), Glaucoma, Gout, Hyperlipidemia, Hypertension, and Pneumonia (1950's).   Surgical History:   Past Surgical History:  Procedure Laterality Date   BIOPSY OF SKIN SUBCUTANEOUS TISSUE AND/OR MUCOUS MEMBRANE  06/04/2023   Procedure: BIOPSY, SKIN, SUBCUTANEOUS TISSUE, OR MUCOUS MEMBRANE;  Surgeon: Tobin Forts, MD;  Location: [facility];  Service: Gastroenterology;;   COLONOSCOPY     CORONARY ANGIOPLASTY WITH STENT PLACEMENT  12/2010   "1"   CYSTECTOMY     right cheek   ESOPHAGOGASTRODUODENOSCOPY N/A 06/04/2023   Procedure: EGD (ESOPHAGOGASTRODUODENOSCOPY);  Surgeon: Tobin Forts, MD;  Location: [facility];  Service: Gastroenterology;  Laterality: N/A;   EYE MUSCLE SURGERY  1953   right   TRANSURETHRAL RESECTION OF PROSTATE  ~ 2007     Social History:   reports that he quit smoking about 56 years ago. His smoking use included cigarettes. He started smoking about 70 years ago. He has a 42  pack-year smoking history. He has never used smokeless tobacco. He reports current alcohol  use. He reports that he does not use drugs.   Family History:  His family history includes Cancer in his mother; Prostate cancer (age of onset: 56) in his father. There is no history of Colon cancer.   Allergies Allergies  Allergen Reactions   Clobetasol Propionate Rash and Other (See Comments)    Not included on the [facility]  Home Medications  Prior to Admission medications   Medication Sig Start Date End Date Taking? Authorizing Provider  aspirin  81 MG tablet Take 81 mg by mouth daily.   Yes [provider]  atorvastatin  (LIPITOR) 40 MG tablet Take 1 tablet (40 mg total) by mouth daily. 06/09/23  Yes Lesa Rape, MD  Cholecalciferol (VITAMIN D3) 1000 units CAPS Take 1,000 Units by mouth in the morning.   Yes [provider]  famotidine  (PEPCID ) 20 MG tablet Take 20 mg by mouth at bedtime. 04/03/21  Yes [provider]  finasteride  (PROSCAR ) 5 MG tablet Take 5 mg by mouth in the morning. 09/12/10  Yes [provider]  hydrALAZINE  (APRESOLINE ) 50 MG tablet Take 1 tablet (50 mg total) by mouth 3 (three) times daily. 06/08/23  Yes Lesa Rape, MD  hydrochlorothiazide  (HYDRODIURIL ) 25 MG tablet Take 25 mg by mouth daily.   Yes [provider]  latanoprost  (XALATAN ) 0.005 % ophthalmic solution Place 1 drop into the left eye at bedtime. 04/14/19  Yes [provider]  lisinopril  (PRINIVIL ,ZESTRIL ) 40 MG tablet Take 40 mg by mouth Daily.   Yes [provider]  melatonin 5 MG TABS Take 1 tablet (5 mg total) by mouth at bedtime as needed. Patient taking differently: Take 5 mg by mouth at bedtime. 06/08/23  Yes Lesa Rape, MD  nitroGLYCERIN  (NITROSTAT ) 0.4 MG SL tablet Place 1 tablet (0.4 mg total) under the tongue every 5 (five) minutes as needed. Patient taking differently: Place 0.4 mg under the tongue every 5 (five) minutes x 3 doses as needed for chest pain  (CALL 9-1-1, IF NO RELIEF). 04/07/16  Yes Eilleen Grates, MD  pantoprazole  (PROTONIX ) 40 MG tablet Take 1 tablet (40 mg total) by mouth 2 (two) times daily. Patient taking differently: Take 40 mg by mouth in the morning and at bedtime. 06/08/23  Yes Lesa Rape, MD  tamsulosin  (FLOMAX ) 0.4 MG CAPS capsule Take 0.4 mg by mouth daily.   Yes [provider]  timolol  (TIMOPTIC ) 0.5 % ophthalmic solution Place 1 drop into the left eye in the morning and at bedtime. 04/05/18  Yes [provider]     Madelynn Schilder, MD [facility] Pager: see AMION

## 2023-07-09 NOTE — Plan of Care (Signed)
  Problem: Clinical Measurements: Goal: Quality of life will improve Outcome: Progressing   Problem: Role Relationship: Goal: Family's ability to cope with current situation will improve Outcome: Progressing   Problem: Coping: Goal: Level of anxiety will decrease Outcome: Progressing   Problem: Pain Managment: Goal: General experience of comfort will improve and/or be controlled Outcome: Progressing   Problem: Safety: Goal: Ability to remain free from injury will improve Outcome: Progressing

## 2023-07-09 NOTE — Progress Notes (Signed)
 Palliative Medicine Progress Note   Patient Name: [name]       Date: 07/09/2023 DOB: 1930/10/13  Age: 89 y.o. MRN#: [id number] Attending Physician: Arlyne Bering, MD Primary Care Physician: Suzzanne Estrin, MD Admit Date: 07/08/2023   HPI/Patient Profile: 89 y.o. male  with past medical history of CAD/PCI, A-fib (not on AC due to high fall risk and recent UGIB), chronic thrombocytopenia, chronic anemia, CKD stage IIIb, moderate PCM, HTN, and dyslipidemia.  He presented to the ED on 07/08/2023 with shortness of breath and lethargy.  He was found to be bradycardic and hypotensive, given atropine  1 mg IV x 1.  Started on epinephrine  and Levophed  drips.   Recently hospitalized at [facility] 05/31/2023 through 06/08/2023 with new onset A-fib, influenza A, upper GI bleed with EGD showing duodenal ulcers.   Palliative Medicine has been consulted for goals of care.  Subjective: Chart reviewed. Update received from RN.  Epinephrine  and levophed  drips were turned off last night.  Patient assessed at bedside. He appears comfortable. Unresponsive to voice and light touch. No non-verbal signs of pain or discomfort noted. Respirations are even and unlabored. No excessive respiratory secretions noted.   I spoke with son/[name] by phone.  Reviewed natural trajectory at EOL. Emotional support provided.     Objective:  Physical Exam Vitals reviewed.  Constitutional:      General: He is not in acute distress.    Appearance: He is ill-appearing.  Pulmonary:     Effort: No respiratory distress.  Neurological:     Mental Status: He is unresponsive.             Palliative Medicine Assessment & Plan   Assessment: Principal Problem:   Shock (HCC)    Recommendations/Plan: Continue comfort care PRN  medications are available as per below for symptom management at end-of-life PMT will continue to support  Symptom Management:  Dilaudid  prn for pain or dyspnea Lorazepam  (ATIVAN ) prn for anxiety Haloperidol  (HALDOL ) prn for agitation  Glycopyrrolate  (ROBINUL ) for excessive secretions Ondansetron  (ZOFRAN ) prn for nausea Polyvinyl alcohol  (LIQUIFILM TEARS) prn for dry eyes Antiseptic oral rinse (BIOTENE) prn for dry mouth  Code Status: DNR - comfort   Prognosis:  Hours - Days  Discharge Planning: Anticipated Hospital Death   Thank you for allowing the Palliative Medicine Team to assist in  the care of this patient.   MDM - High  Detailed review of medical records (labs, imaging, vital signs), medically appropriate exam, discussed with treatment team, counseling and education to patient, family, & staff, documenting clinical information, medication management, coordination of care.    Fabio Wah B Sachiko Methot, NP   Please contact Palliative Medicine Team phone at [phone] for questions and concerns.  For individual providers, please see AMION.

## 2023-07-09 NOTE — Progress Notes (Signed)
   Plans for comfort care noted.  Not a device candidate.  See consult note from yesterday.  [facility] will sign off.  Please call with further questions.

## 2023-07-10 DIAGNOSIS — R001 Bradycardia, unspecified: Secondary | ICD-10-CM | POA: Diagnosis not present

## 2023-07-10 DIAGNOSIS — R579 Shock, unspecified: Secondary | ICD-10-CM | POA: Diagnosis not present

## 2023-07-10 DIAGNOSIS — Z515 Encounter for palliative care: Secondary | ICD-10-CM | POA: Diagnosis not present

## 2023-07-10 MED ORDER — GLYCOPYRROLATE 0.2 MG/ML IJ SOLN
0.4000 mg | INTRAMUSCULAR | Status: DC | PRN
  Administered 2023-07-11 – 2023-07-17 (×3): 0.4 mg via SUBCUTANEOUS
  Filled 2023-07-10 (×3): qty 2

## 2023-07-10 MED ORDER — BISACODYL 10 MG RE SUPP: 10.0000 mg | Freq: Every day | RECTAL | Status: DC | PRN

## 2023-07-10 MED ORDER — MORPHINE SULFATE (CONCENTRATE) 10 MG /0.5 ML PO SOLN
5.0000 mg | ORAL | Status: DC | PRN
  Administered 2023-07-10 – 2023-07-17 (×4): 5 mg via SUBLINGUAL
  Filled 2023-07-10 (×5): qty 0.5

## 2023-07-10 MED ORDER — LORAZEPAM 2 MG/ML PO CONC
1.0000 mg | ORAL | Status: DC | PRN
  Administered 2023-07-10: 2 mg via ORAL
  Filled 2023-07-10 (×3): qty 1

## 2023-07-10 NOTE — Plan of Care (Signed)

## 2023-07-10 NOTE — Progress Notes (Signed)
 Palliative Medicine Progress Note   Patient Name: [name]       Date: 07/10/2023 DOB: 01-Aug-1930  Age: 89 y.o. MRN#: [id number] Attending Physician: [place], Eric J, DO Primary Care Physician: Suzzanne Estrin, MD Admit Date: 07/08/2023   HPI/Patient Profile: 89 y.o. male  with past medical history of CAD/PCI, A-fib (not on AC due to high fall risk and recent UGIB), chronic thrombocytopenia, chronic anemia, CKD stage IIIb, moderate PCM, HTN, and dyslipidemia.  He presented to the ED on 07/08/2023 with shortness of breath and lethargy.  He was found to be bradycardic and hypotensive, given atropine  1 mg IV x 1.  Started on epinephrine  and Levophed  drips.   Recently hospitalized at [facility] 05/31/2023 through 06/08/2023 with new onset A-fib, influenza A, upper GI bleed with EGD showing duodenal ulcers.   Palliative Medicine has been consulted for goals of care.  Subjective: Medical records reviewed including progress notes, labs, imaging and MAR. Patient assessed at the bedside. He is resting comfortably with occasional jerking motions. His son and daughter in law are present visiting. Discussed with RN.  Provided education on expected signs and symptoms at EOL including blood pressure and temperature changes. Counseled on alterative options for symptom management given lack of IV access. Patient's family agreeable to PRN ativan  for restlessness. Counseled on option to continue titration and schedule SL medications if necessary. Provided [facility] booklet for additional review.  Questions and concerns addressed. PMT will continue to support holistically.     Objective:  Physical Exam Vitals and nursing note reviewed.  Constitutional:      General: He is not in acute distress.    Appearance: He is ill-appearing.  Pulmonary:      Effort: Pulmonary effort is normal. No respiratory distress.  Neurological:     Mental Status: He is unresponsive.           Palliative Medicine Assessment & Plan   Assessment: Principal Problem:   Shock (HCC)  EOL care  Recommendations/Plan: Continue comfort care Discussed with RN to provide SL medications for comfort. PRN medications are available as per below for symptom management at end-of-life PMT will continue to support  Symptom Management:  Morphine  SL prn for pain or dyspnea Lorazepam  (ATIVAN ) SL prn for anxiety Haloperidol  (HALDOL ) prn for agitation  Glycopyrrolate  (ROBINUL ) for excessive secretions Ondansetron  (ZOFRAN ) prn for nausea Polyvinyl alcohol  (LIQUIFILM TEARS) prn for dry eyes Antiseptic oral rinse (BIOTENE) prn for dry mouth  Code Status: DNR - comfort   Prognosis:  Hours - Days  Discharge Planning: Anticipated Hospital Death   Thank  you for allowing the Palliative Medicine Team to assist in the care of this patient.   MDM - High   Johne Buckle, PA-C Palliative Medicine Team Team phone # [phone]  Thank you for allowing the Palliative Medicine Team to assist in the care of this patient. Please utilize secure chat with additional questions, if there is no response within 30 minutes please call the above phone number.  Palliative Medicine Team providers are available by phone from 7am to 7pm daily and can be reached through the team cell phone.  Should this patient require assistance outside of these hours, please call the patient's attending physician.  Portions of this note are a verbal dictation therefore any spelling and/or grammatical errors are due to the "Dragon [employer] One" system interpretation.

## 2023-07-10 NOTE — Progress Notes (Signed)
 PROGRESS NOTE    [name]  ZOX:[id number] DOB: [birth year] DOA: 07/08/2023 PCP: Tisovec, Richard W, MD    Brief Narrative:   [name] is a 89 y.o. male with past medical history significant for HTN, CAD s/p PCI, dyslipidemia, paroxysmal atrial fibrillation (not on anticoagulation 2/2 high fall risk and recent UGIB), CKD3a, BPH, chronic anemia/thrombocytopenia, recent UGIB 2/2 duodenal ulcer, recent hospitalization for influenza A infection, sinus pause in which beta-blocker was discontinued, moderate protein calorie malnutrition, previous history of tobacco abuse who presented to [facility] ED from SNF for shortness of breath and lethargy.  On arrival patient was noted to be bradycardic, hypotensive and was given atropine  1 mg IV x 1, epinephrine  drip and Levophed  drip was started.  Cardiology was consulted.  PCCM was consulted for admission.  Significant Hospital events: 5/8: Admit ICU, cardiology and palliative care consulted 5/9: Son and daughter at bedside requesting transition to comfort care 5/10: Remains unresponsive, no acute distress  Assessment & Plan:   Cardiogenic shock Severe bradycardia/second-degree type I AVB Acute metabolic encephalopathy Acute renal failure on CKD stage IIIa Patient presenting from SNF with shortness of breath, lethargy.  Patient was noted to be in cardiogenic shock with severe bradycardia; EKG notable with HR 33 with secondary type I AVB.  Patient was seen by cardiology, not candidate for pacing/device implantation.  Patient initially received IV atropine  and started on epinephrine /Levophed  drip.  Initially admitted to the intensive care unit under the PCCM service.  Palliative care was consulted and family desired to transition to comfort measures. -- Dilaudid  prn for pain or dyspnea -- Lorazepam  (ATIVAN ) prn for anxiety -- Haloperidol  (HALDOL ) prn for agitation  -- Glycopyrrolate  (ROBINUL ) for excessive secretions -- Ondansetron  (ZOFRAN ) prn for nausea --  Polyvinyl alcohol  (LIQUIFILM TEARS) prn for dry eyes -- Antiseptic oral rinse (BIOTENE) prn for dry mouth   HTN CAD s/p PCI Dyslipidemia Atrial fibrillation not on anticoagulation Recent UGIB secondary to duodenal ulcer Anemia of chronic medical disease/chronic thrombocytopenia BPH Moderate protein calorie malnutrition History of distant tobacco use disorder    DVT prophylaxis: SCDs Start: 07/08/23 1441    Code Status: Do not attempt resuscitation (DNR) - Comfort care Family Communication:   Disposition Plan:  Level of care: Palliative Care Status is: Inpatient Remains inpatient appropriate because: Anticipate in-hospital death    Consultants:  Cardiology PCCM Palliative care  Procedures:  None  Antimicrobials:  None   Subjective: Patient seen examined bedside, lying in bed.  No family present.  Remains unresponsive.  No acute distress.  Transition to comfort measures yesterday by PCCM/palliative care.  No acute events overnight per nursing.  Objective: Vitals:   07/09/23 1147 07/09/23 1251 07/09/23 1431 07/10/23 0502  BP:    (!) 133/106  Pulse: 85   74  Resp: 12   20  Temp:    (!) 97.5 F (36.4 C)  TempSrc:      SpO2: 95%   94%  Weight:   84.8 kg   Height:  5\' 8"  (1.727 m)      Intake/Output Summary (Last 24 hours) at 07/10/2023 0956 Last data filed at 07/09/2023 1410 Gross per 24 hour  Intake --  Output 450 ml  Net -450 ml   Filed Weights   07/09/23 1431  Weight: 84.8 kg    Examination:  Physical Exam: GEN: NAD, unresponsive HEENT: NCAT PULM: CTAB  CV: Distant heart sounds, bradycardic GI: abd soft, non distended, + BS MSK: no peripheral edema   Data Reviewed: I  have personally reviewed following labs and imaging studies  CBC: Recent Labs  Lab 07/08/23 1222 07/08/23 1530  WBC 7.1  --   NEUTROABS 5.2  --   HGB 7.3* 7.1*  HCT 23.3* 21.0*  MCV 98.3  --   PLT 134*  --    Basic Metabolic Panel: Recent Labs  Lab 07/08/23 1222  07/08/23 1530 07/08/23 1553  NA 143 143  --   K 3.9 3.0*  --   CL 111  --   --   CO2 20*  --   --   GLUCOSE 94  --   --   BUN 69*  --   --   CREATININE 2.81*  --   --   CALCIUM  8.8*  --   --   MG  --   --  1.8   GFR: Estimated Creatinine Clearance: 18.2 mL/min (A) (by C-G formula based on SCr of 2.81 mg/dL (H)). Liver Function Tests: Recent Labs  Lab 07/08/23 1222  AST 45*  ALT 25  ALKPHOS 76  BILITOT 0.6  PROT 5.5*  ALBUMIN  2.5*   No results for input(s): "LIPASE", "AMYLASE" in the last 168 hours. No results for input(s): "AMMONIA" in the last 168 hours. Coagulation Profile: No results for input(s): "INR", "PROTIME" in the last 168 hours. Cardiac Enzymes: No results for input(s): "CKTOTAL", "CKMB", "CKMBINDEX", "TROPONINI" in the last 168 hours. BNP (last 3 results) No results for input(s): "PROBNP" in the last 8760 hours. HbA1C: No results for input(s): "HGBA1C" in the last 72 hours. CBG: Recent Labs  Lab 07/08/23 1944  GLUCAP 143*   Lipid Profile: No results for input(s): "CHOL", "HDL", "LDLCALC", "TRIG", "CHOLHDL", "LDLDIRECT" in the last 72 hours. Thyroid Function Tests: Recent Labs    07/08/23 1222 07/08/23 1553  TSH 3.629  --   FREET4  --  1.50*   Anemia Panel: Recent Labs    07/08/23 1553  TIBC 290  IRON 38*   Sepsis Labs: Recent Labs  Lab 07/08/23 1553  LATICACIDVEN 2.3*    No results found for this or any previous visit (from the past 240 hours).       Radiology Studies: DG Chest Portable 1 View Result Date: 07/08/2023 CLINICAL DATA:  Dyspnea. EXAM: PORTABLE CHEST 1 VIEW COMPARISON:  05/31/2023. FINDINGS: Low lung volumes. Cardiomediastinal silhouette is otherwise unchanged. Aortic atherosclerosis. Left basilar opacity with mild blunting of the left costophrenic angle. The right lung appears clear. No pneumothorax. Degenerative changes of the right glenohumeral joint. No acute osseous abnormality. IMPRESSION: Low lung volumes. Left  basilar opacity with mild blunting of the left costophrenic angle could reflect atelectasis or infiltrate with small left pleural effusion. Electronically Signed   By: Mannie Seek M.D.   On: 07/08/2023 12:54        Scheduled Meds: Continuous Infusions:   LOS: 2 days    Time spent: 40 minutes spent on 07/10/2023 caring for this patient face-to-face including chart review, ordering labs/tests, documenting, discussion with nursing staff, consultants, updating family and interview/physical exam    Rema Care [place], DO [facility] Available via Epic secure chat 7am-7pm After these hours, please refer to coverage provider listed on [email or web address] 07/10/2023, 9:56 AM

## 2023-07-11 DIAGNOSIS — R001 Bradycardia, unspecified: Secondary | ICD-10-CM | POA: Diagnosis not present

## 2023-07-11 DIAGNOSIS — Z515 Encounter for palliative care: Secondary | ICD-10-CM | POA: Diagnosis not present

## 2023-07-11 DIAGNOSIS — R579 Shock, unspecified: Secondary | ICD-10-CM | POA: Diagnosis not present

## 2023-07-11 NOTE — Plan of Care (Signed)
  Problem: Education: Goal: Knowledge of the prescribed therapeutic regimen will improve Outcome: Not Progressing   Problem: Coping: Goal: Ability to identify and develop effective coping behavior will improve Outcome: Not Progressing   Problem: Clinical Measurements: Goal: Quality of life will improve Outcome: Not Progressing   Problem: Respiratory: Goal: Verbalizations of increased ease of respirations will increase Outcome: Not Progressing   Problem: Role Relationship: Goal: Family's ability to cope with current situation will improve Outcome: Not Progressing Goal: Ability to verbalize concerns, feelings, and thoughts to partner or family member will improve Outcome: Not Progressing   Problem: Pain Management: Goal: Satisfaction with pain management regimen will improve Outcome: Not Progressing   Problem: Education: Goal: Knowledge of General Education information will improve Description: Including pain rating scale, medication(s)/side effects and non-pharmacologic comfort measures Outcome: Not Progressing   Problem: Clinical Measurements: Goal: Ability to maintain clinical measurements within normal limits will improve Outcome: Not Progressing Goal: Will remain free from infection Outcome: Not Progressing Goal: Diagnostic test results will improve Outcome: Not Progressing Goal: Respiratory complications will improve Outcome: Not Progressing Goal: Cardiovascular complication will be avoided Outcome: Not Progressing

## 2023-07-11 NOTE — Plan of Care (Signed)
  Problem: Education: Goal: Knowledge of the prescribed therapeutic regimen will improve Outcome: Progressing   Problem: Coping: Goal: Ability to identify and develop effective coping behavior will improve Outcome: Progressing   Problem: Clinical Measurements: Goal: Quality of life will improve Outcome: Progressing   Problem: Respiratory: Goal: Verbalizations of increased ease of respirations will increase Outcome: Progressing   Problem: Role Relationship: Goal: Family's ability to cope with current situation will improve Outcome: Progressing   

## 2023-07-11 NOTE — Progress Notes (Signed)
 PROGRESS NOTE    [name]  ZOX:[id number] DOB: 1930-03-19 DOA: 07/08/2023 PCP: Tisovec, Richard W, MD    Brief Narrative:   [name] is a 89 y.o. male with past medical history significant for HTN, CAD s/p PCI, dyslipidemia, paroxysmal atrial fibrillation (not on anticoagulation 2/2 high fall risk and recent UGIB), CKD3a, BPH, chronic anemia/thrombocytopenia, recent UGIB 2/2 duodenal ulcer, recent hospitalization for influenza A infection, sinus pause in which beta-blocker was discontinued, moderate protein calorie malnutrition, previous history of tobacco abuse who presented to [facility] ED from SNF for shortness of breath and lethargy.  On arrival patient was noted to be bradycardic, hypotensive and was given atropine  1 mg IV x 1, epinephrine  drip and Levophed  drip was started.  Cardiology was consulted.  PCCM was consulted for admission.  Significant Hospital events: 5/8: Admit ICU, cardiology and palliative care consulted 5/9: Son and daughter at bedside requesting transition to comfort care 5/10: Remains unresponsive, no acute distress  Assessment & Plan:   Cardiogenic shock Severe bradycardia/second-degree type I AVB Acute metabolic encephalopathy Acute renal failure on CKD stage IIIa Patient presenting from SNF with shortness of breath, lethargy.  Patient was noted to be in cardiogenic shock with severe bradycardia; EKG notable with HR 33 with secondary type I AVB.  Patient was seen by cardiology, not candidate for pacing/device implantation.  Patient initially received IV atropine  and started on epinephrine /Levophed  drip.  Initially admitted to the intensive care unit under the PCCM service.  Palliative care was consulted and family desired to transition to comfort measures. -- Dilaudid   0.5 -2mg  IV q2h PRN pain or dyspnea -- Lorazepam  PO/IV prn for anxiety -- Glycopyrrolate  [place] q4h PRN for excessive secretions -- Ondansetron  prn for nausea -- Polyvinyl alcohol   prn for dry  eyes   HTN CAD s/p PCI Dyslipidemia Atrial fibrillation not on anticoagulation Recent UGIB secondary to duodenal ulcer Anemia of chronic medical disease/chronic thrombocytopenia BPH Moderate protein calorie malnutrition History of distant tobacco use disorder    DVT prophylaxis: SCDs Start: 07/08/23 1441    Code Status: Do not attempt resuscitation (DNR) - Comfort care Family Communication: No family present at bedside this morning  Disposition Plan:  Level of care: Palliative Care Status is: Inpatient Remains inpatient appropriate because: Anticipate in-hospital death    Consultants:  Cardiology PCCM Palliative care  Procedures:  None  Antimicrobials:  None   Subjective: Patient seen examined bedside, lying in bed.  No family present.  Remains unresponsive.  No acute distress.  Seen by palliative care this morning in follow-up.  No acute events overnight per nursing staff.  Objective: Vitals:   07/09/23 1251 07/09/23 1431 07/10/23 0502 07/11/23 0500  BP:   (!) 133/106   Pulse:   74   Resp:   20   Temp:   (!) 97.5 F (36.4 C)   TempSrc:      SpO2:   94%   Weight:  84.8 kg  84 kg  Height: 5\' 8"  (1.727 m)       Intake/Output Summary (Last 24 hours) at 07/11/2023 0958 Last data filed at 07/11/2023 0852 Gross per 24 hour  Intake --  Output 800 ml  Net -800 ml   Filed Weights   07/09/23 1431 07/11/23 0500  Weight: 84.8 kg 84 kg    Examination:  Physical Exam: GEN: NAD, unresponsive HEENT: NCAT PULM: CTAB  CV: Distant heart sounds, bradycardic GI: abd soft, non distended, + BS MSK: no peripheral edema   Data Reviewed: I  have personally reviewed following labs and imaging studies  CBC: Recent Labs  Lab 07/08/23 1222 07/08/23 1530  WBC 7.1  --   NEUTROABS 5.2  --   HGB 7.3* 7.1*  HCT 23.3* 21.0*  MCV 98.3  --   PLT 134*  --    Basic Metabolic Panel: Recent Labs  Lab 07/08/23 1222 07/08/23 1530 07/08/23 1553  NA 143 143  --   K  3.9 3.0*  --   CL 111  --   --   CO2 20*  --   --   GLUCOSE 94  --   --   BUN 69*  --   --   CREATININE 2.81*  --   --   CALCIUM  8.8*  --   --   MG  --   --  1.8   GFR: Estimated Creatinine Clearance: 18.1 mL/min (A) (by C-G formula based on SCr of 2.81 mg/dL (H)). Liver Function Tests: Recent Labs  Lab 07/08/23 1222  AST 45*  ALT 25  ALKPHOS 76  BILITOT 0.6  PROT 5.5*  ALBUMIN  2.5*   No results for input(s): "LIPASE", "AMYLASE" in the last 168 hours. No results for input(s): "AMMONIA" in the last 168 hours. Coagulation Profile: No results for input(s): "INR", "PROTIME" in the last 168 hours. Cardiac Enzymes: No results for input(s): "CKTOTAL", "CKMB", "CKMBINDEX", "TROPONINI" in the last 168 hours. BNP (last 3 results) No results for input(s): "PROBNP" in the last 8760 hours. HbA1C: No results for input(s): "HGBA1C" in the last 72 hours. CBG: Recent Labs  Lab 07/08/23 1944  GLUCAP 143*   Lipid Profile: No results for input(s): "CHOL", "HDL", "LDLCALC", "TRIG", "CHOLHDL", "LDLDIRECT" in the last 72 hours. Thyroid Function Tests: Recent Labs    07/08/23 1222 07/08/23 1553  TSH 3.629  --   FREET4  --  1.50*   Anemia Panel: Recent Labs    07/08/23 1553  TIBC 290  IRON 38*   Sepsis Labs: Recent Labs  Lab 07/08/23 1553  LATICACIDVEN 2.3*    No results found for this or any previous visit (from the past 240 hours).       Radiology Studies: No results found.       Scheduled Meds: Continuous Infusions:   LOS: 3 days    Time spent: 35 minutes spent on 07/11/2023 caring for this patient face-to-face including chart review, ordering labs/tests, documenting, discussion with nursing staff, consultants, updating family and interview/physical exam    Rema Care [place], DO [facility] Available via Epic secure chat 7am-7pm After these hours, please refer to coverage provider listed on [email or web address] 07/11/2023, 9:58 AM

## 2023-07-11 NOTE — Progress Notes (Signed)
 Rotated Pt throughout the night, responsive to stimuli. Pt didn't seem to be in any pain from this, no comfort meds were given.

## 2023-07-11 NOTE — Progress Notes (Signed)
 Palliative Medicine Progress Note   Patient Name: [name]       Date: 07/11/2023 DOB: 1930/09/05  Age: 89 y.o. MRN#: [id number] Attending Physician: [place], Eric J, DO Primary Care Physician: Suzzanne Estrin, MD Admit Date: 07/08/2023   HPI/Patient Profile: 89 y.o. male  with past medical history of CAD/PCI, A-fib (not on AC due to high fall risk and recent UGIB), chronic thrombocytopenia, chronic anemia, CKD stage IIIb, moderate PCM, HTN, and dyslipidemia.  He presented to the ED on 07/08/2023 with shortness of breath and lethargy.  He was found to be bradycardic and hypotensive, given atropine  1 mg IV x 1.  Started on epinephrine  and Levophed  drips.   Recently hospitalized at [facility] 05/31/2023 through 06/08/2023 with new onset A-fib, influenza A, upper GI bleed with EGD showing duodenal ulcers.   Palliative Medicine has been consulted for goals of care.  Subjective: Medical records reviewed including progress notes, labs, imaging and MAR.  No sublingual/IV opioids, benzodiazepines, or other comfort medications required in the past 24 hours.  Patient assessed at the bedside.  He is resting comfortably without signs of pain or distress, though mildly tachypneic.  No family was present during my assessment.  Questions and concerns addressed. PMT will continue to support holistically.     Objective:  Physical Exam Vitals and nursing note reviewed.  Constitutional:      General: He is not in acute distress.    Appearance: He is ill-appearing.  Pulmonary:     Effort: Pulmonary effort is normal. No respiratory distress.  Neurological:     Mental Status: He is unresponsive.           Palliative Medicine Assessment & Plan   Assessment: Principal Problem:   Shock (HCC)  EOL care  Recommendations/Plan: Continue comfort care No  adjustments required to comfort medications today; continue as below with option to replace IV and use IV medications if comfort cannot be achieved with sublingual medications  PMT will continue to support  Symptom Management:  Morphine  SL prn for pain or dyspnea Dilaudid  IV as needed for pain or dyspnea Lorazepam  (ATIVAN ) SL/IV prn for anxiety Glycopyrrolate  (ROBINUL ) for excessive secretions Ondansetron  (ZOFRAN ) prn for nausea Polyvinyl alcohol  (LIQUIFILM TEARS) prn for dry eyes Antiseptic oral rinse (BIOTENE) prn for dry mouth  Code Status: DNR - comfort   Prognosis:  Hours - Days  Discharge Planning: Anticipated Hospital Death   Thank you for allowing the Palliative Medicine Team to assist in the care of this patient.   Cameo Shewell, PA-C Palliative Medicine Team Team phone # [phone]  Thank you  for allowing the Palliative Medicine Team to assist in the care of this patient. Please utilize secure chat with additional questions, if there is no response within 30 minutes please call the above phone number.  Palliative Medicine Team providers are available by phone from 7am to 7pm daily and can be reached through the team cell phone.  Should this patient require assistance outside of these hours, please call the patient's attending physician.  Portions of this note are a verbal dictation therefore any spelling and/or grammatical errors are due to the "[employer]agon [employer] One" system interpretation.

## 2023-07-12 DIAGNOSIS — Z66 Do not resuscitate: Secondary | ICD-10-CM | POA: Diagnosis not present

## 2023-07-12 DIAGNOSIS — R001 Bradycardia, unspecified: Secondary | ICD-10-CM | POA: Diagnosis not present

## 2023-07-12 DIAGNOSIS — R579 Shock, unspecified: Secondary | ICD-10-CM | POA: Diagnosis not present

## 2023-07-12 DIAGNOSIS — Z515 Encounter for palliative care: Secondary | ICD-10-CM | POA: Diagnosis not present

## 2023-07-12 MED ORDER — ORAL CARE MOUTH RINSE
15.0000 mL | OROMUCOSAL | Status: DC
  Administered 2023-07-12 – 2023-07-17 (×22): 15 mL via OROMUCOSAL

## 2023-07-12 MED ORDER — ORAL CARE MOUTH RINSE: 15.0000 mL | OROMUCOSAL | Status: DC | PRN

## 2023-07-12 NOTE — TOC CM/SW Note (Signed)
 Per attending and palliative care notes anticipated hospital death     Transition of Care Department [facility]) has reviewed patient and no TOC needs have been identified at this time. We will continue to monitor patient advancement through interdisciplinary progression rounds. If new patient transition needs arise, please place a TOC consult.

## 2023-07-12 NOTE — Progress Notes (Signed)
 PROGRESS NOTE    [name]  ZHY:[id number] DOB: 1930-07-13 DOA: 07/08/2023 PCP: Tisovec, Richard W, MD    Brief Narrative:   [name] is a 89 y.o. male with past medical history significant for HTN, CAD s/p PCI, dyslipidemia, paroxysmal atrial fibrillation (not on anticoagulation 2/2 high fall risk and recent UGIB), CKD3a, BPH, chronic anemia/thrombocytopenia, recent UGIB 2/2 duodenal ulcer, recent hospitalization for influenza A infection, sinus pause in which beta-blocker was discontinued, moderate protein calorie malnutrition, previous history of tobacco abuse who presented to [facility] ED from SNF for shortness of breath and lethargy.  On arrival patient was noted to be bradycardic, hypotensive and was given atropine  1 mg IV x 1, epinephrine  drip and Levophed  drip was started.  Cardiology was consulted.  PCCM was consulted for admission.  Significant Hospital events: 5/8: Admit ICU, cardiology and palliative care consulted 5/9: Son and daughter at bedside requesting transition to comfort care 5/10: Remains unresponsive, no acute distress  Assessment & Plan:   Cardiogenic shock Severe bradycardia/second-degree type I AVB Acute metabolic encephalopathy Acute renal failure on CKD stage IIIa Patient presenting from SNF with shortness of breath, lethargy.  Patient was noted to be in cardiogenic shock with severe bradycardia; EKG notable with HR 33 with secondary type I AVB.  Patient was seen by cardiology, not candidate for pacing/device implantation.  Patient initially received IV atropine  and started on epinephrine /Levophed  drip.  Initially admitted to the intensive care unit under the PCCM service.  Palliative care was consulted and family desired to transition to comfort measures. -- Dilaudid   0.5 -2mg  IV q2h PRN pain or dyspnea -- Lorazepam  PO/IV prn for anxiety -- Glycopyrrolate  [place] q4h PRN for excessive secretions -- Ondansetron  prn for nausea -- Polyvinyl alcohol   prn for dry  eyes   HTN CAD s/p PCI Dyslipidemia Atrial fibrillation not on anticoagulation Recent UGIB secondary to duodenal ulcer Anemia of chronic medical disease/chronic thrombocytopenia BPH Moderate protein calorie malnutrition History of distant tobacco use disorder    DVT prophylaxis: SCDs Start: 07/08/23 1441    Code Status: Do not attempt resuscitation (DNR) - Comfort care Family Communication: No family present at bedside this morning  Disposition Plan:  Level of care: Palliative Care Status is: Inpatient Remains inpatient appropriate because: Anticipate in-hospital death    Consultants:  Cardiology PCCM Palliative care  Procedures:  None  Antimicrobials:  None   Subjective: Patient seen examined bedside, lying in bed.  No family present.  Opens eyes, mumbling speech.  No acute distress.    No acute events overnight per nursing staff.  Objective: Vitals:   07/11/23 0500 07/11/23 1247 07/12/23 0443 07/12/23 0448  BP:  (!) 136/54 (!) 131/48   Pulse:  78 71   Resp:  16 19   Temp:   98.1 F (36.7 C)   TempSrc:   Oral   SpO2:  100% 99%   Weight: 84 kg   83.8 kg  Height:        Intake/Output Summary (Last 24 hours) at 07/12/2023 0954 Last data filed at 07/12/2023 0700 Gross per 24 hour  Intake 0 ml  Output 600 ml  Net -600 ml   Filed Weights   07/09/23 1431 07/11/23 0500 07/12/23 0448  Weight: 84.8 kg 84 kg 83.8 kg    Examination:  Physical Exam: GEN: NAD, opens eyes, mumbling speech HEENT: NCAT PULM: CTAB  CV: Distant heart sounds, bradycardic GI: abd soft, non distended, + BS MSK: no peripheral edema   Data Reviewed: I  have personally reviewed following labs and imaging studies  CBC: Recent Labs  Lab 07/08/23 1222 07/08/23 1530  WBC 7.1  --   NEUTROABS 5.2  --   HGB 7.3* 7.1*  HCT 23.3* 21.0*  MCV 98.3  --   PLT 134*  --    Basic Metabolic Panel: Recent Labs  Lab 07/08/23 1222 07/08/23 1530 07/08/23 1553  NA 143 143  --   K 3.9  3.0*  --   CL 111  --   --   CO2 20*  --   --   GLUCOSE 94  --   --   BUN 69*  --   --   CREATININE 2.81*  --   --   CALCIUM  8.8*  --   --   MG  --   --  1.8   GFR: Estimated Creatinine Clearance: 18.1 mL/min (A) (by C-G formula based on SCr of 2.81 mg/dL (H)). Liver Function Tests: Recent Labs  Lab 07/08/23 1222  AST 45*  ALT 25  ALKPHOS 76  BILITOT 0.6  PROT 5.5*  ALBUMIN  2.5*   No results for input(s): "LIPASE", "AMYLASE" in the last 168 hours. No results for input(s): "AMMONIA" in the last 168 hours. Coagulation Profile: No results for input(s): "INR", "PROTIME" in the last 168 hours. Cardiac Enzymes: No results for input(s): "CKTOTAL", "CKMB", "CKMBINDEX", "TROPONINI" in the last 168 hours. BNP (last 3 results) No results for input(s): "PROBNP" in the last 8760 hours. HbA1C: No results for input(s): "HGBA1C" in the last 72 hours. CBG: Recent Labs  Lab 07/08/23 1944  GLUCAP 143*   Lipid Profile: No results for input(s): "CHOL", "HDL", "LDLCALC", "TRIG", "CHOLHDL", "LDLDIRECT" in the last 72 hours. Thyroid Function Tests: No results for input(s): "TSH", "T4TOTAL", "FREET4", "T3FREE", "THYROIDAB" in the last 72 hours.  Anemia Panel: No results for input(s): "VITAMINB12", "FOLATE", "FERRITIN", "TIBC", "IRON", "RETICCTPCT" in the last 72 hours.  Sepsis Labs: Recent Labs  Lab 07/08/23 1553  LATICACIDVEN 2.3*    No results found for this or any previous visit (from the past 240 hours).       Radiology Studies: No results found.       Scheduled Meds:  mouth rinse  15 mL Mouth Rinse 4 times per day   Continuous Infusions:   LOS: 4 days    Time spent: 35 minutes spent on 07/12/2023 caring for this patient face-to-face including chart review, ordering labs/tests, documenting, discussion with nursing staff, consultants, updating family and interview/physical exam    Rema Care [place], DO [facility] Available via Epic secure chat  7am-7pm After these hours, please refer to coverage provider listed on [email or web address] 07/12/2023, 9:54 AM

## 2023-07-12 NOTE — Progress Notes (Signed)
 Palliative Medicine Progress Note   Patient Name: [name]       Date: 07/12/2023 DOB: 22-May-1930  Age: 89 y.o. MRN#: [id number] Attending Physician: [place], Eric J, DO Primary Care Physician: Suzzanne Estrin, MD Admit Date: 07/08/2023  Reason for Consultation/Follow-up: {Reason for Consult:23484}  HPI/Patient Profile: 89 y.o. male  with past medical history of CAD/PCI, A-fib (not on AC due to high fall risk and recent UGIB), chronic thrombocytopenia, chronic anemia, CKD stage IIIb, moderate PCM, HTN, and dyslipidemia.  He presented to the ED on 07/08/2023 with shortness of breath and lethargy.  He was found to be bradycardic and hypotensive, given atropine  1 mg IV x 1.  Started on epinephrine  and Levophed  drips.   Recently hospitalized at [facility] 05/31/2023 through 06/08/2023 with new onset A-fib, influenza A, upper GI bleed with EGD showing duodenal ulcers.   Palliative Medicine has been consulted for goals of care.  Subjective: ***  Objective:  Physical Exam          Vital Signs: BP (!) 131/48 (BP Location: Right Arm)   Pulse 71   Temp 98.1 F (36.7 C) (Oral)   Resp 19   Ht 5\' 8"  (1.727 m)   Wt 83.8 kg   SpO2 99%   BMI 28.09 kg/m  SpO2: SpO2: 99 % O2 Device: O2 Device: Room Air O2 Flow Rate: O2 Flow Rate (L/min): 2 L/min  Intake/output summary:  Intake/Output Summary (Last 24 hours) at 07/12/2023 1217 Last data filed at 07/12/2023 0700 Gross per 24 hour  Intake 0 ml  Output 600 ml  Net -600 ml    LBM: Last BM Date :  (UTA)     Palliative Assessment/Data: ***     Palliative Medicine Assessment & Plan   Assessment: Principal Problem:   Shock (HCC)    Recommendations/Plan: ***  Goals of Care and Additional Recommendations: Limitations on Scope of Treatment:  {Recommended Scope and Preferences:21019}  Code Status:   Prognosis:  {Palliative Care Prognosis:23504}  Discharge Planning: {Palliative dispostion:23505}  Care plan was discussed with ***  Thank you for allowing the Palliative Medicine Team to assist in the care of this patient.   ***   Wynetta Heckle, NP   Please contact Palliative Medicine Team phone at [phone] for questions and concerns.  For individual providers, please see AMION.

## 2023-07-12 NOTE — Plan of Care (Signed)
°  Problem: Education: Goal: Knowledge of the prescribed therapeutic regimen will improve Outcome: Not Progressing   Problem: Coping: Goal: Ability to identify and develop effective coping behavior will improve Outcome: Not Progressing   Problem: Clinical Measurements: Goal: Quality of life will improve Outcome: Not Progressing   Problem: Respiratory: Goal: Verbalizations of increased ease of respirations will increase Outcome: Not Progressing   Problem: Role Relationship: Goal: Family's ability to cope with current situation will improve Outcome: Not Progressing Goal: Ability to verbalize concerns, feelings, and thoughts to partner or family member will improve Outcome: Not Progressing   Problem: Pain Management: Goal: Satisfaction with pain management regimen will improve Outcome: Not Progressing

## 2023-07-12 NOTE — Care Management Important Message (Signed)
 Important Message  Patient Details  Name: [name] MRN: [id number] Date of Birth: 08-11-1930   Important Message Given:  No     Felix Host 07/12/2023, 2:27 PM

## 2023-07-12 NOTE — Progress Notes (Signed)
 5/12 IMM Letter respectfully not given, patient under Comfort Measures.

## 2023-07-13 DIAGNOSIS — R579 Shock, unspecified: Secondary | ICD-10-CM | POA: Diagnosis not present

## 2023-07-13 DIAGNOSIS — R001 Bradycardia, unspecified: Secondary | ICD-10-CM | POA: Diagnosis not present

## 2023-07-13 DIAGNOSIS — Z515 Encounter for palliative care: Secondary | ICD-10-CM | POA: Diagnosis not present

## 2023-07-13 NOTE — Plan of Care (Signed)

## 2023-07-13 NOTE — Progress Notes (Signed)
 PROGRESS NOTE    [name]  NFA:[id number] DOB: 1930-06-06 DOA: 07/08/2023 PCP: Tisovec, Richard W, MD    Brief Narrative:   [name] is a 89 y.o. male with past medical history significant for HTN, CAD s/p PCI, dyslipidemia, paroxysmal atrial fibrillation (not on anticoagulation 2/2 high fall risk and recent UGIB), CKD3a, BPH, chronic anemia/thrombocytopenia, recent UGIB 2/2 duodenal ulcer, recent hospitalization for influenza A infection, sinus pause in which beta-blocker was discontinued, moderate protein calorie malnutrition, previous history of tobacco abuse who presented to [facility] ED from SNF for shortness of breath and lethargy.  On arrival patient was noted to be bradycardic, hypotensive and was given atropine  1 mg IV x 1, epinephrine  drip and Levophed  drip was started.  Cardiology was consulted.  PCCM was consulted for admission.  Significant Hospital events: 5/8: Admit ICU, cardiology and palliative care consulted 5/9: Son and daughter at bedside requesting transition to comfort care 5/10: Remains unresponsive, no acute distress 5/13: Opens eyes slightly, moving left hand, otherwise minimally responsive  Assessment & Plan:   Cardiogenic shock Severe bradycardia/second-degree type I AVB Acute metabolic encephalopathy Acute renal failure on CKD stage IIIa Patient presenting from SNF with shortness of breath, lethargy.  Patient was noted to be in cardiogenic shock with severe bradycardia; EKG notable with HR 33 with secondary type I AVB.  Patient was seen by cardiology, not candidate for pacing/device implantation.  Patient initially received IV atropine  and started on epinephrine /Levophed  drip.  Initially admitted to the intensive care unit under the PCCM service.  Palliative care was consulted and family desired to transition to comfort measures. -- Dilaudid   0.5 -2mg  IV q2h PRN pain or dyspnea -- Lorazepam  PO/IV prn for anxiety -- Glycopyrrolate  [place] q4h PRN for excessive  secretions -- Ondansetron  prn for nausea -- Polyvinyl alcohol   prn for dry eyes   HTN CAD s/p PCI Dyslipidemia Atrial fibrillation not on anticoagulation Recent UGIB secondary to duodenal ulcer Anemia of chronic medical disease/chronic thrombocytopenia BPH Moderate protein calorie malnutrition History of distant tobacco use disorder    DVT prophylaxis: SCDs Start: 07/08/23 1441    Code Status: Do not attempt resuscitation (DNR) - Comfort care Family Communication: No family present at bedside this morning  Disposition Plan:  Level of care: Palliative Care Status is: Inpatient Remains inpatient appropriate because: Anticipate in-hospital death    Consultants:  Cardiology PCCM Palliative care  Procedures:  None  Antimicrobials:  None   Subjective: Patient seen examined bedside, lying in bed.  No family present.  Opens eyes, moving left hand otherwise minimally responsive.  No acute distress.    No acute events overnight per nursing staff.  Objective: Vitals:   07/12/23 0443 07/12/23 0448 07/13/23 0539 07/13/23 0741  BP: (!) 131/48  128/60   Pulse: 71  79   Resp: 19  16   Temp: 98.1 F (36.7 C)     TempSrc: Oral     SpO2: 99%  99%   Weight:  83.8 kg  83 kg  Height:        Intake/Output Summary (Last 24 hours) at 07/13/2023 1243 Last data filed at 07/13/2023 1241 Gross per 24 hour  Intake 0 ml  Output 750 ml  Net -750 ml   Filed Weights   07/11/23 0500 07/12/23 0448 07/13/23 0741  Weight: 84 kg 83.8 kg 83 kg    Examination:  Physical Exam: GEN: NAD, opens eyes HEENT: NCAT PULM: CTAB  CV: Distant heart sounds, bradycardic GI: abd soft, non distended, +  BS MSK: no peripheral edema   Data Reviewed: I have personally reviewed following labs and imaging studies  CBC: Recent Labs  Lab 07/08/23 1222 07/08/23 1530  WBC 7.1  --   NEUTROABS 5.2  --   HGB 7.3* 7.1*  HCT 23.3* 21.0*  MCV 98.3  --   PLT 134*  --    Basic Metabolic  Panel: Recent Labs  Lab 07/08/23 1222 07/08/23 1530 07/08/23 1553  NA 143 143  --   K 3.9 3.0*  --   CL 111  --   --   CO2 20*  --   --   GLUCOSE 94  --   --   BUN 69*  --   --   CREATININE 2.81*  --   --   CALCIUM  8.8*  --   --   MG  --   --  1.8   GFR: Estimated Creatinine Clearance: 18 mL/min (A) (by C-G formula based on SCr of 2.81 mg/dL (H)). Liver Function Tests: Recent Labs  Lab 07/08/23 1222  AST 45*  ALT 25  ALKPHOS 76  BILITOT 0.6  PROT 5.5*  ALBUMIN  2.5*   No results for input(s): "LIPASE", "AMYLASE" in the last 168 hours. No results for input(s): "AMMONIA" in the last 168 hours. Coagulation Profile: No results for input(s): "INR", "PROTIME" in the last 168 hours. Cardiac Enzymes: No results for input(s): "CKTOTAL", "CKMB", "CKMBINDEX", "TROPONINI" in the last 168 hours. BNP (last 3 results) No results for input(s): "PROBNP" in the last 8760 hours. HbA1C: No results for input(s): "HGBA1C" in the last 72 hours. CBG: Recent Labs  Lab 07/08/23 1944  GLUCAP 143*   Lipid Profile: No results for input(s): "CHOL", "HDL", "LDLCALC", "TRIG", "CHOLHDL", "LDLDIRECT" in the last 72 hours. Thyroid Function Tests: No results for input(s): "TSH", "T4TOTAL", "FREET4", "T3FREE", "THYROIDAB" in the last 72 hours.  Anemia Panel: No results for input(s): "VITAMINB12", "FOLATE", "FERRITIN", "TIBC", "IRON", "RETICCTPCT" in the last 72 hours.  Sepsis Labs: Recent Labs  Lab 07/08/23 1553  LATICACIDVEN 2.3*    No results found for this or any previous visit (from the past 240 hours).       Radiology Studies: No results found.       Scheduled Meds:  mouth rinse  15 mL Mouth Rinse 4 times per day   Continuous Infusions:   LOS: 5 days    Time spent: 35 minutes spent on 07/13/2023 caring for this patient face-to-face including chart review, ordering labs/tests, documenting, discussion with nursing staff, consultants, updating family and  interview/physical exam    Rema Care [place], DO [facility] Available via Epic secure chat 7am-7pm After these hours, please refer to coverage provider listed on [email or web address] 07/13/2023, 12:43 PM

## 2023-07-13 NOTE — Progress Notes (Incomplete)
 Palliative Medicine Progress Note   Patient Name: [name]       Date: 07/13/2023 DOB: Dec 26, 1930  Age: 89 y.o. MRN#: [id number] Attending Physician: [place], Eric J, DO Primary Care Physician: Suzzanne Estrin, MD Admit Date: 07/08/2023  Reason for Consultation/Follow-up: {Reason for Consult:23484}  HPI/Patient Profile: 89 y.o. male  with past medical history of CAD/PCI, A-fib (not on AC due to high fall risk and recent UGIB), chronic thrombocytopenia, chronic anemia, CKD stage IIIb, moderate PCM, HTN, and dyslipidemia. Recently hospitalized at [facility] 05/31/2023 through 06/08/2023 with new onset A-fib, influenza A, upper GI bleed with EGD showing duodenal ulcers.  He presented to the ED on 07/08/2023 with shortness of breath and lethargy.  He was found to be bradycardic and hypotensive, given atropine  1 mg IV x 1.  Started on epinephrine  and Levophed  drips in the ED.   Palliative Medicine has been consulted for goals of care. After discussion with family, patient was transitioned to comfort care on 5/8.   Subjective: Chart reviewed. Patient appears comfortable. He is minimally responsive. No non-verbal signs of pain or discomfort noted. Respirations are even and unlabored. No excessive respiratory secretions noted.    Family/son present at bedside. Reviewed natural trajectory at end-of-life. Emotional support provided.   Objective:  Physical Exam Vitals reviewed.  Constitutional:      Appearance: He is ill-appearing.  Pulmonary:     Effort: Pulmonary effort is normal.  Skin:    General: Skin is warm and dry.  Neurological:     Mental Status: He is unresponsive.              Palliative Medicine Assessment & Plan   Assessment: Principal Problem:   Shock (HCC)     Recommendations/Plan: Continue comfort care PRN medications are available as per below for symptom management at end-of-life PMT will continue to support   Symptom Management:  Morphine  SL prn for pain or dyspnea Dilaudid  IV as needed for pain or dyspnea Lorazepam  (ATIVAN ) SL/IV prn for anxiety Glycopyrrolate  (ROBINUL ) for excessive secretions Ondansetron  (ZOFRAN ) prn for nausea Polyvinyl alcohol  (LIQUIFILM TEARS) prn for dry eyes Antiseptic oral rinse (BIOTENE) prn for dry mouth  Goals of Care and Additional Recommendations: Limitations on Scope of Treatment: {Recommended Scope and Preferences:21019}  Code Status:   Prognosis:  {Palliative Care Prognosis:23504}  Discharge Planning: {  Palliative dispostion:23505}  Care plan was discussed with ***  Thank you for allowing the Palliative Medicine Team to assist in the care of this patient.   ***   Wynetta Heckle, NP   Please contact Palliative Medicine Team phone at [phone] for questions and concerns.  For individual providers, please see AMION.    '

## 2023-07-14 DIAGNOSIS — R001 Bradycardia, unspecified: Secondary | ICD-10-CM | POA: Diagnosis not present

## 2023-07-14 DIAGNOSIS — Z515 Encounter for palliative care: Secondary | ICD-10-CM

## 2023-07-14 DIAGNOSIS — R579 Shock, unspecified: Secondary | ICD-10-CM | POA: Diagnosis not present

## 2023-07-14 DIAGNOSIS — Z66 Do not resuscitate: Secondary | ICD-10-CM | POA: Diagnosis not present

## 2023-07-14 MED ORDER — MORPHINE SULFATE (CONCENTRATE) 10 MG /0.5 ML PO SOLN
5.0000 mg | Freq: Four times a day (QID) | ORAL | Status: DC
  Administered 2023-07-14 – 2023-07-17 (×14): 5 mg via SUBLINGUAL
  Filled 2023-07-14 (×15): qty 0.5

## 2023-07-14 NOTE — Plan of Care (Signed)

## 2023-07-14 NOTE — Progress Notes (Signed)
 PROGRESS NOTE    Nilo Kalscheur  ZOX:[id number] DOB: [birth year] DOA: 07/08/2023 PCP: Tisovec, Richard W, MD   Brief Narrative:  [name] is a 89 y.o. male with past medical history significant for HTN, CAD s/p PCI, dyslipidemia, paroxysmal atrial fibrillation (not on anticoagulation 2/2 high fall risk and recent UGIB), CKD3a, BPH, chronic anemia/thrombocytopenia, recent UGIB 2/2 duodenal ulcer, recent hospitalization for influenza A infection, sinus pause in which beta-blocker was discontinued, moderate protein calorie malnutrition, previous history of tobacco abuse who presented to [facility] ED from SNF for shortness of breath and lethargy.  On arrival patient was noted to be bradycardic, hypotensive and was given atropine  1 mg IV x 1, epinephrine  drip and Levophed  drip was started.  Cardiology was consulted.  PCCM was consulted for admission.   Significant Hospital events: 5/8: Admit ICU, cardiology and palliative care consulted 5/9: Son and daughter at bedside requesting transition to comfort care 5/10: Remains unresponsive, no acute distress 5/13: Opens eyes slightly, moving left hand, otherwise minimally responsive 5/14: Continues to remain minimally responsive. Does open eyes and withdraw to pain   Assessment and Plan:   Cardiogenic shock with Lactic Acidosis Severe bradycardia/second-degree type I AVB Acute metabolic encephalopathy Acute renal failure on CKD stage IIIa Patient presenting from SNF with shortness of breath, lethargy.  Patient was noted to be in cardiogenic shock with severe bradycardia; EKG notable with HR 33 with secondary type I AVB.  Patient was seen by Cardiology, not candidate for pacing/device implantation.  Patient initially received IV atropine  and started on epinephrine /Levophed  drip.  Initially admitted to the intensive care unit under the PCCM service.  Palliative care was consulted and family desired to transition to comfort measures. Currently NPO except Meds and Sips  All medications not in the patient's comfort discontinued. Currently has the following Comfort Medications  -IV Hydromorphone  0.5-2 mg IV q2h PRN Moderate/Severe Pain or dyspnea -Lorazepam  1-2 mg PO/IV q4prn for anxiety -Glycopyrrolate  0.4 mg [place] q4h PRN for excessive secretions -Ondansetron  4 mg po/IV q6prn for nausea -Polyvinyl alcohol   prn for dry eyes and Biotene 15 mL Topically prn Dry mouth -Also has morphine  Concentrate 5 mg SL q2hprn Severe Pain    Essential HTN CAD s/p PCI Dyslipidemia Atrial fibrillation not on anticoagulation Recent UGIB secondary to duodenal ulcer Anemia of chronic medical disease/Iron Deficiency/Chronic Thrombocytopenia BPH Moderate protein calorie malnutrition History of distant tobacco use disorder Hypokalemia  Hypoalbuminemia: Patient's Albumin  was 2.5. Will not CTM and Trend as patient is CMO  Overweight: Complicates overall prognosis and care. Estimated body mass index is 27.52 kg/m as calculated from the following:   Height as of this encounter: 5\' 8"  (1.727 m).   Weight as of this encounter: 82.1 kg. Weight Loss and Dietary Counseling given   DVT prophylaxis: SCDs Start: 07/08/23 1441    Code Status: Do not attempt resuscitation (DNR) - Comfort care Family Communication: No family present @ bedside   Disposition Plan:  Level of care: Palliative Care Status is: Inpatient Remains inpatient appropriate because: Has a high risk for further decompensation and anticipated Hospital Death    Consultants:  Cardiology PCCM Palliative care  Procedures:  As delineated as above  Antimicrobials:  Anti-infectives (From admission, onward)    None      Subjective: Seen and examined at bedside and is minimally responsive.  Briefly opens his eyes a little bit in response to pain.  Does not have any signs of discomfort at all at this time and appears fairly comfortable.  Has a high risk of further decompensation and likely will have an in-hospital  death  Objective: Vitals:   07-21-23 0539 07-21-23 0741 07/14/23 0500 07/14/23 1101  BP: 128/60   (!) 149/59  Pulse: 79   85  Resp: 16   18  Temp:    (!) 97.3 F (36.3 C)  TempSrc:    Oral  SpO2: 99%   97%  Weight:  83 kg 82.1 kg   Height:        Intake/Output Summary (Last 24 hours) at 07/14/2023 1123 Last data filed at 07/14/2023 0300 Gross per 24 hour  Intake 6 ml  Output 700 ml  Net -694 ml   Filed Weights   07/12/23 0448 21-Jul-2023 0741 07/14/23 0500  Weight: 83.8 kg 83 kg 82.1 kg   Examination: Physical Exam:  Constitutional: Elderly overweight chronically ill-appearing Caucasian male who appears calm and appears comfortable Respiratory: Diminished to auscultation bilaterally, no wheezing, rales, rhonchi or crackles. Normal respiratory effort and patient is not tachypenic. No accessory muscle use.  Cardiovascular: Bradycardic and has somewhat distant Heart Sounds, No appreciable extremity edema.  Abdomen: Soft, non-tender, Mildly distended 2/2 body habitus. Bowel sounds positive.  GU: Deferred. Neurologic: Withdraws to Pain and opens his eyes briefly Psychiatric: Somnolent and drowsy appearing and not as alert  Data Reviewed: I have personally reviewed following labs and imaging studies  CBC: Recent Labs  Lab 07/08/23 1222 07/08/23 1530  WBC 7.1  --   NEUTROABS 5.2  --   HGB 7.3* 7.1*  HCT 23.3* 21.0*  MCV 98.3  --   PLT 134*  --    Basic Metabolic Panel: Recent Labs  Lab 07/08/23 1222 07/08/23 1530 07/08/23 1553  NA 143 143  --   K 3.9 3.0*  --   CL 111  --   --   CO2 20*  --   --   GLUCOSE 94  --   --   BUN 69*  --   --   CREATININE 2.81*  --   --   CALCIUM  8.8*  --   --   MG  --   --  1.8   GFR: Estimated Creatinine Clearance: 17.9 mL/min (A) (by C-G formula based on SCr of 2.81 mg/dL (H)). Liver Function Tests: Recent Labs  Lab 07/08/23 1222  AST 45*  ALT 25  ALKPHOS 76  BILITOT 0.6  PROT 5.5*  ALBUMIN  2.5*   No results for  input(s): "LIPASE", "AMYLASE" in the last 168 hours. No results for input(s): "AMMONIA" in the last 168 hours. Coagulation Profile: No results for input(s): "INR", "PROTIME" in the last 168 hours. Cardiac Enzymes: No results for input(s): "CKTOTAL", "CKMB", "CKMBINDEX", "TROPONINI" in the last 168 hours. BNP (last 3 results) No results for input(s): "PROBNP" in the last 8760 hours. HbA1C: No results for input(s): "HGBA1C" in the last 72 hours. CBG: Recent Labs  Lab 07/08/23 1944  GLUCAP 143*   Lipid Profile: No results for input(s): "CHOL", "HDL", "LDLCALC", "TRIG", "CHOLHDL", "LDLDIRECT" in the last 72 hours. Thyroid Function Tests: No results for input(s): "TSH", "T4TOTAL", "FREET4", "T3FREE", "THYROIDAB" in the last 72 hours. Anemia Panel: No results for input(s): "VITAMINB12", "FOLATE", "FERRITIN", "TIBC", "IRON", "RETICCTPCT" in the last 72 hours. Sepsis Labs: Recent Labs  Lab 07/08/23 1553  LATICACIDVEN 2.3*   No results found for this or any previous visit (from the past 240 hours).   Radiology Studies: No results found.  Scheduled Meds:  mouth rinse  15 mL Mouth  Rinse 4 times per day   Continuous Infusions:   LOS: 6 days   Aura Leeds, DO [facility] Available via Epic secure chat 7am-7pm After these hours, please refer to coverage provider listed on [email or web address] 07/14/2023, 11:23 AM

## 2023-07-14 NOTE — Progress Notes (Signed)
 Palliative Medicine Progress Note   Patient Name: [name]       Date: 07/14/2023 DOB: [birth year]  Age: 89 y.o. MRN#: [id number] Attending Physician: Eveline Hipps, DO Primary Care Physician: Tisovec, Richard W, MD Admit Date: 07/08/2023    HPI/Patient Profile: 89 y.o. male  with past medical history of CAD/PCI, A-fib (not on AC due to high fall risk and recent UGIB), chronic thrombocytopenia, chronic anemia, CKD stage IIIb, moderate PCM, HTN, and dyslipidemia. Recently hospitalized at [facility] 05/31/2023 through 06/08/2023 with new onset A-fib, influenza A, upper GI bleed with EGD showing duodenal ulcers.  He presented to the ED on 07/08/2023 with shortness of breath and lethargy.  He was found to be bradycardic and hypotensive, given atropine  1 mg IV x 1.  Started on epinephrine  and Levophed  drips in the ED.   Palliative Medicine has been consulted for goals of care. After discussion with family, patient was transitioned to comfort care on 5/8.   Subjective: Chart reviewed. Updates received from RN.  Patient assessed at bedside. He does not respond to voice or light touch.  He appears tachypneic and mildly uncomfortable.  I spoke with son/Andy by phone.  Updates provided on patient's condition as per above.  Recommended starting scheduled pain medication; and he agrees.  Reviewed natural trajectory at end-of-life.  Emotional support provided.  Objective:  Physical Exam Vitals reviewed.  Constitutional:      General: He is not in acute distress.    Appearance: He is ill-appearing.  Pulmonary:     Effort: Tachypnea present.  Neurological:     Comments: Minimally responsive              Palliative Medicine Assessment & Plan   Assessment: Principal Problem:   Shock (HCC)     Recommendations/Plan: Continue comfort care Start scheduled morphine  concentrate solution 5 mg every 6 hours PRN medications are available as per below for symptom management at end-of-life PMT will continue to support   Symptom Management:  Morphine  SL prn for pain or dyspnea Dilaudid  IV as needed for pain or dyspnea Lorazepam  (ATIVAN ) SL/IV prn for anxiety Glycopyrrolate  (ROBINUL ) for excessive secretions Ondansetron  (ZOFRAN ) prn for nausea Polyvinyl alcohol  (LIQUIFILM TEARS) prn for dry eyes Antiseptic oral rinse (BIOTENE) prn for dry mouth     Code Status: DNR - comfort  Prognosis:  Hours - Days  Discharge Planning: Anticipated Hospital Death    Thank you for allowing the Palliative Medicine Team to assist in the care of this patient.   MDM - High   Wynetta Heckle, NP   Please contact Palliative Medicine Team phone at [phone] for questions and concerns.  For individual providers, please see AMION.

## 2023-07-14 NOTE — Hospital Course (Addendum)
 [name] is a 89 y.o. male with past medical history significant for HTN, CAD s/p PCI, dyslipidemia, paroxysmal atrial fibrillation (not on anticoagulation 2/2 high fall risk and recent UGIB), CKD3a, BPH, chronic anemia/thrombocytopenia, recent UGIB 2/2 duodenal ulcer, recent hospitalization for influenza A infection, sinus pause in which beta-blocker was discontinued, moderate protein calorie malnutrition, previous history of tobacco abuse who presented to [facility] ED from SNF for shortness of breath and lethargy.  On arrival patient was noted to be bradycardic, hypotensive and was given atropine  1 mg IV x 1, epinephrine  drip and Levophed  drip was started.  Cardiology was consulted.  PCCM was consulted for admission.   Significant Hospital events: 5/8: Admit ICU, cardiology and palliative care consulted 5/9: Son and daughter at bedside requesting transition to comfort care 5/10: Remains unresponsive, no acute distress 5/13: Opens eyes slightly, moving left hand, otherwise minimally responsive 5/14: Continues to remain minimally responsive. Does open eyes and withdraw to pain 5/17: More somnolent and more lethargic  Subsequently as expected the patient decompensated further and passed away after becoming more more lethargic and hypoxic on full comfort measures early morning on 07/21/23; his time of death was 0523 and 2 nurses verified..  Assessment and Plan:   Cardiogenic shock with Lactic Acidosis Severe bradycardia/second-degree type I AVB Acute metabolic encephalopathy Acute renal failure on CKD stage IIIa Patient presenting from SNF with shortness of breath, lethargy.  Patient was noted to be in cardiogenic shock with severe bradycardia; EKG notable with HR 33 with secondary type I AVB.  Patient was seen by Cardiology, not candidate for pacing/device implantation.  Patient initially received IV atropine  and started on epinephrine /Levophed  drip.  Initially admitted to the intensive care unit  under the PCCM service.  Palliative care was consulted and family desired to transition to comfort measures. Currently NPO except Meds and Sips All medications not in the patient's comfort discontinued. Currently has the following Comfort Medications  -IV Hydromorphone  0.5-2 mg IV q2h PRN Moderate/Severe Pain or dyspnea -Lorazepam  1-2 mg PO/IV q4prn for anxiety  -Glycopyrrolate  0.4 mg  q4h PRN for excessive secretions -Ondansetron  4 mg po/IV q6prn for nausea -Polyvinyl alcohol   prn for dry eyes and Biotene 15 mL Topically prn Dry mouth -Also has Morphine  Concentrate 5 mg SL q2hprn Severe Pain - Continued morphine  Concentrate 5 mg SL q6h scheduled    Essential HTN CAD s/p PCI Dyslipidemia Atrial fibrillation not on anticoagulation Recent UGIB secondary to duodenal ulcer Anemia of chronic medical disease/Iron Deficiency/Chronic Thrombocytopenia BPH Moderate protein calorie malnutrition History of distant tobacco use disorder Hypokalemia  Hypoalbuminemia: Patient's Albumin  was 2.5. Will not CTM and Trend as patient is CMO  Overweight: Complicates overall prognosis and care. Estimated body mass index is 27.52 kg/m as calculated from the following:   Height as of this encounter: 5\' 8"  (1.727 m).   Weight as of this encounter: 82.1 kg. Weight Loss and Dietary Counseling given

## 2023-07-15 DIAGNOSIS — R579 Shock, unspecified: Secondary | ICD-10-CM | POA: Diagnosis not present

## 2023-07-15 DIAGNOSIS — Z789 Other specified health status: Secondary | ICD-10-CM | POA: Diagnosis not present

## 2023-07-15 DIAGNOSIS — Z515 Encounter for palliative care: Secondary | ICD-10-CM | POA: Diagnosis not present

## 2023-07-15 DIAGNOSIS — Z66 Do not resuscitate: Secondary | ICD-10-CM | POA: Diagnosis not present

## 2023-07-15 NOTE — Progress Notes (Incomplete)
  Daily Progress Note   Date: 07/15/2023   Patient Name: [name]  DOB: 07-Jan-1931  MRN: [id number]  Age / Sex: 89 y.o., male  Attending Physician: Kenneth Hipps, DO Primary Care Physician: Kenneth Estrin, MD Admit Date: 07/08/2023 Length of Stay: 7 days  Reason for Consultation: {Reason for Consult:23484}  Past Medical History:  Diagnosis Date   Acute GI bleeding 10/30/2011   Anginal pain (HCC) 12/2010   Arthritis    "knees and left shoulder is completely shot"   BPH (benign prostatic hyperplasia)    CAD (coronary artery disease)    RCA occluded (Cath [place] 1980s);  LHC 12/22/10: dLM 30%, prox to mid LAD 30%, oOM 95%, AV branches supplied collats to PDA, RCA occluded, EF 65%.  He underwent placement of a Promus DES to the OM.   Carotid stenosis    0 - 39% bilateral   Diverticulosis    Exertional dyspnea 10/30/2011   GERD (gastroesophageal reflux disease)    Glaucoma    Gout    Hyperlipidemia    Hypertension    Pneumonia 1950's    Subjective:   Subjective: Chart Reviewed. Updates received. Patient Assessed. Created space and opportunity for patient  and family to explore thoughts and feelings regarding current medical situation.  Today's Discussion: ***  Review of Systems  Objective:   Primary Diagnoses: Present on Admission:  Shock (HCC)   Vital Signs:  BP (!) 143/68 (BP Location: [name] Arm)   Pulse 72   Temp (!) 97.5 F (36.4 C) (Axillary)   Resp 18   Ht 5\' 8"  (1.727 m)   Wt 78.9 kg   SpO2 98%   BMI 26.46 kg/m   Physical Exam  Palliative Assessment/Data: ***   Existing Vynca/ACP Documentation: ***  Advanced Care Planning:   Existing Vynca/ACP Documentation: ***  Primary Decision Maker: {Primary Decision WUJWJ:19147}  Pertinent diagnosis: ***  The patient and/or family consented to a voluntary Advance Care Planning Conversation in person/over the phone***. Individuals present for the conversation:  Summary of the conversation:  ***  Outcome of the conversations and/or documents completed: ***  I spent *** minutes providing separately identifiable ACP services with the patient and/or surrogate decision maker in a voluntary, in-person conversation discussing the patient's wishes and goals as detailed in the above note.  Assessment & Plan:   HPI/Patient Profile:  ***  SUMMARY OF RECOMMENDATIONS   ***  Symptom Management:  ***  Code Status: {Palliative Code status:23503}  Prognosis: {Palliative Care Prognosis:23504}  Discharge Planning: {Palliative dispostion:23505}  Discussed with: ***  Thank you for allowing us  to participate in the care of [name] PMT will continue to support holistically.  Time Total: ***  Detailed review of medical records (labs, imaging, vital signs), medically appropriate exam, discussed with treatment team, counseling and education to patient, family, & staff, documenting clinical information, medication management, coordination of care  Kenneth Right, NP Palliative Medicine Team  Team Phone # [phone] (Nights/Weekends)  10/29/2020, 8:17 AM

## 2023-07-15 NOTE — Plan of Care (Signed)
  Problem: Role Relationship: Goal: Family's ability to cope with current situation will improve Outcome: Progressing   Problem: Coping: Goal: Level of anxiety will decrease Outcome: Progressing   Problem: Safety: Goal: Ability to remain free from injury will improve Outcome: Progressing

## 2023-07-15 NOTE — Progress Notes (Signed)
 Purple Palliative Comfort Care bag given to pt/family and soft pillow placed under pt's head.

## 2023-07-15 NOTE — Progress Notes (Signed)
 PROGRESS NOTE    [name]  ZOX:[id number] DOB: 05-Feb-1931 DOA: 07/08/2023 PCP: Tisovec, Richard W, MD   Brief Narrative:  [name] is a 89 y.o. male with past medical history significant for HTN, CAD s/p PCI, dyslipidemia, paroxysmal atrial fibrillation (not on anticoagulation 2/2 high fall risk and recent UGIB), CKD3a, BPH, chronic anemia/thrombocytopenia, recent UGIB 2/2 duodenal ulcer, recent hospitalization for influenza A infection, sinus pause in which beta-blocker was discontinued, moderate protein calorie malnutrition, previous history of tobacco abuse who [facility] ED from SNF for shortness of breath and lethargy.  On arrival patient was noted to be bradycardic, hypotensive and was given atropine  1 mg IV x 1, epinephrine  drip and Levophed  drip was started.  Cardiology was consulted.  PCCM was consulted for admission.   Significant Hospital events: 5/8: Admit ICU, cardiology and palliative care consulted 5/9: Son and daughter at bedside requesting transition to comfort care 5/10: Remains unresponsive, no acute distress 5/13: Opens eyes slightly, moving left hand, otherwise minimally responsive 5/14: Continues to remain minimally responsive. Does open eyes and withdraw to pain   Assessment and Plan:   Cardiogenic shock with Lactic Acidosis Severe bradycardia/second-degree type I AVB Acute metabolic encephalopathy Acute renal failure on CKD stage IIIa Patient presenting from SNF with shortness of breath, lethargy.  Patient was noted to be in cardiogenic shock with severe bradycardia; EKG notable with HR 33 with secondary type I AVB.  Patient was seen by Cardiology, not candidate for pacing/device implantation.  Patient initially received IV atropine  and started on epinephrine /Levophed  drip.  Initially admitted to the intensive care unit under the PCCM service.  Palliative care was consulted and family desired to transition to comfort measures. Currently NPO except Meds and Sips  All medications not in the patient's comfort discontinued. Currently has the following Comfort Medications  -IV Hydromorphone  0.5-2 mg IV q2h PRN Moderate/Severe Pain or dyspnea -Lorazepam  1-2 mg PO/IV q4prn for anxiety -Glycopyrrolate  0.4 mg [place] q4h PRN for excessive secretions -Ondansetron  4 mg po/IV q6prn for nausea -Polyvinyl alcohol   prn for dry eyes and Biotene 15 mL Topically prn Dry mouth -Also has Morphine  Concentrate 5 mg SL q2hprn Severe Pain -Currently now being started on Morphine  Concentrate 5 mg SL q6h scheduled    Essential HTN CAD s/p PCI Dyslipidemia Atrial fibrillation not on anticoagulation Recent UGIB secondary to duodenal ulcer Anemia of chronic medical disease/Iron Deficiency/Chronic Thrombocytopenia BPH Moderate protein calorie malnutrition History of distant tobacco use disorder Hypokalemia  Hypoalbuminemia: Patient's Albumin  was 2.5. Will not CTM and Trend as patient is CMO  Overweight: Complicates overall prognosis and care. Estimated body mass index is 27.52 kg/m as calculated from the following:   Height as of this encounter: 5\' 8"  (1.727 m).   Weight as of this encounter: 82.1 kg. Weight Loss and Dietary Counseling given   DVT prophylaxis: SCDs Start: 07/08/23 1441    Code Status: Do not attempt resuscitation (DNR) - Comfort care Family Communication:   Disposition Plan:  Level of care: Palliative Care Status is: Inpatient Remains inpatient appropriate because: Has a high risk for further decompensation and anticipated Hospital Death    Consultants:  Cardiology PCCM Palliative care  Procedures:  As delineated as above  Antimicrobials:  Anti-infectives (From admission, onward)    None       Subjective: Seen and examined at bedside and he is not as responsive today and resting comfortably.  Appears to be in no acute distress.  Does not really participate in encounter.  Objective: Vitals:   07/14/23 0500 07/14/23 1101 07/15/23 0421  07/15/23 0500  BP:  (!) 149/59 (!) 143/68   Pulse:  85 72   Resp:  18    Temp:  (!) 97.3 F (36.3 C) (!) 97.5 F (36.4 C)   TempSrc:  Oral Axillary   SpO2:  97% 98%   Weight: 82.1 kg   78.9 kg  Height:        Intake/Output Summary (Last 24 hours) at 07/15/2023 0830 Last data filed at 07/15/2023 0424 Gross per 24 hour  Intake --  Output 800 ml  Net -800 ml   Filed Weights   07/13/23 0741 07/14/23 0500 07/15/23 0500  Weight: 83 kg 82.1 kg 78.9 kg   Examination: Physical Exam:  Constitutional: Elderly overweight chronically ill-appearing Caucasian male appears calm and comfortable at this time Respiratory: Diminished to auscultation bilaterally, no wheezing, rales, rhonchi or crackles. Normal respiratory effort and patient is not tachypenic. No accessory muscle use.  Unlabored breathing Cardiovascular: He is a little bradycardic and has somewhat distant heart sounds.  No appreciable peripheral extremity edema Abdomen: Soft, non-tender, mildly distended secondary body habitus. Bowel sounds positive.  GU: Deferred. Neurologic: Does not follow commands and does not arouse to physical verbal stimuli today Psychiatric: Somnolent and Drowsy and appears calm  Data Reviewed: I have personally reviewed following labs and imaging studies  CBC: Recent Labs  Lab 07/08/23 1222 07/08/23 1530  WBC 7.1  --   NEUTROABS 5.2  --   HGB 7.3* 7.1*  HCT 23.3* 21.0*  MCV 98.3  --   PLT 134*  --    Basic Metabolic Panel: Recent Labs  Lab 07/08/23 1222 07/08/23 1530 07/08/23 1553  NA 143 143  --   K 3.9 3.0*  --   CL 111  --   --   CO2 20*  --   --   GLUCOSE 94  --   --   BUN 69*  --   --   CREATININE 2.81*  --   --   CALCIUM  8.8*  --   --   MG  --   --  1.8   GFR: Estimated Creatinine Clearance: 16.6 mL/min (A) (by C-G formula based on SCr of 2.81 mg/dL (H)). Liver Function Tests: Recent Labs  Lab 07/08/23 1222  AST 45*  ALT 25  ALKPHOS 76  BILITOT 0.6  PROT 5.5*   ALBUMIN  2.5*   No results for input(s): "LIPASE", "AMYLASE" in the last 168 hours. No results for input(s): "AMMONIA" in the last 168 hours. Coagulation Profile: No results for input(s): "INR", "PROTIME" in the last 168 hours. Cardiac Enzymes: No results for input(s): "CKTOTAL", "CKMB", "CKMBINDEX", "TROPONINI" in the last 168 hours. BNP (last 3 results) No results for input(s): "PROBNP" in the last 8760 hours. HbA1C: No results for input(s): "HGBA1C" in the last 72 hours. CBG: Recent Labs  Lab 07/08/23 1944  GLUCAP 143*   Lipid Profile: No results for input(s): "CHOL", "HDL", "LDLCALC", "TRIG", "CHOLHDL", "LDLDIRECT" in the last 72 hours. Thyroid Function Tests: No results for input(s): "TSH", "T4TOTAL", "FREET4", "T3FREE", "THYROIDAB" in the last 72 hours. Anemia Panel: No results for input(s): "VITAMINB12", "FOLATE", "FERRITIN", "TIBC", "IRON", "RETICCTPCT" in the last 72 hours. Sepsis Labs: Recent Labs  Lab 07/08/23 1553  LATICACIDVEN 2.3*   No results found for this or any previous visit (from the past 240 hours).   Radiology Studies: No results found.  Scheduled Meds:  morphine  CONCENTRATE  5 mg Sublingual  Q6H   mouth rinse  15 mL Mouth Rinse 4 times per day   Continuous Infusions:   LOS: 7 days   Aura Leeds, DO [facility] Available via Epic secure chat 7am-7pm After these hours, please refer to coverage provider listed on [email or web address] 07/15/2023, 8:30 AM

## 2023-07-16 DIAGNOSIS — Z515 Encounter for palliative care: Secondary | ICD-10-CM | POA: Diagnosis not present

## 2023-07-16 DIAGNOSIS — Z7189 Other specified counseling: Secondary | ICD-10-CM | POA: Diagnosis not present

## 2023-07-16 DIAGNOSIS — Z66 Do not resuscitate: Secondary | ICD-10-CM | POA: Diagnosis not present

## 2023-07-16 DIAGNOSIS — R579 Shock, unspecified: Secondary | ICD-10-CM | POA: Diagnosis not present

## 2023-07-16 DIAGNOSIS — Z789 Other specified health status: Secondary | ICD-10-CM

## 2023-07-16 NOTE — Progress Notes (Signed)
 Daily Progress Note   Date: 07/16/2023   Patient Name: [name]  DOB: 11/03/1930  MRN: [id number]  Age / Sex: 89 y.o., male  Attending Physician: Kenneth Hipps, DO Primary Care Physician: Kenneth Estrin, MD Admit Date: 07/08/2023 Length of Stay: 8 days  Reason for Consultation: Non pain symptom management, Pain control, and Terminal Care  Past Medical History:  Diagnosis Date   Acute GI bleeding 10/30/2011   Anginal pain (HCC) 12/2010   Arthritis    "knees and left shoulder is completely shot"   BPH (benign prostatic hyperplasia)    CAD (coronary artery disease)    RCA occluded (Cath [place] 1980s);  LHC 12/22/10: dLM 30%, prox to mid LAD 30%, oOM 95%, AV branches supplied collats to PDA, RCA occluded, EF 65%.  He underwent placement of a Promus DES to the OM.   Carotid stenosis    0 - 39% bilateral   Diverticulosis    Exertional dyspnea 10/30/2011   GERD (gastroesophageal reflux disease)    Glaucoma    Gout    Hyperlipidemia    Hypertension    Pneumonia 1950's    Subjective:   Subjective: Chart Reviewed. Updates received. Patient Assessed. Created space and opportunity for patient  and family to explore thoughts and feelings regarding current medical situation.  Today's Discussion: Today before entering the room I reviewed the patient's chart for updates.  I reviewed hospitalist notes from today.  Reviewed vital signs are stable as he is afebrile, stable blood pressure, satting 95% on room air with documented respiratory rate of 19 this morning.  I reviewed medications and he is receiving scheduled oral morphine  5 mg every 6 hours as ordered.  No need for as needed breakthrough symptom management medications in the past 24 hours.  When I entered the room, no family was present.  The patient was sleeping soundly and elected to not wake him.  His respirations are even and unlabored, respiratory rate counted at 16 this morning.  No accessory muscle use or other signs of  respiratory distress.  No moaning, no obvious excessive secretions.  Patient appears comfortable and peaceful.  I spoke with bedside nurse and discussed continued availability of IV as needed medications for pain, dyspnea, anxiety.  The patient has advancing heart failure and was admitted for shock.  Nursing notes he is now 4+ pitting edema showing fluid overload and accumulation and anticipate potential for significant symptoms including dyspnea and anxiety/agitation at any point.  Reviewed use of parenteral medications as needed when symptoms become apparent.  Otherwise, continue as needed morphine  at his controlled his tachypnea noted a couple days ago.  I shared that palliative medicine remains available for any symptom management needs.  Review of Systems  Unable to perform ROS   Objective:   Primary Diagnoses: Present on Admission:  Shock (HCC)   Vital Signs:  BP 139/66 (BP Location: Right Arm)   Pulse 81   Temp 98 F (36.7 C) (Axillary)   Resp 19   Ht 5\' 8"  (1.727 m)   Wt 78.9 kg   SpO2 95%   BMI 26.46 kg/m   Physical Exam Vitals and nursing note reviewed.  Constitutional:      General: He is sleeping. He is not in acute distress.    Appearance: He is ill-appearing.  HENT:     Head: Normocephalic and atraumatic.  Pulmonary:     Effort: Pulmonary effort is normal. No respiratory distress.     Comments: Respirations even  and unlabored, respiratory rate approximately 16 breaths/min Abdominal:     General: Abdomen is flat.  Musculoskeletal:     Right lower leg: 4+ Edema present.     Left lower leg: 4+ Edema present.     Palliative Assessment/Data: 10%   Existing Vynca/ACP Documentation: Advance directive signed 01/15/2022  Assessment & Plan:   HPI/Patient Profile:  89 y.o. male  with past medical history of CAD/PCI, A-fib (not on AC due to high fall risk and recent UGIB), chronic thrombocytopenia, chronic anemia, CKD stage IIIb, moderate PCM, HTN, and  dyslipidemia. Recently hospitalized at [facility] 05/31/2023 through 06/08/2023 with new onset A-fib, influenza A, upper GI bleed with EGD showing duodenal ulcers.  He presented to the ED on 07/08/2023 with shortness of breath and lethargy.  He was found to be bradycardic and hypotensive, given atropine  1 mg IV x 1.  Started on epinephrine  and Levophed  drips in the ED.   Palliative Medicine has been consulted for goals of care. After discussion with family, patient was transitioned to comfort care on 5/8.  SUMMARY OF RECOMMENDATIONS   DNR-comfort Continue comfort care Continue symptom management orders below, Discussed with nursing availability of as needed parenteral medications for breakthrough symptoms which are anticipated Palliative medicine will continue to follow  Symptom Management:  Biotene 15 mL topical as needed dry mouth Robinul  0.4 mg subcutaneous every 4 hours as needed excess secretions Dilaudid  0.5 to 2 mg IV every 2 hours as needed severe pain (7-10), moderate pain (4-6), or dyspnea/respiratory distress Ativan  1 to 2 mg p.o. every 4 hours as needed anxiety Ativan  1 to 2 mg IV every 4 hours as needed anxiety, sedation Morphine  concentrate 5 mg sublingual every 2 hours as needed severe pain, dyspnea Morphine  concentrate 5 mg sublingual every 6 hours scheduled Zofran  4 mg IV every 6 hours as needed nausea Polyvinyl alcohol  1.4% ophthalmic 1 drop OU 4 times daily as needed tries  Code Status: DNR-comfort  Prognosis: Hours - Days  Discharge Planning: Anticipated Hospital Death  Discussed with: Patient's family, medical team, bedside nursing  Thank you for allowing us  to participate in the care of [name] PMT will continue to support holistically.  Billing based on MDM: High  Problems Addressed: One acute or chronic illness or injury that poses a threat to life or bodily function  Risks: Parenteral controlled substances (decision to continue as needed availability for  anticipated high symptom burden at end-of-life)   Detailed review of medical records (labs, imaging, vital signs), medically appropriate exam, discussed with treatment team, counseling and education to patient, family, & staff, documenting clinical information, medication management, coordination of care  Lizbeth Right, NP Palliative Medicine Team  Team Phone # [phone] (Nights/Weekends)  10/29/2020, 8:17 AM

## 2023-07-16 NOTE — Plan of Care (Signed)
  Problem: Clinical Measurements: Goal: Quality of life will improve Outcome: Not Progressing   Problem: Pain Management: Goal: Satisfaction with pain management regimen will improve Outcome: Progressing   Problem: Clinical Measurements: Goal: Ability to maintain clinical measurements within normal limits will improve Outcome: Not Progressing   Problem: Nutrition: Goal: Adequate nutrition will be maintained Outcome: Not Progressing   Problem: Coping: Goal: Level of anxiety will decrease Outcome: Progressing

## 2023-07-16 NOTE — Progress Notes (Signed)
 PROGRESS NOTE    [name]  GEX:[id number] DOB: 16-Dec-1930 DOA: 07/08/2023 PCP: Tisovec, Richard W, MD   Brief Narrative:  [name] is a 89 y.o. male with past medical history significant for HTN, CAD s/p PCI, dyslipidemia, paroxysmal atrial fibrillation (not on anticoagulation 2/2 high fall risk and recent UGIB), CKD3a, BPH, chronic anemia/thrombocytopenia, recent UGIB 2/2 duodenal ulcer, recent hospitalization for influenza A infection, sinus pause in which beta-blocker was discontinued, moderate protein calorie malnutrition, previous history of tobacco abuse who presented to [facility] ED from SNF for shortness of breath and lethargy.  On arrival patient was noted to be bradycardic, hypotensive and was given atropine  1 mg IV x 1, epinephrine  drip and Levophed  drip was started.  Cardiology was consulted.  PCCM was consulted for admission.   Significant Hospital events: 5/8: Admit ICU, cardiology and palliative care consulted 5/9: Son and daughter at bedside requesting transition to comfort care 5/10: Remains unresponsive, no acute distress 5/13: Opens eyes slightly, moving left hand, otherwise minimally responsive 5/14: Continues to remain minimally responsive. Does open eyes and withdraw to pain   Assessment and Plan:   Cardiogenic shock with Lactic Acidosis Severe bradycardia/second-degree type I AVB Acute metabolic encephalopathy Acute renal failure on CKD stage IIIa Patient presenting from SNF with shortness of breath, lethargy.  Patient was noted to be in cardiogenic shock with severe bradycardia; EKG notable with HR 33 with secondary type I AVB.  Patient was seen by Cardiology, not candidate for pacing/device implantation.  Patient initially received IV atropine  and started on epinephrine /Levophed  drip.  Initially admitted to the intensive care unit under the PCCM service.  Palliative care was consulted and family desired to transition to comfort measures. Currently NPO except Meds and Sips  All medications not in the patient's comfort discontinued. Currently has the following Comfort Medications  -IV Hydromorphone  0.5-2 mg IV q2h PRN Moderate/Severe Pain or dyspnea -Lorazepam  1-2 mg PO/IV q4prn for anxiety  -Glycopyrrolate  0.4 mg [place] q4h PRN for excessive secretions -Ondansetron  4 mg po/IV q6prn for nausea -Polyvinyl alcohol   prn for dry eyes and Biotene 15 mL Topically prn Dry mouth -Also has Morphine  Concentrate 5 mg SL q2hprn Severe Pain -Currently now being started on Morphine  Concentrate 5 mg SL q6h scheduled    Essential HTN CAD s/p PCI Dyslipidemia Atrial fibrillation not on anticoagulation Recent UGIB secondary to duodenal ulcer Anemia of chronic medical disease/Iron Deficiency/Chronic Thrombocytopenia BPH Moderate protein calorie malnutrition History of distant tobacco use disorder Hypokalemia  Hypoalbuminemia: Patient's Albumin  was 2.5. Will not CTM and Trend as patient is CMO  Overweight: Complicates overall prognosis and care. Estimated body mass index is 27.52 kg/m as calculated from the following:   Height as of this encounter: 5\' 8"  (1.727 m).   Weight as of this encounter: 82.1 kg. Weight Loss and Dietary Counseling given   DVT prophylaxis: SCDs Start: 07/08/23 1441    Code Status: Do not attempt resuscitation (DNR) - Comfort care Family Communication:   Disposition Plan:  Level of care: Palliative Care Status is: Inpatient Remains inpatient appropriate because: Has a high risk for further decompensation and anticipated Hospital Death     Consultants:  Cardiology PCCM Palliative care  Procedures:  As delineated as above   Antimicrobials:  Anti-infectives (From admission, onward)    None       Subjective: Seen and examined at bedside and he minimally is responsive and opens his eyes to his name but does not interact.  No acute issues noted overnight.  No family at bedside.  Anticipating in-hospital death and prognosis is hours to  days.  Objective: Vitals:   07/14/23 1101 07/15/23 0421 07/15/23 0500 07/16/23 0427  BP: (!) 149/59 (!) 143/68  139/66  Pulse: 85 72  81  Resp: 18   19  Temp: (!) 97.3 F (36.3 C) (!) 97.5 F (36.4 C)  98 F (36.7 C)  TempSrc: Oral Axillary  Axillary  SpO2: 97% 98%  95%  Weight:   78.9 kg   Height:        Intake/Output Summary (Last 24 hours) at 07/16/2023 1307 Last data filed at 07/16/2023 0428 Gross per 24 hour  Intake 0 ml  Output 475 ml  Net -475 ml   Filed Weights   07/13/23 0741 07/14/23 0500 07/15/23 0500  Weight: 83 kg 82.1 kg 78.9 kg   Examination: Physical Exam:  Constitutional: Elderly overweight chronically ill-appearing Caucasian male who appears calm and comfortable at this time and opens his eyes to his name Respiratory: Diminished to auscultation bilaterally, no wheezing, rales, rhonchi or crackles. Normal respiratory effort and patient is not tachypenic. No accessory muscle use.  Unlabored breathing Cardiovascular: Is a little bit bradycardic and has distant heart sounds.  Has some lower extremity edema noted Abdomen: Soft, non-tender, slightly distended secondary to body habitus. Bowel sounds positive.  GU: Deferred. Neurologic: Opens his eyes to his name but does not follow commands Psychiatric: Somnolent and drowsy appearing  Data Reviewed: I have personally reviewed following labs and imaging studies  CBC: No results for input(s): "WBC", "NEUTROABS", "HGB", "HCT", "MCV", "PLT" in the last 168 hours. Basic Metabolic Panel: No results for input(s): "NA", "K", "CL", "CO2", "GLUCOSE", "BUN", "CREATININE", "CALCIUM ", "MG", "PHOS" in the last 168 hours. GFR: Estimated Creatinine Clearance: 16.6 mL/min (A) (by C-G formula based on SCr of 2.81 mg/dL (H)). Liver Function Tests: No results for input(s): "AST", "ALT", "ALKPHOS", "BILITOT", "PROT", "ALBUMIN " in the last 168 hours. No results for input(s): "LIPASE", "AMYLASE" in the last 168 hours. No results  for input(s): "AMMONIA" in the last 168 hours. Coagulation Profile: No results for input(s): "INR", "PROTIME" in the last 168 hours. Cardiac Enzymes: No results for input(s): "CKTOTAL", "CKMB", "CKMBINDEX", "TROPONINI" in the last 168 hours. BNP (last 3 results) No results for input(s): "PROBNP" in the last 8760 hours. HbA1C: No results for input(s): "HGBA1C" in the last 72 hours. CBG: No results for input(s): "GLUCAP" in the last 168 hours. Lipid Profile: No results for input(s): "CHOL", "HDL", "LDLCALC", "TRIG", "CHOLHDL", "LDLDIRECT" in the last 72 hours. Thyroid Function Tests: No results for input(s): "TSH", "T4TOTAL", "FREET4", "T3FREE", "THYROIDAB" in the last 72 hours. Anemia Panel: No results for input(s): "VITAMINB12", "FOLATE", "FERRITIN", "TIBC", "IRON", "RETICCTPCT" in the last 72 hours. Sepsis Labs: No results for input(s): "PROCALCITON", "LATICACIDVEN" in the last 168 hours.  No results found for this or any previous visit (from the past 240 hours).   Radiology Studies: No results found. Scheduled Meds:  morphine  CONCENTRATE  5 mg Sublingual Q6H   mouth rinse  15 mL Mouth Rinse 4 times per day   Continuous Infusions:   LOS: 8 days   Aura Leeds, DO [facility] Available via Epic secure chat 7am-7pm After these hours, please refer to coverage provider listed on [email or web address] 07/16/2023, 1:07 PM

## 2023-07-16 NOTE — Plan of Care (Signed)
  Problem: Coping: Goal: Ability to identify and develop effective coping behavior will improve Outcome: Progressing   Problem: Pain Management: Goal: Satisfaction with pain management regimen will improve Outcome: Progressing   Problem: Clinical Measurements: Goal: Ability to maintain clinical measurements within normal limits will improve Outcome: Progressing   Problem: Activity: Goal: Risk for activity intolerance will decrease Outcome: Progressing   Problem: Skin Integrity: Goal: Risk for impaired skin integrity will decrease Outcome: Progressing   Problem: Safety: Goal: Ability to remain free from injury will improve Outcome: Progressing

## 2023-07-17 DIAGNOSIS — Z66 Do not resuscitate: Secondary | ICD-10-CM | POA: Diagnosis not present

## 2023-07-17 DIAGNOSIS — Z515 Encounter for palliative care: Secondary | ICD-10-CM | POA: Diagnosis not present

## 2023-07-17 DIAGNOSIS — Z7189 Other specified counseling: Secondary | ICD-10-CM | POA: Diagnosis not present

## 2023-07-17 DIAGNOSIS — R579 Shock, unspecified: Secondary | ICD-10-CM | POA: Diagnosis not present

## 2023-07-17 MED ORDER — GLYCOPYRROLATE 0.2 MG/ML IJ SOLN
0.3000 mg | INTRAMUSCULAR | Status: DC | PRN
  Administered 2023-07-17 (×2): 0.3 mg via INTRAVENOUS
  Filled 2023-07-17 (×2): qty 2

## 2023-07-17 NOTE — Progress Notes (Signed)
 Daily Progress Note   Date: 07/17/2023   Patient Name: [name]  DOB: 09/10/1930  MRN: [id number]  Age / Sex: 89 y.o., male  Attending Physician: Eveline Hipps, DO Primary Care Physician: Suzzanne Estrin, MD Admit Date: 07/08/2023 Length of Stay: 9 days  Reason for Consultation: Non pain symptom management, Pain control, and Terminal Care  Past Medical History:  Diagnosis Date   Acute GI bleeding 10/30/2011   Anginal pain (HCC) 12/2010   Arthritis    "knees and left shoulder is completely shot"   BPH (benign prostatic hyperplasia)    CAD (coronary artery disease)    RCA occluded (Cath [place] 1980s);  LHC 12/22/10: dLM 30%, prox to mid LAD 30%, oOM 95%, AV branches supplied collats to PDA, RCA occluded, EF 65%.  He underwent placement of a Promus DES to the OM.   Carotid stenosis    0 - 39% bilateral   Diverticulosis    Exertional dyspnea 10/30/2011   GERD (gastroesophageal reflux disease)    Glaucoma    Gout    Hyperlipidemia    Hypertension    Pneumonia 1950's    Subjective:   Subjective: Chart Reviewed. Updates received. Patient Assessed. Created space and opportunity for patient  and family to explore thoughts and feelings regarding current medical situation.  Today's Discussion: Today before entering the room I reviewed the patient's chart for updates.  I reviewed hospitalist notes from today.  Reviewed vital signs, has low-grade fever at 99 F, heart rate 98, respirations 15.  He is a bit more hypotensive today at 95/54 and his saturations are significantly lower in the low to mid 80s.  I spoke with bedside nurse before entering the room and she states he has been satting in the low to mid 80s for most of the day.  I reviewed medications and he is receiving scheduled oral morphine  5 mg every 6 hours as ordered.  He has also required 2 doses of Robinul  for excessive secretions, also was noted by the bedside nurse, in the past 24 hours.  He is also required a dose of  IV Dilaudid  this morning.  When I entered the room, no family was present.  The patient was sleeping soundly but appeared uncomfortable to the bedside nurse adjusted his position at which point he went when he moved.  Nursing discussed concerned about Robinul  being ordered subcu which I changed promptly to IV formulation to prevent discomfort.  Overall the patient appears to be declining and beginning the active dying process.  Nursing is doing well to manage the symptoms and we will continue to support that with appropriate medications.  I encouraged him to call us  for any symptom management needs.  Review of Systems  Unable to perform ROS   Objective:   Primary Diagnoses: Present on Admission:  Shock (HCC)   Vital Signs:  BP (!) 95/54 (BP Location: Right Arm)   Pulse 98   Temp 99 F (37.2 C) (Oral)   Resp 15   Ht 5\' 8"  (1.727 m)   Wt 78.9 kg   SpO2 (!) 82%   BMI 26.46 kg/m   Physical Exam Vitals and nursing note reviewed.  Constitutional:      General: He is sleeping. He is not in acute distress.    Appearance: He is ill-appearing.  HENT:     Head: Normocephalic and atraumatic.  Pulmonary:     Effort: Pulmonary effort is normal. No respiratory distress.     Comments: Respirations  even and unlabored, respiratory rate approximately 14 breaths/min, saturations much lower today Abdominal:     General: Abdomen is flat.  Musculoskeletal:     Right lower leg: 4+ Edema present.     Left lower leg: 4+ Edema present.     Palliative Assessment/Data: 10%   Existing Vynca/ACP Documentation: Advance directive signed 01/15/2022  Assessment & Plan:   HPI/Patient Profile:  89 y.o. male  with past medical history of CAD/PCI, A-fib (not on AC due to high fall risk and recent UGIB), chronic thrombocytopenia, chronic anemia, CKD stage IIIb, moderate PCM, HTN, and dyslipidemia. Recently hospitalized at [facility] 05/31/2023 through 06/08/2023 with new onset A-fib, influenza A, upper GI bleed  with EGD showing duodenal ulcers.  He presented to the ED on 07/08/2023 with shortness of breath and lethargy.  He was found to be bradycardic and hypotensive, given atropine  1 mg IV x 1.  Started on epinephrine  and Levophed  drips in the ED.   Palliative Medicine has been consulted for goals of care. After discussion with family, patient was transitioned to comfort care on 5/8.  SUMMARY OF RECOMMENDATIONS   DNR-comfort Continue comfort care Continue symptom management orders below Discussed with nursing availability of as needed parenteral medications for breakthrough symptoms which are anticipated Palliative medicine will continue to follow  Symptom Management:  Biotene 15 mL topical as needed dry mouth CHANGED Robinul  to 0.3 mg IV every 4 hours as needed excess secretions Dilaudid  0.5 to 2 mg IV every 2 hours as needed severe pain (7-10), moderate pain (4-6), or dyspnea/respiratory distress Ativan  1 to 2 mg p.o. every 4 hours as needed anxiety Ativan  1 to 2 mg IV every 4 hours as needed anxiety, sedation Morphine  concentrate 5 mg sublingual every 2 hours as needed severe pain, dyspnea Morphine  concentrate 5 mg sublingual every 6 hours scheduled Zofran  4 mg IV every 6 hours as needed nausea Polyvinyl alcohol  1.4% ophthalmic 1 drop OU 4 times daily as needed tries  Code Status: DNR-comfort  Prognosis: Hours - Days  Discharge Planning: Anticipated Hospital Death  Discussed with: Medical team, bedside nursing  Thank you for allowing us  to participate in the care of Barret Esquivel PMT will continue to support holistically.  Billing based on MDM: High  Problems Addressed: One acute or chronic illness or injury that poses a threat to life or bodily function  Risks: Parenteral controlled substances    Detailed review of medical records (labs, imaging, vital signs), medically appropriate exam, discussed with treatment team, counseling and education to patient, family, & staff,  documenting clinical information, medication management, coordination of care  Lizbeth Right, NP Palliative Medicine Team  Team Phone # [phone] (Nights/Weekends)  10/29/2020, 8:17 AM

## 2023-07-17 NOTE — Plan of Care (Signed)

## 2023-07-17 NOTE — Progress Notes (Signed)
 PROGRESS NOTE    [name]  ZOX:[id number] DOB: Nov 11, 1930 DOA: 07/08/2023 PCP: Tisovec, Richard W, MD   Brief Narrative:  [name] is a 89 y.o. male with past medical history significant for HTN, CAD s/p PCI, dyslipidemia, paroxysmal atrial fibrillation (not on anticoagulation 2/2 high fall risk and recent UGIB), CKD3a, BPH, chronic anemia/thrombocytopenia, recent UGIB 2/2 duodenal ulcer, recent hospitalization for influenza A infection, sinus pause in which beta-blocker was discontinued, moderate protein calorie malnutrition, previous history of tobacco abuse who presented to [facility] ED from SNF for shortness of breath and lethargy.  On arrival patient was noted to be bradycardic, hypotensive and was given atropine  1 mg IV x 1, epinephrine  drip and Levophed  drip was started.  Cardiology was consulted.  PCCM was consulted for admission.   Significant Hospital events: 5/8: Admit ICU, cardiology and palliative care consulted 5/9: Son and daughter at bedside requesting transition to comfort care 5/10: Remains unresponsive, no acute distress 5/13: Opens eyes slightly, moving left hand, otherwise minimally responsive 5/14: Continues to remain minimally responsive. Does open eyes and withdraw to pain 5/17: More somnolent and more lethargic   Assessment and Plan:   Cardiogenic shock with Lactic Acidosis Severe bradycardia/second-degree type I AVB Acute metabolic encephalopathy Acute renal failure on CKD stage IIIa Patient presenting from SNF with shortness of breath, lethargy.  Patient was noted to be in cardiogenic shock with severe bradycardia; EKG notable with HR 33 with secondary type I AVB.  Patient was seen by Cardiology, not candidate for pacing/device implantation.  Patient initially received IV atropine  and started on epinephrine /Levophed  drip.  Initially admitted to the intensive care unit under the PCCM service.  Palliative care was consulted and family desired to transition to comfort  measures. Currently NPO except Meds and Sips All medications not in the patient's comfort discontinued. Currently has the following Comfort Medications  -IV Hydromorphone  0.5-2 mg IV q2h PRN Moderate/Severe Pain or dyspnea -Lorazepam  1-2 mg PO/IV q4prn for anxiety  -Glycopyrrolate  0.4 mg [place] q4h PRN for excessive secretions -Ondansetron  4 mg po/IV q6prn for nausea -Polyvinyl alcohol   prn for dry eyes and Biotene 15 mL Topically prn Dry mouth -Also has Morphine  Concentrate 5 mg SL q2hprn Severe Pain -Currently now being started on Morphine  Concentrate 5 mg SL q6h scheduled    Essential HTN CAD s/p PCI Dyslipidemia Atrial fibrillation not on anticoagulation Recent UGIB secondary to duodenal ulcer Anemia of chronic medical disease/Iron Deficiency/Chronic Thrombocytopenia BPH Moderate protein calorie malnutrition History of distant tobacco use disorder Hypokalemia  Hypoalbuminemia: Patient's Albumin  was 2.5. Will not CTM and Trend as patient is CMO  Overweight: Complicates overall prognosis and care. Estimated body mass index is 27.52 kg/m as calculated from the following:   Height as of this encounter: 5\' 8"  (1.727 m).   Weight as of this encounter: 82.1 kg. Weight Loss and Dietary Counseling given   DVT prophylaxis: SCDs Start: 07/08/23 1441    Code Status: Do not attempt resuscitation (DNR) - Comfort care Family Communication: No family present @ bedside  Disposition Plan:  Level of care: Palliative Care Status is: Inpatient Remains inpatient appropriate because: Has a high risk for further decompensation and anticipated Hospital Death     Consultants:  Cardiology PCCM Palliative Care  Procedures:  As delineated as above   Antimicrobials:  Anti-infectives (From admission, onward)    None       Subjective: Seen and examined at bedside and he continues to be minimally responsive.  Did not really open his  eyes today to physical verbal stimuli.  Appears more lethargic.   No acute issues noted overnight no family at bedside.  Continuing to anticipate in-hospital death given that his prognosis is hours to days.  Objective: Vitals:   07/15/23 0500 07/16/23 0427 07/17/23 0359 07/17/23 0500  BP:  139/66 (!) 95/54   Pulse:  81 98   Resp:  19 15   Temp:  98 F (36.7 C) 99 F (37.2 C)   TempSrc:  Axillary Oral   SpO2:  95% (!) 82%   Weight: 78.9 kg   78.9 kg  Height:        Intake/Output Summary (Last 24 hours) at 07/17/2023 1258 Last data filed at 07/17/2023 0400 Gross per 24 hour  Intake 0 ml  Output 150 ml  Net -150 ml   Filed Weights   07/14/23 0500 07/15/23 0500 07/17/23 0500  Weight: 82.1 kg 78.9 kg 78.9 kg   Examination: Physical Exam:  Constitutional: Overweight chronically ill-appearing Caucasian male who appears more lethargic does not really respond to physical or verbal stimuli but is calm appearing Respiratory: Diminished to auscultation bilaterally, no wheezing, rales, rhonchi or crackles. Normal respiratory effort and patient is not tachypenic. No accessory muscle use.  Unlabored breathing Cardiovascular: RRR, no murmurs / rubs / gallops. S1 and S2 auscultated.  Has 1-2+ lower extremity edema Abdomen: Soft, non-tender, slightly distended secondary to body habitus. Bowel sounds positive.  GU: Deferred. Neurologic: Does not really arouse to physical or verbal stimuli and does not follow commands Psychiatric: Appears somnolent and drowsy  Data Reviewed: I have personally reviewed following labs and imaging studies  CBC: No results for input(s): "WBC", "NEUTROABS", "HGB", "HCT", "MCV", "PLT" in the last 168 hours. Basic Metabolic Panel: No results for input(s): "NA", "K", "CL", "CO2", "GLUCOSE", "BUN", "CREATININE", "CALCIUM ", "MG", "PHOS" in the last 168 hours. GFR: Estimated Creatinine Clearance: 16.6 mL/min (A) (by C-G formula based on SCr of 2.81 mg/dL (H)). Liver Function Tests: No results for input(s): "AST", "ALT", "ALKPHOS",  "BILITOT", "PROT", "ALBUMIN " in the last 168 hours. No results for input(s): "LIPASE", "AMYLASE" in the last 168 hours. No results for input(s): "AMMONIA" in the last 168 hours. Coagulation Profile: No results for input(s): "INR", "PROTIME" in the last 168 hours. Cardiac Enzymes: No results for input(s): "CKTOTAL", "CKMB", "CKMBINDEX", "TROPONINI" in the last 168 hours. BNP (last 3 results) No results for input(s): "PROBNP" in the last 8760 hours. HbA1C: No results for input(s): "HGBA1C" in the last 72 hours. CBG: No results for input(s): "GLUCAP" in the last 168 hours. Lipid Profile: No results for input(s): "CHOL", "HDL", "LDLCALC", "TRIG", "CHOLHDL", "LDLDIRECT" in the last 72 hours. Thyroid Function Tests: No results for input(s): "TSH", "T4TOTAL", "FREET4", "T3FREE", "THYROIDAB" in the last 72 hours. Anemia Panel: No results for input(s): "VITAMINB12", "FOLATE", "FERRITIN", "TIBC", "IRON", "RETICCTPCT" in the last 72 hours. Sepsis Labs: No results for input(s): "PROCALCITON", "LATICACIDVEN" in the last 168 hours.  No results found for this or any previous visit (from the past 240 hours).   Radiology Studies: No results found.  Scheduled Meds:  morphine  CONCENTRATE  5 mg Sublingual Q6H   mouth rinse  15 mL Mouth Rinse 4 times per day   Continuous Infusions:   LOS: 9 days   Aura Leeds, DO [facility] Available via Epic secure chat 7am-7pm After these hours, please refer to coverage provider listed on [email or web address] 07/17/2023, 12:58 PM

## 2023-07-17 NOTE — Plan of Care (Signed)
  Problem: Clinical Measurements: Goal: Quality of life will improve Outcome: Progressing   Problem: Respiratory: Goal: Verbalizations of increased ease of respirations will increase Outcome: Progressing   Problem: Role Relationship: Goal: Ability to verbalize concerns, feelings, and thoughts to partner or family member will improve Outcome: Not Progressing   Problem: Pain Management: Goal: Satisfaction with pain management regimen will improve Outcome: Progressing   Problem: Clinical Measurements: Goal: Ability to maintain clinical measurements within normal limits will improve Outcome: Not Progressing

## 2023-08-01 NOTE — Progress Notes (Signed)
    Patient Name: [name]           DOB: [birth year]  MRN: [id number]       OVERNIGHT EVENT    Notified by RN that patient has expired at 0523.  2 RN verified. Patient was comfort care.   Family has been notified by RN.    Paisly Fingerhut, DNP, ACNPC- AG [facility] [employer]

## 2023-08-01 NOTE — Death Summary Note (Signed)
 DEATH SUMMARY   Patient Details  Name: [name] MRN: [id number] DOB: [birth year] NAT:[id number], Kristina Pfeiffer, MD Admission/Discharge Information   Admit Date:  07/31/2023  Date of Death: Date of Death: 07/28/2023  Time of Death: Time of Death: 0523  Length of Stay: 10   Principle Cause of death: Acute cardiopulmonary arrest in the setting of cardiogenic shock  Hospital Diagnoses: Principal Problem:   Shock [facility])   Hospital Course: [name] is a 89 y.o. male with past medical history significant for HTN, CAD s/p PCI, dyslipidemia, paroxysmal atrial fibrillation (not on anticoagulation 2/2 high fall risk and recent UGIB), CKD3a, BPH, chronic anemia/thrombocytopenia, recent UGIB 2/2 duodenal ulcer, recent hospitalization for influenza A infection, sinus pause in which beta-blocker was discontinued, moderate protein calorie malnutrition, previous history of tobacco abuse who presented to [facility] ED from SNF for shortness of breath and lethargy.  On arrival patient was noted to be bradycardic, hypotensive and was given atropine  1 mg IV x 1, epinephrine  drip and Levophed  drip was started.  Cardiology was consulted.  PCCM was consulted for admission.   Significant Hospital events: Jul 18, 2023: Admit ICU, cardiology and palliative care consulted 5/9: Son and daughter at bedside requesting transition to comfort care 5/10: Remains unresponsive, no acute distress 5/13: Opens eyes slightly, moving left hand, otherwise minimally responsive 5/14: Continues to remain minimally responsive. Does open eyes and withdraw to pain 5/17: More somnolent and more lethargic  Subsequently as expected the patient decompensated further and passed away after becoming more more lethargic and hypoxic on full comfort measures early morning on 07/28/23; his time of death was 0523 and 2 nurses verified..  Assessment and Plan:   Cardiogenic shock with Lactic Acidosis Severe bradycardia/second-degree type I AVB Acute metabolic  encephalopathy Acute renal failure on CKD stage IIIa Patient presenting from SNF with shortness of breath, lethargy.  Patient was noted to be in cardiogenic shock with severe bradycardia; EKG notable with HR 33 with secondary type I AVB.  Patient was seen by Cardiology, not candidate for pacing/device implantation.  Patient initially received IV atropine  and started on epinephrine /Levophed  drip.  Initially admitted to the intensive care unit under the PCCM service.  Palliative care was consulted and family desired to transition to comfort measures. Currently NPO except Meds and Sips All medications not in the patient's comfort discontinued. Currently has the following Comfort Medications  -IV Hydromorphone  0.5-2 mg IV q2h PRN Moderate/Severe Pain or dyspnea -Lorazepam  1-2 mg PO/IV q4prn for anxiety  -Glycopyrrolate  0.4 mg [place] q4h PRN for excessive secretions -Ondansetron  4 mg po/IV q6prn for nausea -Polyvinyl alcohol   prn for dry eyes and Biotene 15 mL Topically prn Dry mouth -Also has Morphine  Concentrate 5 mg SL q2hprn Severe Pain - Continued morphine  Concentrate 5 mg SL q6h scheduled    Essential HTN CAD s/p PCI Dyslipidemia Atrial fibrillation not on anticoagulation Recent UGIB secondary to duodenal ulcer Anemia of chronic medical disease/Iron Deficiency/Chronic Thrombocytopenia BPH Moderate protein calorie malnutrition History of distant tobacco use disorder Hypokalemia  Hypoalbuminemia: Patient's Albumin  was 2.5. Will not CTM and Trend as patient is CMO  Overweight: Complicates overall prognosis and care. Estimated body mass index is 27.52 kg/m as calculated from the following:   Height as of this encounter: 5\' 8"  (1.727 m).   Weight as of this encounter: 82.1 kg. Weight Loss and Dietary Counseling given  Active Pressure Injury/Wound(s)     Pressure Ulcer  Duration          Pressure Injury 07/20/2023  Sacrum Mid Stage 2 -  Partial thickness loss of dermis presenting as a shallow  open injury with a red, pink wound bed without slough. 11 days           Procedures: As delineated as above  Consultations: Cardiology, PCCM, Palliative Care Medicine  The results of significant diagnostics from this hospitalization (including imaging, microbiology, ancillary and laboratory) are listed below for reference.   Significant Diagnostic Studies: DG Chest Portable 1 View Result Date: 07/08/2023 CLINICAL DATA:  Dyspnea. EXAM: PORTABLE CHEST 1 VIEW COMPARISON:  05/31/2023. FINDINGS: Low lung volumes. Cardiomediastinal silhouette is otherwise unchanged. Aortic atherosclerosis. Left basilar opacity with mild blunting of the left costophrenic angle. The right lung appears clear. No pneumothorax. Degenerative changes of the right glenohumeral joint. No acute osseous abnormality. IMPRESSION: Low lung volumes. Left basilar opacity with mild blunting of the left costophrenic angle could reflect atelectasis or infiltrate with small left pleural effusion. Electronically Signed   By: Mannie Seek M.D.   On: 07/08/2023 12:54    Microbiology: No results found for this or any previous visit (from the past 240 hours).  Time spent: 30 minutes  Signed: Aura Leeds, DO [facility] 2023/07/24

## 2023-08-01 DEATH — deceased
